# Patient Record
Sex: Male | Born: 1935
Health system: Southern US, Community
[De-identification: ages and names within clinical notes are randomized; demographics above are authoritative.]

## PROBLEM LIST (undated history)

## (undated) DIAGNOSIS — I4891 Unspecified atrial fibrillation: Secondary | ICD-10-CM

## (undated) DIAGNOSIS — E785 Hyperlipidemia, unspecified: Secondary | ICD-10-CM

## (undated) DIAGNOSIS — I350 Nonrheumatic aortic (valve) stenosis: Secondary | ICD-10-CM

## (undated) DIAGNOSIS — C61 Malignant neoplasm of prostate: Secondary | ICD-10-CM

## (undated) DIAGNOSIS — R011 Cardiac murmur, unspecified: Secondary | ICD-10-CM

## (undated) DIAGNOSIS — R7989 Other specified abnormal findings of blood chemistry: Secondary | ICD-10-CM

## (undated) DIAGNOSIS — E119 Type 2 diabetes mellitus without complications: Secondary | ICD-10-CM

## (undated) DIAGNOSIS — N319 Neuromuscular dysfunction of bladder, unspecified: Secondary | ICD-10-CM

## (undated) DIAGNOSIS — E539 Vitamin B deficiency, unspecified: Secondary | ICD-10-CM

## (undated) DIAGNOSIS — R600 Localized edema: Secondary | ICD-10-CM

## (undated) DIAGNOSIS — M545 Low back pain: Secondary | ICD-10-CM

## (undated) DIAGNOSIS — E782 Mixed hyperlipidemia: Secondary | ICD-10-CM

## (undated) HISTORY — DX: Neuromuscular dysfunction of bladder, unspecified: N31.9

## (undated) HISTORY — DX: Nonrheumatic aortic (valve) stenosis: I35.0

## (undated) HISTORY — DX: Mixed hyperlipidemia: E78.2

## (undated) HISTORY — DX: Low back pain: M54.5

## (undated) HISTORY — DX: Vitamin B deficiency, unspecified: E53.9

## (undated) HISTORY — PX: RADIAL HEAD ARTHROPLASTY: SUR75

## (undated) HISTORY — PX: ORIF HUMERUS FRACTURE: SHX2126

## (undated) HISTORY — DX: Localized edema: R60.0

## (undated) HISTORY — DX: Hyperlipidemia, unspecified: E78.5

## (undated) HISTORY — DX: Cardiac murmur, unspecified: R01.1

## (undated) HISTORY — DX: Other specified abnormal findings of blood chemistry: R79.89

---

## 1999-07-29 ENCOUNTER — Encounter: Payer: Self-pay | Admitting: Endocrinology

## 1999-07-29 ENCOUNTER — Encounter: Admission: RE | Admit: 1999-07-29 | Discharge: 1999-07-29 | Payer: Self-pay | Admitting: Endocrinology

## 1999-10-20 ENCOUNTER — Encounter (INDEPENDENT_AMBULATORY_CARE_PROVIDER_SITE_OTHER): Payer: Self-pay | Admitting: Specialist

## 1999-10-20 ENCOUNTER — Other Ambulatory Visit: Admission: RE | Admit: 1999-10-20 | Discharge: 1999-10-20 | Payer: Self-pay | Admitting: Urology

## 2007-05-30 ENCOUNTER — Inpatient Hospital Stay (HOSPITAL_COMMUNITY): Admission: RE | Admit: 2007-05-30 | Discharge: 2007-06-04 | Payer: Self-pay | Admitting: Orthopaedic Surgery

## 2007-07-17 ENCOUNTER — Encounter: Admission: RE | Admit: 2007-07-17 | Discharge: 2007-07-30 | Payer: Self-pay | Admitting: Orthopaedic Surgery

## 2010-05-18 NOTE — Discharge Summary (Signed)
NAME:  Parker [name], Parker [name]                  ACCOUNT NO.:  [id number]   MEDICAL RECORD NO.:  [id number]          PATIENT TYPE:  INP   LOCATION:  5157                         FACILITY:  [facility]   PHYSICIAN:  Mark C. Ophelia Charter, M.D.    DATE OF BIRTH:  [birth year]   DATE OF ADMISSION:  05/30/2007  DATE OF DISCHARGE:  06/04/2007                               DISCHARGE SUMMARY   TRANSFER SUMMARY   PRIMARY CARE PHYSICIAN:  Alfonse Alpers. Gegick, MD   FINAL DIAGNOSES:  1. Bilateral upper extremity fractures with right distal radius      fracture, status post reduction.  2. Left transcondylar humerus fracture with intercondylar extension      and comminuted radial head fracture, status post open reduction and      internal fixation of distal humerus with lateral plating and radial      head replacement arthroplasty, it was on May 30, 2007.   ADDITIONAL DIAGNOSES:  1. History of neurogenic bladder with home self-catheterization      program.  2. Likely urinary tract infection on admission.  3. Type 2 diabetes, diet-controlled.   This is an 77 year old male, tripped in the rain, trying to hurry to get  out of the rain [place] was [place].  He tends  to go with a shuffled gait.  He was treated in [place], [place],  where x-rays demonstrated distal radius fracture, which was reduced,  placed in a sugar-tong and he was placed in a posterior splint after CT  scan showed comminution of the distal humerus, comminution of the radial  head.  His son knows me and arranged for him to be seen in my office.   ADMISSION MEDICATIONS:  1. Lupron every 4 months.  2. Actos 45 mg daily.  3. Crestor 5 mg daily.  4. Doxazosin 2 mg p.o. daily.   HOSPITAL COURSE:  The patient was seen in my office and admitted for  surgery.  He underwent lateral plating on the left distal humerus with  fixation of interfragmentary component of the fracture and a radial head  arthroplasty.  Because the patient  had been on home self-cath program, a  Foley catheter was placed since he had involvement of both upper  extremities.  He was treated with arm elevation.  Neurologic exam after  his surgery on the left was stable.  No surgical treatment was performed  on the right since x-rays in the office demonstrate satisfactory  position and alignment.  He is moving his fingers well.  Radial, median,  and ulnar nerves are working well.  X-ray showed good position and  alignment of the radial head prosthesis.  Arrangements were made for  transfer to nursing home since he lives alone until he is able to resume  self-cath program and activities of daily living.  Brace is ordered for  range of motion from 30-120 degrees hinge towards elbow.  Right distal  radius fracture was in a sugar-tong, and he is to be seen in the office  on June 05, 2007, for repeat  x-rays and probable short arm fiberglass  cast application.   LABORATORY DATA:  UA and C&S, which were done on June 04, 2007, and those  results are pending.   CONDITION AT TRANSFER:  Satisfactory.   FINAL DIAGNOSES:  1. Bilateral upper extremity fractures.  2. Neurogenic bladder.  3. Diabetes.  4. Probable urinary tract infection.      Mark C. Ophelia Charter, M.D.  Electronically Signed     MCY/MEDQ  D:  06/04/2007  T:  06/04/2007  Job:  [id number]   cc:   Alfonse Alpers. Dagoberto Ligas, M.D.

## 2010-05-18 NOTE — Op Note (Signed)
NAME:  Parker [name], Parker [name]                  ACCOUNT NO.:  [id number]   MEDICAL RECORD NO.:  [id number]          PATIENT TYPE:  OIB   LOCATION:  5157                         FACILITY:  [facility]   PHYSICIAN:  Mark C. Ophelia Charter, M.D.    DATE OF BIRTH:  January 20, 1923   DATE OF PROCEDURE:  05/30/2007  DATE OF DISCHARGE:                               OPERATIVE REPORT   PREOPERATIVE DIAGNOSES:  1. Left transcondylar distal humerus fracture with supracondylar      extension.  2. Comminuted radial head fracture.   POSTOPERATIVE DIAGNOSES:  1. Left transcondylar distal humerus fracture with supracondylar      extension.  2. Comminuted radial head fracture.   PROCEDURES:  1. Open reduction and internal fixation of transcondylar distal      humerus fracture with intercondylar extension.  2. Radial head arthroplasty.   SURGEON:  Mark C. Ophelia Charter, MD   ANESTHESIA:  GOT plus Marcaine skin local 10 mL.   TOURNIQUET TIME:  One hour and 30 minutes.   DRAINS:  One Hemovac.   BRIEF HISTORY:  This 77 year old male lives up in [place]  and was  trying to run to get out of the rain.  He normally has a shuffled gait,  tripped and fell breaking his right distal radius, which [place] with good position alignment.  On the left side, he had  a distal humerus fracture with the capitellum being a separate piece and  extension over to the trochlea and separate pieces of trochlear groove  with extension along the lateral condylar ridge to the supracondylar  region.  Radial head was in multiple pieces with depression of about 8  mm.   PROCEDURES:  After induction of general anesthesia, orotracheal  intubation, proximal arm tourniquet, preoperative 2 g of Ancef  prophylaxis and prepped with DuraPrep from the fingertips up to the  tourniquet using extremity sheets, drapes, and towel clip and extremity  drape was applied.  Sterile skin marker was applied for a lateral elbow  incision and  Betadine Biodrape was applied.  Time-out checklist was  performed.  Arm was wrapped in Esmarch tourniquet inflated to 250 mmHg.  Incision was made laterally following the lateral humeral ridge to the  lateral epicondyle and then over the radial head splitting the muscle in  line with the radial head.  Radial head fragment was medially  identified.  Synovium was gently retracted.  The wound is irrigated and  there was half the radial head which was not depressed and this was used  for appropriate orientation alignment to resect the radial head, taking  about 10 mm.  This got down to the area below the head on the neck where  there was no fracture involvement.  This was resected.  The joint was  exposed.  Anterior synovium was divided and narrow small Hohmann was  placed across anterior aspect of the joint with elbow flexed and the  joint was well visualized.  Capitellar piece was rotated anteriorly,  flipped and this was meticulously cleaned along the edges and then  reduced.  There were some involvement of the trochlear groove and  extension went across transcondylar to the opposite side.  Once the  capitellar pieces were reduced, 2 K-wires were placed and then two 4-0  Synthes cortical screws were lagged across compressing this and  extending all the way across to the medial epicondyle.  Screw measured  exactly 50 and there was concern about the ulnar nerve in the groove,  great care was taken at the screw just partially went to the cortex and  screw was used to push through and there was a careful visualization  under fluoroscopy and the screw did not penetrate into the groove, but  did just come to the cortex and appeared to be flushed with cortex.  Palpation of the groove with the depth gauge where the ulnar nerve was  located as well as where the screw was being inserted was performed.  With these 2 screws placed, an Acumed anatomic lateral condylar plate  blue 6-hole was selected,  and screws were placed with proximal locking  screws distal nonlocking screws, so that they could be angled to grab  the capitellar fragment.  Distalmost screw in the lateral plate appeared  to penetrate the joint by x-ray and the anterior aspect of the humerus  was carefully inspected.  There was no penetration of the joint.  Alignment was satisfactory.  Elbow was taken through flexion and  extension.  There was no crepitus and there was direct visualization of  the anterior aspect of the distal humerus completely across.  Palpation  of the fingertip was performed as well.  Satisfied all screws were in  good position, radial head was then broached, tensioned, and appeared  that an 8 tension with a 26-mm head was appropriate.  The patient's head  would not quite fit into the 26 slot, so the 26 is slightly smaller than  the patient's own radial head.  After appropriate broaching for any  stem, the prosthesis was inserted and popped into place with some  difficulty.  Flexion, extension and rotation showed good stability.  After irrigation, the extensors were repaired.  Capsule was repaired.  Extensor origin was repaired using 0 Vicryl figure-of-eight interrupted,  2-0 Vicryl for subcutaneous tissue, after Hemovac was placed and a skin  staple closure, Marcaine infiltration both into the elbow joint 8 mL as  well as into the skin.  Xeroform, 4 x 4s, sterile Webril was applied.  The laser score mark on the prosthesis was carefully lined up with  Lister's tubercle at the time of insertion, and the neck cut had been  reamed with the calcar-type reamer.  The patient tolerated the procedure  well and transferred to the recovery room in stable condition.      Mark C. Ophelia Charter, M.D.  Electronically Signed     MCY/MEDQ  D:  05/30/2007  T:  05/31/2007  Job:  [id number]

## 2010-09-29 LAB — BASIC METABOLIC PANEL
BUN: 18
CO2: 24
Calcium: 8.7
Chloride: 103
Creatinine, Ser: 0.76
GFR calc Af Amer: >60
GFR calc non Af Amer: >60
Glucose, Bld: 115 — ABNORMAL HIGH
Potassium: 4
Sodium: 136

## 2010-09-29 LAB — CBC
HCT: 36.9 — ABNORMAL LOW
Hemoglobin: 12.5 — ABNORMAL LOW
MCHC: 34
MCV: 95.1
Platelets: 139 — ABNORMAL LOW
RBC: 3.88 — ABNORMAL LOW
RDW: 14.7
WBC: 11 — ABNORMAL HIGH

## 2010-09-29 LAB — URINE MICROSCOPIC-ADD ON

## 2010-09-29 LAB — DIFFERENTIAL
Basophils Relative: 0
Eosinophils Absolute: 0
Eosinophils Relative: 0
Monocytes Absolute: 1.1 — ABNORMAL HIGH
Monocytes Relative: 10
Neutrophils Relative %: 82 — ABNORMAL HIGH

## 2010-09-29 LAB — URINALYSIS, ROUTINE W REFLEX MICROSCOPIC
Bilirubin Urine: NEGATIVE
Glucose, UA: NEGATIVE
Specific Gravity, Urine: 1.025
pH: 5

## 2010-09-30 LAB — URINALYSIS, ROUTINE W REFLEX MICROSCOPIC
Bilirubin Urine: NEGATIVE
Nitrite: POSITIVE — AB
Specific Gravity, Urine: 1.01
pH: 7.5

## 2010-09-30 LAB — URINE MICROSCOPIC-ADD ON

## 2010-09-30 LAB — URINE CULTURE

## 2014-01-06 DIAGNOSIS — M6281 Muscle weakness (generalized): Secondary | ICD-10-CM | POA: Diagnosis not present

## 2014-01-06 DIAGNOSIS — R296 Repeated falls: Secondary | ICD-10-CM | POA: Diagnosis not present

## 2014-01-06 DIAGNOSIS — R278 Other lack of coordination: Secondary | ICD-10-CM | POA: Diagnosis not present

## 2014-01-06 DIAGNOSIS — R260 Ataxic gait: Secondary | ICD-10-CM | POA: Diagnosis not present

## 2014-01-08 DIAGNOSIS — R296 Repeated falls: Secondary | ICD-10-CM | POA: Diagnosis not present

## 2014-01-08 DIAGNOSIS — R260 Ataxic gait: Secondary | ICD-10-CM | POA: Diagnosis not present

## 2014-01-08 DIAGNOSIS — M6281 Muscle weakness (generalized): Secondary | ICD-10-CM | POA: Diagnosis not present

## 2014-01-08 DIAGNOSIS — R278 Other lack of coordination: Secondary | ICD-10-CM | POA: Diagnosis not present

## 2014-01-13 DIAGNOSIS — R260 Ataxic gait: Secondary | ICD-10-CM | POA: Diagnosis not present

## 2014-01-13 DIAGNOSIS — C61 Malignant neoplasm of prostate: Secondary | ICD-10-CM | POA: Diagnosis not present

## 2014-01-13 DIAGNOSIS — R278 Other lack of coordination: Secondary | ICD-10-CM | POA: Diagnosis not present

## 2014-01-13 DIAGNOSIS — M6281 Muscle weakness (generalized): Secondary | ICD-10-CM | POA: Diagnosis not present

## 2014-01-13 DIAGNOSIS — N312 Flaccid neuropathic bladder, not elsewhere classified: Secondary | ICD-10-CM | POA: Diagnosis not present

## 2014-01-13 DIAGNOSIS — R296 Repeated falls: Secondary | ICD-10-CM | POA: Diagnosis not present

## 2014-01-15 DIAGNOSIS — M6281 Muscle weakness (generalized): Secondary | ICD-10-CM | POA: Diagnosis not present

## 2014-01-15 DIAGNOSIS — R260 Ataxic gait: Secondary | ICD-10-CM | POA: Diagnosis not present

## 2014-01-15 DIAGNOSIS — R278 Other lack of coordination: Secondary | ICD-10-CM | POA: Diagnosis not present

## 2014-01-15 DIAGNOSIS — R296 Repeated falls: Secondary | ICD-10-CM | POA: Diagnosis not present

## 2014-01-21 DIAGNOSIS — R260 Ataxic gait: Secondary | ICD-10-CM | POA: Diagnosis not present

## 2014-01-21 DIAGNOSIS — M6281 Muscle weakness (generalized): Secondary | ICD-10-CM | POA: Diagnosis not present

## 2014-01-21 DIAGNOSIS — R296 Repeated falls: Secondary | ICD-10-CM | POA: Diagnosis not present

## 2014-01-21 DIAGNOSIS — R278 Other lack of coordination: Secondary | ICD-10-CM | POA: Diagnosis not present

## 2014-02-26 DIAGNOSIS — R339 Retention of urine, unspecified: Secondary | ICD-10-CM | POA: Diagnosis not present

## 2014-03-03 DIAGNOSIS — Z1389 Encounter for screening for other disorder: Secondary | ICD-10-CM | POA: Diagnosis not present

## 2014-03-03 DIAGNOSIS — Z0000 Encounter for general adult medical examination without abnormal findings: Secondary | ICD-10-CM | POA: Diagnosis not present

## 2014-03-03 DIAGNOSIS — E785 Hyperlipidemia, unspecified: Secondary | ICD-10-CM | POA: Diagnosis not present

## 2014-03-03 DIAGNOSIS — E538 Deficiency of other specified B group vitamins: Secondary | ICD-10-CM | POA: Diagnosis not present

## 2014-03-03 DIAGNOSIS — E139 Other specified diabetes mellitus without complications: Secondary | ICD-10-CM | POA: Diagnosis not present

## 2014-03-03 DIAGNOSIS — Z23 Encounter for immunization: Secondary | ICD-10-CM | POA: Diagnosis not present

## 2014-03-03 DIAGNOSIS — R296 Repeated falls: Secondary | ICD-10-CM | POA: Diagnosis not present

## 2014-04-16 DIAGNOSIS — H02105 Unspecified ectropion of left lower eyelid: Secondary | ICD-10-CM | POA: Diagnosis not present

## 2014-04-16 DIAGNOSIS — H02102 Unspecified ectropion of right lower eyelid: Secondary | ICD-10-CM | POA: Diagnosis not present

## 2014-05-19 DIAGNOSIS — N312 Flaccid neuropathic bladder, not elsewhere classified: Secondary | ICD-10-CM | POA: Diagnosis not present

## 2014-05-19 DIAGNOSIS — C61 Malignant neoplasm of prostate: Secondary | ICD-10-CM | POA: Diagnosis not present

## 2014-07-27 DIAGNOSIS — R52 Pain, unspecified: Secondary | ICD-10-CM | POA: Diagnosis not present

## 2014-07-27 DIAGNOSIS — T148 Other injury of unspecified body region: Secondary | ICD-10-CM | POA: Diagnosis not present

## 2014-07-28 ENCOUNTER — Emergency Department (HOSPITAL_COMMUNITY)
Admission: EM | Admit: 2014-07-28 | Discharge: 2014-07-28 | Disposition: A | Discharge status: Home / Self Care | Payer: Commercial Managed Care - HMO | Attending: Emergency Medicine | Admitting: Emergency Medicine

## 2014-07-28 ENCOUNTER — Emergency Department (HOSPITAL_COMMUNITY): Payer: Commercial Managed Care - HMO

## 2014-07-28 ENCOUNTER — Encounter (HOSPITAL_COMMUNITY): Payer: Self-pay | Admitting: Emergency Medicine

## 2014-07-28 DIAGNOSIS — R410 Disorientation, unspecified: Secondary | ICD-10-CM | POA: Insufficient documentation

## 2014-07-28 DIAGNOSIS — Z043 Encounter for examination and observation following other accident: Secondary | ICD-10-CM | POA: Insufficient documentation

## 2014-07-28 DIAGNOSIS — Y999 Unspecified external cause status: Secondary | ICD-10-CM | POA: Diagnosis not present

## 2014-07-28 DIAGNOSIS — E119 Type 2 diabetes mellitus without complications: Secondary | ICD-10-CM | POA: Insufficient documentation

## 2014-07-28 DIAGNOSIS — W1839XA Other fall on same level, initial encounter: Secondary | ICD-10-CM | POA: Insufficient documentation

## 2014-07-28 DIAGNOSIS — N390 Urinary tract infection, site not specified: Secondary | ICD-10-CM | POA: Diagnosis not present

## 2014-07-28 DIAGNOSIS — W19XXXA Unspecified fall, initial encounter: Secondary | ICD-10-CM

## 2014-07-28 DIAGNOSIS — Z87891 Personal history of nicotine dependence: Secondary | ICD-10-CM | POA: Diagnosis not present

## 2014-07-28 DIAGNOSIS — Z8546 Personal history of malignant neoplasm of prostate: Secondary | ICD-10-CM | POA: Insufficient documentation

## 2014-07-28 DIAGNOSIS — S199XXA Unspecified injury of neck, initial encounter: Secondary | ICD-10-CM | POA: Diagnosis not present

## 2014-07-28 DIAGNOSIS — Y939 Activity, unspecified: Secondary | ICD-10-CM | POA: Insufficient documentation

## 2014-07-28 DIAGNOSIS — Y9289 Other specified places as the place of occurrence of the external cause: Secondary | ICD-10-CM | POA: Diagnosis not present

## 2014-07-28 DIAGNOSIS — S0990XA Unspecified injury of head, initial encounter: Secondary | ICD-10-CM | POA: Diagnosis not present

## 2014-07-28 HISTORY — DX: Type 2 diabetes mellitus without complications: E11.9

## 2014-07-28 HISTORY — DX: Malignant neoplasm of prostate: C61

## 2014-07-28 LAB — URINALYSIS, ROUTINE W REFLEX MICROSCOPIC
Bilirubin Urine: NEGATIVE
Glucose, UA: NEGATIVE mg/dL
Ketones, ur: NEGATIVE mg/dL
NITRITE: POSITIVE — AB
PH: 5 (ref 5.0–8.0)
Protein, ur: NEGATIVE mg/dL
SPECIFIC GRAVITY, URINE: 1.009 (ref 1.005–1.030)
UROBILINOGEN UA: 0.2 mg/dL (ref 0.0–1.0)

## 2014-07-28 LAB — COMPREHENSIVE METABOLIC PANEL
ALBUMIN: 3.8 g/dL (ref 3.5–5.0)
ALK PHOS: 50 U/L (ref 38–126)
ALT: 9 U/L — AB (ref 17–63)
ANION GAP: 15 (ref 5–15)
AST: 19 U/L (ref 15–41)
BILIRUBIN TOTAL: 0.2 mg/dL — AB (ref 0.3–1.2)
BUN: 23 mg/dL — ABNORMAL HIGH (ref 6–20)
CALCIUM: 8.7 mg/dL — AB (ref 8.9–10.3)
CHLORIDE: 100 mmol/L — AB (ref 101–111)
CO2: 23 mmol/L (ref 22–32)
Creatinine, Ser: 0.92 mg/dL (ref 0.61–1.24)
GFR calc Af Amer: >60 mL/min (ref >60)
GLUCOSE: 122 mg/dL — AB (ref 65–99)
Potassium: 3.7 mmol/L (ref 3.5–5.1)
SODIUM: 138 mmol/L (ref 135–145)
TOTAL PROTEIN: 6.6 g/dL (ref 6.5–8.1)

## 2014-07-28 LAB — URINE MICROSCOPIC-ADD ON

## 2014-07-28 MED ORDER — CEPHALEXIN 250 MG PO CAPS: 250.0000 mg | ORAL_CAPSULE | Freq: Four times a day (QID) | ORAL | Status: DC

## 2014-07-28 NOTE — ED Notes (Addendum)
Per EMS unobserved fall. Pt coming from [facility], staff told son he should be sent out for evaluation and that he might have a UTI. Per EMS resident has AMS but he is alert for his norm. VSS. SOn states pt is usually A&Ox4. PT placed in C collar by EMS. PT normally caths himself.

## 2014-07-28 NOTE — ED Provider Notes (Signed)
CSN: [id number]   Arrival date & time 07/28/14 0013  History  This chart was scribed for  Parker Rice, MD by Parker [name], ED Scribe. This patient was seen in room D32C/D32C and the patient's care was started at 12:36 AM.  Chief Complaint  Patient presents with  . Fall    HPI The history is provided by the patient, the EMS personnel and a relative. No language interpreter was used.  Level V caveat due to AMS.  Brought in by EMS from [facility], Parker [name] is a 81 y.o. male who presents to the Emergency Department complaining of an unwitnessed fall tonight. The pt "kind of" remembers falling. Per EMS the pt is at baseline mental status. Pt denies any pain. No anticoagulation per son.   Past Medical History  Diagnosis Date  . Prostate cancer   . Diabetes mellitus without complication     History reviewed. No pertinent past surgical history.  History reviewed. No pertinent family history.  History  Substance Use Topics  . Smoking status: Former Research scientist (life sciences)  . Smokeless tobacco: Not on file  . Alcohol Use: No     Review of Systems  Respiratory: Negative for shortness of breath.   Cardiovascular: Negative for chest pain.  Gastrointestinal: Negative for abdominal pain.  Musculoskeletal: Negative for arthralgias and neck pain.  Skin: Negative for wound.  Psychiatric/Behavioral: Positive for confusion.  All other systems reviewed and are negative.    Home Medications   Prior to Admission medications   Medication Sig Start Date End Date Taking? Authorizing Provider  cephALEXin (KEFLEX) 250 MG capsule Take 1 capsule (250 mg total) by mouth 4 (four) times daily. 07/28/14   Parker Rice, MD    Allergies  Review of patient's allergies indicates no known allergies.  Triage Vitals: BP 143/70 mmHg  Pulse 72  Resp 25  Ht 6' (1.829 m)  Wt 180 lb (81.647 kg)  BMI 24.41 kg/m2  SpO2 95%  Physical Exam  Constitutional: He appears well-developed and  well-nourished. No distress.  HENT:  Head: Normocephalic and atraumatic.  Mouth/Throat: Oropharynx is clear and moist.  No obvious injury.  Eyes: EOM are normal. Pupils are equal, round, and reactive to light.  Neck: Normal range of motion. Neck supple.  No posterior midline cervical tenderness to palpation.  Cardiovascular: Normal rate and regular rhythm.   Pulmonary/Chest: Effort normal and breath sounds normal. No respiratory distress. He has no wheezes. He has no rales.  Abdominal: Soft. Bowel sounds are normal.  Musculoskeletal: Normal range of motion. He exhibits no edema or tenderness.  Neurological: He is alert.  Oriented to person. Moves all extremities without deficit. Sensation is intact.  Skin: Skin is warm and dry. No rash noted. No erythema.  Psychiatric: He has a normal mood and affect. His behavior is normal.  Nursing note and vitals reviewed.   ED Course  Procedures   DIAGNOSTIC STUDIES: Oxygen Saturation is 95% on RA, normal by my interpretation.    COORDINATION OF CARE: 12:42 AM Discussed treatment plan with pt at bedside and pt agreed to plan.  3:13 AM I re-evaluated the patient and provided an update on the results of his CT scans and labs. His son is at the bedside and notes that his father seems to be at his baseline mental status. He has had a UTI in the last year.   Labs Review-  Labs Reviewed  COMPREHENSIVE METABOLIC PANEL - Abnormal; Notable for the following:    Chloride  100 (*)    Glucose, Bld 122 (*)    BUN 23 (*)    Calcium 8.7 (*)    ALT 9 (*)    Total Bilirubin 0.2 (*)    All other components within normal limits  URINALYSIS, ROUTINE W REFLEX MICROSCOPIC (NOT AT [facility]) - Abnormal; Notable for the following:    APPearance CLOUDY (*)    Hgb urine dipstick TRACE (*)    Nitrite POSITIVE (*)    Leukocytes, UA LARGE (*)    All other components within normal limits  URINE MICROSCOPIC-ADD ON - Abnormal; Notable for the following:    Bacteria, UA  FEW (*)    All other components within normal limits  CBC WITH DIFFERENTIAL/PLATELET    Imaging Review Ct Head Wo Contrast  07/28/2014   CLINICAL DATA:  81 year old male post un-observed fall.  EXAM: CT HEAD WITHOUT CONTRAST  CT CERVICAL SPINE WITHOUT CONTRAST  TECHNIQUE: Multidetector CT imaging of the head and cervical spine was performed following the standard protocol without intravenous contrast. Multiplanar CT image reconstructions of the cervical spine were also generated.  COMPARISON:  None.  FINDINGS: CT HEAD FINDINGS  Generalized cerebral atrophy. Moderate chronic small vessel ischemia, asymmetrically increased in the right frontal lobe. No intracranial hemorrhage, mass effect, or midline shift. No hydrocephalus. The basilar cisterns are patent. No evidence of territorial infarct. No intracranial fluid collection. Calvarium is intact. Included paranasal sinuses and mastoid air cells are well aerated.  CT CERVICAL SPINE FINDINGS  Cervical spine alignment is maintained. Vertebral body heights are preserved. There is no fracture. The dens is intact. There are no jumped or perched facets. Disc space narrowing at C5-C6 and C6-C7 with associated endplate spurs. The bones are under mineralized. No prevertebral soft tissue edema. Atherosclerosis of the carotid vasculature, as well as aortic arch and its branches, partially included.  IMPRESSION: 1. Generalized atrophy and chronic small vessel ischemic change without CT findings of acute intracranial abnormality. 2. Mild degenerative change in the cervical spine, no fracture or subluxation.   Electronically Signed   By: [name] M.D.   On: 07/28/2014 02:00   Ct Cervical Spine Wo Contrast  07/28/2014   CLINICAL DATA:  81 year old male post un-observed fall.  EXAM: CT HEAD WITHOUT CONTRAST  CT CERVICAL SPINE WITHOUT CONTRAST  TECHNIQUE: Multidetector CT imaging of the head and cervical spine was performed following the standard protocol without  intravenous contrast. Multiplanar CT image reconstructions of the cervical spine were also generated.  COMPARISON:  None.  FINDINGS: CT HEAD FINDINGS  Generalized cerebral atrophy. Moderate chronic small vessel ischemia, asymmetrically increased in the right frontal lobe. No intracranial hemorrhage, mass effect, or midline shift. No hydrocephalus. The basilar cisterns are patent. No evidence of territorial infarct. No intracranial fluid collection. Calvarium is intact. Included paranasal sinuses and mastoid air cells are well aerated.  CT CERVICAL SPINE FINDINGS  Cervical spine alignment is maintained. Vertebral body heights are preserved. There is no fracture. The dens is intact. There are no jumped or perched facets. Disc space narrowing at C5-C6 and C6-C7 with associated endplate spurs. The bones are under mineralized. No prevertebral soft tissue edema. Atherosclerosis of the carotid vasculature, as well as aortic arch and its branches, partially included.  IMPRESSION: 1. Generalized atrophy and chronic small vessel ischemic change without CT findings of acute intracranial abnormality. 2. Mild degenerative change in the cervical spine, no fracture or subluxation.   Electronically Signed   By: [name] M.D.   On:  07/28/2014 02:00    EKG Interpretation  Date/Time:    Ventricular Rate:    PR Interval:    QRS Duration:   QT Interval:    QTC Calculation:   R Axis:     Text Interpretation:         MDM   Final diagnoses:  Fall, initial encounter  UTI (lower urinary tract infection)     I personally performed the services described in this documentation, which was scribed in my presence. The recorded information has been reviewed and is accurate.  Patient has at his baseline. No obvious injury on CT head and cervical spine. We'll treat for urinary tract infection. Return precautions given.    Parker Rice, MD 07/29/14 [phone]

## 2014-07-28 NOTE — Discharge Instructions (Signed)
Fall Prevention and Home Safety Falls cause injuries and can affect all age groups. It is possible to use preventive measures to significantly decrease the likelihood of falls. There are many simple measures which can make your home safer and prevent falls. OUTDOORS  Repair cracks and edges of walkways and driveways.  Remove high doorway thresholds.  Trim shrubbery on the main path into your home.  Have good outside lighting.  Clear walkways of tools, rocks, debris, and clutter.  Check that handrails are not broken and are securely fastened. Both sides of steps should have handrails.  Have leaves, snow, and ice cleared regularly.  Use sand or salt on walkways during winter months.  In the garage, clean up grease or oil spills. BATHROOM  Install night lights.  Install grab bars by the toilet and in the tub and shower.  Use non-skid mats or decals in the tub or shower.  Place a plastic non-slip stool in the shower to sit on, if needed.  Keep floors dry and clean up all water on the floor immediately.  Remove soap buildup in the tub or shower on a regular basis.  Secure bath mats with non-slip, double-sided rug tape.  Remove throw rugs and tripping hazards from the floors. BEDROOMS  Install night lights.  Make sure a bedside light is easy to reach.  Do not use oversized bedding.  Keep a telephone by your bedside.  Have a firm chair with side arms to use for getting dressed.  Remove throw rugs and tripping hazards from the floor. KITCHEN  Keep handles on pots and pans turned toward the center of the stove. Use back burners when possible.  Clean up spills quickly and allow time for drying.  Avoid walking on wet floors.  Avoid hot utensils and knives.  Position shelves so they are not too high or low.  Place commonly used objects within easy reach.  If necessary, use a sturdy step stool with a grab bar when reaching.  Keep electrical cables out of the  way.  Do not use floor polish or wax that makes floors slippery. If you must use wax, use non-skid floor wax.  Remove throw rugs and tripping hazards from the floor. STAIRWAYS  Never leave objects on stairs.  Place handrails on both sides of stairways and use them. Fix any loose handrails. Make sure handrails on both sides of the stairways are as long as the stairs.  Check carpeting to make sure it is firmly attached along stairs. Make repairs to worn or loose carpet promptly.  Avoid placing throw rugs at the top or bottom of stairways, or properly secure the rug with carpet tape to prevent slippage. Get rid of throw rugs, if possible.  Have an electrician put in a light switch at the top and bottom of the stairs. OTHER FALL PREVENTION TIPS  Wear low-heel or rubber-soled shoes that are supportive and fit well. Wear closed toe shoes.  When using a stepladder, make sure it is fully opened and both spreaders are firmly locked. Do not climb a closed stepladder.  Add color or contrast paint or tape to grab bars and handrails in your home. Place contrasting color strips on first and last steps.  Learn and use mobility aids as needed. Install an electrical emergency response system.  Turn on lights to avoid dark areas. Replace light bulbs that burn out immediately. Get light switches that glow.  Arrange furniture to create clear pathways. Keep furniture in the same place.  Firmly attach carpet with non-skid or double-sided tape.  Eliminate uneven floor surfaces.  Select a carpet pattern that does not visually hide the edge of steps.  Be aware of all pets. OTHER HOME SAFETY TIPS  Set the water temperature for 120 F (48.8 C).  Keep emergency numbers on or near the telephone.  Keep smoke detectors on every level of the home and near sleeping areas. Document Released: 12/10/2001 Document Revised: 06/21/2011 Document Reviewed: 03/11/2011 [facility] Patient Information 2015  [place], [place]. This information is not intended to replace advice given to you by your health care provider. Make sure you discuss any questions you have with your health care provider.  Urinary Tract Infection A urinary tract infection (UTI) can occur any place along the urinary tract. The tract includes the kidneys, ureters, bladder, and urethra. A type of germ called bacteria often causes a UTI. UTIs are often helped with antibiotic medicine.  HOME CARE   If given, take antibiotics as told by your doctor. Finish them even if you start to feel better.  Drink enough fluids to keep your pee (urine) clear or pale yellow.  Avoid tea, drinks with caffeine, and bubbly (carbonated) drinks.  Pee often. Avoid holding your pee in for a long time.  Pee before and after having sex (intercourse).  Wipe from front to back after you poop (bowel movement) if you are a woman. Use each tissue only once. GET HELP RIGHT AWAY IF:   You have back pain.  You have lower belly (abdominal) pain.  You have chills.  You feel sick to your stomach (nauseous).  You throw up (vomit).  Your burning or discomfort with peeing does not go away.  You have a fever.  Your symptoms are not better in 3 days. MAKE SURE YOU:   Understand these instructions.  Will watch your condition.  Will get help right away if you are not doing well or get worse. Document Released: 06/08/2007 Document Revised: 09/14/2011 Document Reviewed: 07/21/2011 [facility] Patient Information 2015 [place], [place]. This information is not intended to replace advice given to you by your health care provider. Make sure you discuss any questions you have with your health care provider.

## 2014-09-01 DIAGNOSIS — R600 Localized edema: Secondary | ICD-10-CM | POA: Insufficient documentation

## 2014-09-01 DIAGNOSIS — R7989 Other specified abnormal findings of blood chemistry: Secondary | ICD-10-CM | POA: Insufficient documentation

## 2014-09-01 DIAGNOSIS — E8770 Fluid overload, unspecified: Secondary | ICD-10-CM | POA: Insufficient documentation

## 2014-09-01 DIAGNOSIS — E119 Type 2 diabetes mellitus without complications: Secondary | ICD-10-CM | POA: Diagnosis not present

## 2014-09-01 HISTORY — DX: Other specified abnormal findings of blood chemistry: R79.89

## 2014-09-05 ENCOUNTER — Other Ambulatory Visit (HOSPITAL_COMMUNITY): Payer: Self-pay | Admitting: Internal Medicine

## 2014-09-05 DIAGNOSIS — R011 Cardiac murmur, unspecified: Secondary | ICD-10-CM

## 2014-09-15 ENCOUNTER — Ambulatory Visit (HOSPITAL_COMMUNITY): Payer: Commercial Managed Care - HMO | Attending: Cardiovascular Disease

## 2014-09-15 ENCOUNTER — Other Ambulatory Visit: Payer: Self-pay

## 2014-09-15 DIAGNOSIS — I352 Nonrheumatic aortic (valve) stenosis with insufficiency: Secondary | ICD-10-CM | POA: Insufficient documentation

## 2014-09-15 DIAGNOSIS — I517 Cardiomegaly: Secondary | ICD-10-CM | POA: Diagnosis not present

## 2014-09-15 DIAGNOSIS — R011 Cardiac murmur, unspecified: Secondary | ICD-10-CM | POA: Diagnosis not present

## 2014-09-15 DIAGNOSIS — Z87891 Personal history of nicotine dependence: Secondary | ICD-10-CM | POA: Diagnosis not present

## 2014-09-15 DIAGNOSIS — C61 Malignant neoplasm of prostate: Secondary | ICD-10-CM | POA: Diagnosis not present

## 2014-09-15 DIAGNOSIS — E119 Type 2 diabetes mellitus without complications: Secondary | ICD-10-CM | POA: Diagnosis not present

## 2014-09-23 DIAGNOSIS — C61 Malignant neoplasm of prostate: Secondary | ICD-10-CM | POA: Diagnosis not present

## 2014-10-07 DIAGNOSIS — C61 Malignant neoplasm of prostate: Secondary | ICD-10-CM | POA: Diagnosis not present

## 2014-10-13 DIAGNOSIS — H2511 Age-related nuclear cataract, right eye: Secondary | ICD-10-CM | POA: Diagnosis not present

## 2014-10-13 DIAGNOSIS — Z961 Presence of intraocular lens: Secondary | ICD-10-CM | POA: Diagnosis not present

## 2014-10-28 ENCOUNTER — Other Ambulatory Visit: Payer: Self-pay | Admitting: *Deleted

## 2014-10-28 ENCOUNTER — Encounter: Payer: Self-pay | Admitting: *Deleted

## 2014-10-28 DIAGNOSIS — R609 Edema, unspecified: Secondary | ICD-10-CM | POA: Insufficient documentation

## 2014-10-28 DIAGNOSIS — E119 Type 2 diabetes mellitus without complications: Secondary | ICD-10-CM | POA: Insufficient documentation

## 2014-10-28 DIAGNOSIS — B029 Zoster without complications: Secondary | ICD-10-CM | POA: Insufficient documentation

## 2014-10-28 DIAGNOSIS — E782 Mixed hyperlipidemia: Secondary | ICD-10-CM

## 2014-10-28 DIAGNOSIS — M545 Low back pain, unspecified: Secondary | ICD-10-CM

## 2014-10-28 DIAGNOSIS — E139 Other specified diabetes mellitus without complications: Secondary | ICD-10-CM | POA: Insufficient documentation

## 2014-10-28 DIAGNOSIS — E785 Hyperlipidemia, unspecified: Secondary | ICD-10-CM

## 2014-10-28 HISTORY — DX: Mixed hyperlipidemia: E78.2

## 2014-10-28 HISTORY — DX: Hyperlipidemia, unspecified: E78.5

## 2014-10-28 HISTORY — DX: Low back pain, unspecified: M54.50

## 2014-10-30 ENCOUNTER — Ambulatory Visit: Payer: Self-pay | Admitting: Cardiovascular Disease

## 2014-11-17 ENCOUNTER — Encounter: Payer: Self-pay | Admitting: Cardiovascular Disease

## 2014-11-17 ENCOUNTER — Ambulatory Visit (INDEPENDENT_AMBULATORY_CARE_PROVIDER_SITE_OTHER): Payer: Commercial Managed Care - HMO | Admitting: Cardiovascular Disease

## 2014-11-17 VITALS — BP 98/70 | HR 105 | Ht 72.0 in | Wt 217.1 lb

## 2014-11-17 DIAGNOSIS — I481 Persistent atrial fibrillation: Secondary | ICD-10-CM | POA: Diagnosis not present

## 2014-11-17 DIAGNOSIS — I4891 Unspecified atrial fibrillation: Secondary | ICD-10-CM | POA: Diagnosis not present

## 2014-11-17 DIAGNOSIS — I350 Nonrheumatic aortic (valve) stenosis: Secondary | ICD-10-CM | POA: Diagnosis not present

## 2014-11-17 DIAGNOSIS — I359 Nonrheumatic aortic valve disorder, unspecified: Secondary | ICD-10-CM | POA: Diagnosis not present

## 2014-11-17 DIAGNOSIS — I4819 Other persistent atrial fibrillation: Secondary | ICD-10-CM

## 2014-11-17 DIAGNOSIS — E785 Hyperlipidemia, unspecified: Secondary | ICD-10-CM

## 2014-11-17 LAB — CBC WITH DIFFERENTIAL/PLATELET
Basophils Absolute: 0.1 10*3/uL (ref 0.0–0.1)
Basophils Relative: 1 % (ref 0–1)
EOS ABS: 0.2 10*3/uL (ref 0.0–0.7)
EOS PCT: 2 % (ref 0–5)
HCT: 38 % — ABNORMAL LOW (ref 39.0–52.0)
Hemoglobin: 12.7 g/dL — ABNORMAL LOW (ref 13.0–17.0)
LYMPHS ABS: 1.1 10*3/uL (ref 0.7–4.0)
Lymphocytes Relative: 14 % (ref 12–46)
MCH: 31.6 pg (ref 26.0–34.0)
MCHC: 33.4 g/dL (ref 30.0–36.0)
MCV: 94.5 fL (ref 78.0–100.0)
MONO ABS: 1.1 10*3/uL — AB (ref 0.1–1.0)
MONOS PCT: 13 % — AB (ref 3–12)
MPV: 10.9 fL (ref 8.6–12.4)
NEUTROS PCT: 70 % (ref 43–77)
Neutro Abs: 5.7 10*3/uL (ref 1.7–7.7)
PLATELETS: 214 10*3/uL (ref 150–400)
RBC: 4.02 MIL/uL — ABNORMAL LOW (ref 4.22–5.81)
RDW: 13.5 % (ref 11.5–15.5)
WBC: 8.1 10*3/uL (ref 4.0–10.5)

## 2014-11-17 LAB — BASIC METABOLIC PANEL
BUN: 20 mg/dL (ref 7–25)
CALCIUM: 9.1 mg/dL (ref 8.6–10.3)
CO2: 30 mmol/L (ref 20–31)
CREATININE: 0.87 mg/dL (ref 0.70–1.11)
Chloride: 99 mmol/L (ref 98–110)
Glucose, Bld: 106 mg/dL — ABNORMAL HIGH (ref 65–99)
Potassium: 4.2 mmol/L (ref 3.5–5.3)
Sodium: 139 mmol/L (ref 135–146)

## 2014-11-17 MED ORDER — APIXABAN 5 MG PO TABS: 5.0000 mg | ORAL_TABLET | Freq: Two times a day (BID) | ORAL | Status: DC

## 2014-11-17 MED ORDER — METOPROLOL TARTRATE 25 MG PO TABS: 12.5000 mg | ORAL_TABLET | Freq: Two times a day (BID) | ORAL | Status: DC

## 2014-11-17 MED ORDER — DOXAZOSIN MESYLATE 2 MG PO TABS: 2.0000 mg | ORAL_TABLET | Freq: Every day | ORAL | Status: DC

## 2014-11-17 NOTE — Patient Instructions (Signed)
Medication Instructions:  DECREASE Cardura to 2 mg daily START Metoprolol 12.5 mg twice daily - take 12 hours apart START Eliquis 5 mg twice daily - take 12 hours apart   Labwork: TODAY - CBC, basic metabolic panel  Your physician recommends that you return for lab work in: 1 month - CBC, basic metabolic panel   Testing/Procedures: None Ordered   Follow-Up: Your physician recommends that you schedule a follow-up appointment in: 3 months with Dr. Acie Fredrickson    If you need a refill on your cardiac medications before your next appointment, please call your pharmacy.   Thank you for choosing [facility]! Christen Bame, RN [phone]

## 2014-11-17 NOTE — Progress Notes (Signed)
Cardiology Office Note   Date:  11/17/2014   ID:  Parker [name], DOB Aug 27, 1923, MRN [id number]  PCP:  Lottie Dawson, MD  Cardiologist:   Thayer Headings, MD   Chief Complaint  Patient presents with  . Aortic Stenosis   Problem list 1. Aortic stenosis 2. Diabetes mellitus 3. Hyperlipidemia 4. Prostate Cancer  5. Atrial fibrillation;     History of Present Illness: Parker [name] is a 81 y.o. male who presents for evaluation of aortic stenosis.   Was seen with his son , Parker [name].  Has had a heart murmur for year.  Able to do his normal activities without problem.  No PND or orthopnea .  Has some DOE ( noted by his son)  No CP or syncope.    Some of memory issues.     Past Medical History  Diagnosis Date  . Prostate cancer ([place])   . Diabetes mellitus without complication ([place])   . Dyslipidemia   . Cardiac murmur   . Bilateral leg edema   . Vitamin B deficiency   . Aortic stenosis   . Neurogenic bladder     self cath at home  . Hyperlipidemia 10/28/2014  . Combined fat and carbohydrate induced hyperlipemia 10/28/2014  . LBP (low back pain) 10/28/2014  . Elevated brain natriuretic peptide (BNP) level 09/01/2014    Past Surgical History  Procedure Laterality Date  . Orif humerus fracture      Open reduction and internal fixation of transcondylar distal humerus fracture with intercondylar extension   . Radial head arthroplasty       Current Outpatient Prescriptions  Medication Sig Dispense Refill  . Aspirin (ASPIR-81 PO) Take 81 mg by mouth daily.    . bicalutamide (CASODEX) 50 MG tablet Take 1 tablet by mouth daily.    Marland Kitchen doxazosin (CARDURA) 4 MG tablet Take 2 mg by mouth daily.    . furosemide (LASIX) 40 MG tablet Take 1 tablet by mouth daily.    Marland Kitchen Leuprolide Acetate, 6 Month, (LUPRON DEPOT) 45 MG injection Every 4 months    . metFORMIN (GLUCOPHAGE) 500 MG tablet Take 1 tablet by mouth 2 (two) times daily.    . pravastatin (PRAVACHOL) 10 MG tablet  Take 1 tablet by mouth daily.     No current facility-administered medications for this visit.    Allergies:   Review of patient's allergies indicates no known allergies.    Social History:  The patient  reports that he has quit smoking. He does not have any smokeless tobacco history on file. He reports that he does not drink alcohol or use illicit drugs.   Family History:  The patient's family history is not on file.    ROS:  Please see the history of present illness.    Review of Systems: Constitutional:  denies fever, chills, diaphoresis, appetite change and fatigue.  HEENT: denies photophobia, eye pain, redness, hearing loss, ear pain, congestion, sore throat, rhinorrhea, sneezing, neck pain, neck stiffness and tinnitus.  Respiratory: denies SOB, DOE, cough, chest tightness, and wheezing.  Cardiovascular: admits to  leg swelling.  Gastrointestinal: denies nausea, vomiting, abdominal pain, diarrhea, constipation, blood in stool.  Genitourinary: denies dysuria, urgency, frequency, hematuria, flank pain and difficulty urinating.  Musculoskeletal: denies  myalgias, back pain, joint swelling, arthralgias and gait problem.   Skin: denies pallor, rash and wound.  Neurological: denies dizziness, seizures, syncope, weakness, light-headedness, numbness and headaches.   Hematological: denies adenopathy, easy bruising, personal or family bleeding history.  Psychiatric/ Behavioral: denies suicidal ideation, mood changes, confusion, nervousness, sleep disturbance and agitation.       All other systems are reviewed and negative.    PHYSICAL EXAM: VS:  There were no vitals taken for this visit. , BMI There is no weight on file to calculate BMI. GEN: Well nourished, well developed, in no acute distress HEENT: normal Neck: no JVD, carotid bruits, or masses Cardiac: Irregularly irregular. He is mildly tachycardic.; no murmurs, rubs, or gallops,   1-2+ pitting edema bilaterally. Respiratory:   clear to auscultation bilaterally, normal work of breathing GI: soft, nontender, nondistended, + BS MS: no deformity or atrophy Skin: warm and dry, no rash Neuro:  Strength and sensation are intact Psych: normal   EKG:  EKG is ordered today. The ekg ordered today demonstrates  Atrial fib at rate of 105  NS ST ab.    Recent Labs: 07/28/2014: ALT 9*; BUN 23*; Creatinine, Ser 0.92; Potassium 3.7; Sodium 138    Lipid Panel No results found for: CHOL, TRIG, HDL, CHOLHDL, VLDL, LDLCALC, LDLDIRECT    Wt Readings from Last 3 Encounters:  07/28/14 180 lb (81.647 kg)      Other studies Reviewed: Additional studies/ records that were reviewed today include: . Review of the above records demonstrates:    ASSESSMENT AND PLAN:  1. Aortic stenosis - he has moderate aortic stenosis by echo. He doesn't really seem to have any specific symptoms related to aortic stenosis. He's generally weak and I'm not sure that he's limited by his aortic stenosis.  We discussed TAVR - I'm not sure that he is a candidate given his frail status.   Portion, his aortic stenosis is only moderate and we do not need to consider surgical options at this time.  2. Diabetes mellitus 3. Hyperlipidemia 4. Prostate Cancer - followed by Dr. Gaynelle Arabian.  Will decrease his cardura to 2 mg a day to allow his BP to increase slightly so that we can add metoprolol   5. Atrial fibrillation:  His CHADS2 VASC score is  3   (age > 81, DM)  His rate is slightly elevated. We will add metoprolol 12.5 g twice a day. We'll also add Eliquis  5 mg twice a day. We'll check a basic medical profile and CBC today and again in one month.  I'll see him in 3 months .    Current medicines are reviewed at length with the patient today.  The patient does not have concerns regarding medicines.  The following changes have been made:  no change  Labs/ tests ordered today include:  No orders of the defined types were placed in this  encounter.     Disposition:   FU with me in 3 months .      Nahser, Wonda Cheng, MD  11/17/2014 8:27 AM    [facility] [place], [place], [place]  [address] Phone: [phone]; Fax: [phone]   [facility]  [address] [place], [place]  [address] [phone]   Fax [phone]

## 2014-12-17 ENCOUNTER — Other Ambulatory Visit: Payer: Commercial Managed Care - HMO

## 2015-02-17 ENCOUNTER — Ambulatory Visit (INDEPENDENT_AMBULATORY_CARE_PROVIDER_SITE_OTHER): Payer: Commercial Managed Care - HMO | Admitting: Cardiovascular Disease

## 2015-02-17 ENCOUNTER — Other Ambulatory Visit (INDEPENDENT_AMBULATORY_CARE_PROVIDER_SITE_OTHER): Payer: Commercial Managed Care - HMO | Admitting: *Deleted

## 2015-02-17 ENCOUNTER — Encounter: Payer: Self-pay | Admitting: Cardiovascular Disease

## 2015-02-17 VITALS — BP 140/94 | HR 40 | Ht 72.0 in | Wt 212.8 lb

## 2015-02-17 DIAGNOSIS — I350 Nonrheumatic aortic (valve) stenosis: Secondary | ICD-10-CM

## 2015-02-17 DIAGNOSIS — I482 Chronic atrial fibrillation, unspecified: Secondary | ICD-10-CM

## 2015-02-17 DIAGNOSIS — I4891 Unspecified atrial fibrillation: Secondary | ICD-10-CM

## 2015-02-17 DIAGNOSIS — E785 Hyperlipidemia, unspecified: Secondary | ICD-10-CM

## 2015-02-17 DIAGNOSIS — I359 Nonrheumatic aortic valve disorder, unspecified: Secondary | ICD-10-CM | POA: Diagnosis not present

## 2015-02-17 LAB — CBC WITH DIFFERENTIAL/PLATELET
Basophils Absolute: 0.1 10*3/uL (ref 0.0–0.1)
Basophils Relative: 1 % (ref 0–1)
Eosinophils Absolute: 0.2 10*3/uL (ref 0.0–0.7)
Eosinophils Relative: 2 % (ref 0–5)
HEMATOCRIT: 38.4 % — AB (ref 39.0–52.0)
HEMOGLOBIN: 12.9 g/dL — AB (ref 13.0–17.0)
LYMPHS PCT: 19 % (ref 12–46)
Lymphs Abs: 1.4 10*3/uL (ref 0.7–4.0)
MCH: 32.1 pg (ref 26.0–34.0)
MCHC: 33.6 g/dL (ref 30.0–36.0)
MCV: 95.5 fL (ref 78.0–100.0)
MONO ABS: 0.8 10*3/uL (ref 0.1–1.0)
MONOS PCT: 10 % (ref 3–12)
MPV: 10.9 fL (ref 8.6–12.4)
NEUTROS ABS: 5.2 10*3/uL (ref 1.7–7.7)
Neutrophils Relative %: 68 % (ref 43–77)
Platelets: 199 10*3/uL (ref 150–400)
RBC: 4.02 MIL/uL — AB (ref 4.22–5.81)
RDW: 14.4 % (ref 11.5–15.5)
WBC: 7.6 10*3/uL (ref 4.0–10.5)

## 2015-02-17 LAB — BASIC METABOLIC PANEL
BUN: 24 mg/dL (ref 7–25)
CO2: 34 mmol/L — ABNORMAL HIGH (ref 20–31)
Calcium: 9.4 mg/dL (ref 8.6–10.3)
Chloride: 96 mmol/L — ABNORMAL LOW (ref 98–110)
Creat: 0.9 mg/dL (ref 0.70–1.11)
GLUCOSE: 98 mg/dL (ref 65–99)
POTASSIUM: 4.6 mmol/L (ref 3.5–5.3)
Sodium: 137 mmol/L (ref 135–146)

## 2015-02-17 MED ORDER — METOPROLOL SUCCINATE ER 25 MG PO TB24: 12.5000 mg | ORAL_TABLET | Freq: Every day | ORAL | Status: AC

## 2015-02-17 NOTE — Patient Instructions (Addendum)
Medication Instructions:  STOP Metoprolol Tartrate  START Metoprolol Succinate 12.5 mg (1/2 tab) daily  Labwork: None Ordered   Testing/Procedures: None Ordered   Follow-Up: Your physician recommends that you schedule a follow-up appointment in: 3 months with Dr. Acie Fredrickson    If you need a refill on your cardiac medications before your next appointment, please call your pharmacy.   Thank you for choosing [facility]! Christen Bame, RN [phone]

## 2015-02-17 NOTE — Progress Notes (Signed)
Cardiology Office Note   Date:  02/17/2015   ID:  Parker [name], DOB [birth year], MRN [id number]  PCP:  Parker Dawson, MD  Cardiologist:   Parker Headings, MD   Chief Complaint  Patient presents with  . Follow-up   Problem list 1. Aortic stenosis 2. Diabetes mellitus 3. Hyperlipidemia 4. Prostate Cancer  5. Atrial fibrillation;     History of Present Illness: Parker [name] is a 82 y.o. male who presents for evaluation of aortic stenosis.   Was seen with his son , Parker [name].  Has had a heart murmur for year.  Able to do his normal activities without problem.  No PND or orthopnea .  Has some DOE ( noted by his son)  No CP or syncope.    Some of memory issues.     Past Medical History  Diagnosis Date  . Prostate cancer ([place])   . Diabetes mellitus without complication ([place])   . Dyslipidemia   . Cardiac murmur   . Bilateral leg edema   . Vitamin B deficiency   . Aortic stenosis   . Neurogenic bladder     self cath at home  . Hyperlipidemia 10/28/2014  . Combined fat and carbohydrate induced hyperlipemia 10/28/2014  . LBP (low back pain) 10/28/2014  . Elevated brain natriuretic peptide (BNP) level 09/01/2014    Past Surgical History  Procedure Laterality Date  . Orif humerus fracture      Open reduction and internal fixation of transcondylar distal humerus fracture with intercondylar extension   . Radial head arthroplasty       Current Outpatient Prescriptions  Medication Sig Dispense Refill  . apixaban (ELIQUIS) 5 MG TABS tablet Take 1 tablet (5 mg total) by mouth 2 (two) times daily. 60 tablet 11  . Aspirin (ASPIR-81 PO) Take 81 mg by mouth daily.    . bicalutamide (CASODEX) 50 MG tablet Take 1 tablet by mouth daily.    . cyanocobalamin (,VITAMIN B-12,) 1000 MCG/ML injection Inject into the muscle every 30 (thirty) days.  3  . doxazosin (CARDURA) 2 MG tablet Take 1 tablet (2 mg total) by mouth daily. 90 tablet 3  . furosemide (LASIX) 40 MG tablet Take 1  tablet by mouth 2 (two) times daily.     Marland Kitchen Leuprolide Acetate, 6 Month, (LUPRON DEPOT) 45 MG injection Every 4 months    . metFORMIN (GLUCOPHAGE) 500 MG tablet Take 1 tablet by mouth 2 (two) times daily.    . metoprolol tartrate (LOPRESSOR) 25 MG tablet Take 0.5 tablets (12.5 mg total) by mouth 2 (two) times daily. 31 tablet 11  . pravastatin (PRAVACHOL) 10 MG tablet Take 1 tablet by mouth daily.     No current facility-administered medications for this visit.    Allergies:   Review of patient's allergies indicates no known allergies.    Social History:  The patient  reports that he has quit smoking. He does not have any smokeless tobacco history on file. He reports that he does not drink alcohol or use illicit drugs.   Family History:  The patient's family history is not on file.    ROS:  Please see the history of present illness.    Review of Systems: Constitutional:  denies fever, chills, diaphoresis, appetite change and fatigue.  HEENT: denies photophobia, eye pain, redness, hearing loss, ear pain, congestion, sore throat, rhinorrhea, sneezing, neck pain, neck stiffness and tinnitus.  Respiratory: denies SOB, DOE, cough, chest tightness, and wheezing.  Cardiovascular: admits to  leg swelling.  Gastrointestinal: denies nausea, vomiting, abdominal pain, diarrhea, constipation, blood in stool.  Genitourinary: denies dysuria, urgency, frequency, hematuria, flank pain and difficulty urinating.  Musculoskeletal: denies  myalgias, back pain, joint swelling, arthralgias and gait problem.   Skin: denies pallor, rash and wound.  Neurological: denies dizziness, seizures, syncope, weakness, light-headedness, numbness and headaches.   Hematological: denies adenopathy, easy bruising, personal or family bleeding history.  Psychiatric/ Behavioral: denies suicidal ideation, mood changes, confusion, nervousness, sleep disturbance and agitation.       All other systems are reviewed and negative.     PHYSICAL EXAM: VS:  BP 140/94 mmHg  Pulse 40  Ht 6' (1.829 m)  Wt 212 lb 12.8 oz (96.525 kg)  BMI 28.85 kg/m2 , BMI Body mass index is 28.85 kg/(m^2). GEN: Well nourished, well developed, in no acute distress HEENT: normal Neck: no JVD, carotid bruits, or masses Cardiac: Irregularly irregular. He is mildly tachycardic.; no murmurs, rubs, or gallops,   1-2+ pitting edema bilaterally. Respiratory:  clear to auscultation bilaterally, normal work of breathing GI: soft, nontender, nondistended, + BS MS: no deformity or atrophy Skin: warm and dry, no rash Neuro:  Strength and sensation are intact Psych: normal   EKG:  EKG is ordered today. The ekg ordered today demonstrates  Atrial fib at rate of 105  NS ST ab.    Recent Labs: 07/28/2014: ALT 9* 11/17/2014: BUN 20; Creat 0.87; Hemoglobin 12.7*; Platelets 214; Potassium 4.2; Sodium 139    Lipid Panel No results found for: CHOL, TRIG, HDL, CHOLHDL, VLDL, LDLCALC, LDLDIRECT    Wt Readings from Last 3 Encounters:  02/17/15 212 lb 12.8 oz (96.525 kg)  11/17/14 217 lb 1.9 oz (98.485 kg)  07/28/14 180 lb (81.647 kg)      Other studies Reviewed: Additional studies/ records that were reviewed today include: . Review of the above records demonstrates:    ASSESSMENT AND PLAN:  1. Aortic stenosis - he has moderate aortic stenosis by echo. He doesn't really seem to have any specific symptoms related to aortic stenosis. He's generally weak and I'm not sure that he's limited by his aortic stenosis.  We discussed TAVR - I'm not sure that he is a candidate given his frail status.     his aortic stenosis is only moderate and we do not need to consider surgical options at this time.  2. Diabetes mellitus 3. Hyperlipidemia 4. Prostate Cancer - followed by Dr. Gaynelle [name].  Will decrease his cardura to 2 mg a day to allow his BP to increase slightly so that we can add metoprolol   5. Atrial fibrillation:  His CHADS2 VASC score is  3    (age > 26, DM)   His heart rate was elevated at his last office visit and we started metoprolol 12.5 mg twice a day. His heart rate went from 105 down to 40. We will stop the metoprolol tartrate at this time and start him on metoprolol succinate 12.5 mg a day. I'll see him again in 3 months for an office visit and EKG.  I'll see him in 3 months .    Current medicines are reviewed at length with the patient today.  The patient does not have concerns regarding medicines.  The following changes have been made:  no change  Labs/ tests ordered today include:  No orders of the defined types were placed in this encounter.     Disposition:   FU with me in 3 months .  Jeffrey Voth, Wonda Cheng, MD  02/17/2015 9:16 AM    [facility] [place], [place], [place]  [address] Phone: [phone]; Fax: [phone]   [facility]  [address] [place], [place]  [address] [phone]   Fax [phone]

## 2015-03-09 DIAGNOSIS — M545 Low back pain: Secondary | ICD-10-CM | POA: Diagnosis not present

## 2015-03-11 ENCOUNTER — Emergency Department (HOSPITAL_COMMUNITY)
Admission: EM | Admit: 2015-03-11 | Discharge: 2015-03-11 | Disposition: A | Discharge status: Home / Self Care | Payer: Commercial Managed Care - HMO | Attending: Emergency Medicine | Admitting: Emergency Medicine

## 2015-03-11 ENCOUNTER — Encounter (HOSPITAL_COMMUNITY): Payer: Self-pay | Admitting: Emergency Medicine

## 2015-03-11 ENCOUNTER — Emergency Department (HOSPITAL_COMMUNITY): Payer: Commercial Managed Care - HMO

## 2015-03-11 DIAGNOSIS — E119 Type 2 diabetes mellitus without complications: Secondary | ICD-10-CM | POA: Diagnosis not present

## 2015-03-11 DIAGNOSIS — Z8546 Personal history of malignant neoplasm of prostate: Secondary | ICD-10-CM | POA: Insufficient documentation

## 2015-03-11 DIAGNOSIS — Y998 Other external cause status: Secondary | ICD-10-CM | POA: Insufficient documentation

## 2015-03-11 DIAGNOSIS — W19XXXA Unspecified fall, initial encounter: Secondary | ICD-10-CM

## 2015-03-11 DIAGNOSIS — S3992XA Unspecified injury of lower back, initial encounter: Secondary | ICD-10-CM | POA: Diagnosis not present

## 2015-03-11 DIAGNOSIS — Z87891 Personal history of nicotine dependence: Secondary | ICD-10-CM | POA: Insufficient documentation

## 2015-03-11 DIAGNOSIS — W1839XA Other fall on same level, initial encounter: Secondary | ICD-10-CM | POA: Insufficient documentation

## 2015-03-11 DIAGNOSIS — Z79899 Other long term (current) drug therapy: Secondary | ICD-10-CM | POA: Diagnosis not present

## 2015-03-11 DIAGNOSIS — Y9389 Activity, other specified: Secondary | ICD-10-CM | POA: Insufficient documentation

## 2015-03-11 DIAGNOSIS — M549 Dorsalgia, unspecified: Secondary | ICD-10-CM | POA: Diagnosis not present

## 2015-03-11 DIAGNOSIS — Y9289 Other specified places as the place of occurrence of the external cause: Secondary | ICD-10-CM | POA: Insufficient documentation

## 2015-03-11 DIAGNOSIS — Z7982 Long term (current) use of aspirin: Secondary | ICD-10-CM | POA: Diagnosis not present

## 2015-03-11 DIAGNOSIS — R011 Cardiac murmur, unspecified: Secondary | ICD-10-CM | POA: Insufficient documentation

## 2015-03-11 DIAGNOSIS — N390 Urinary tract infection, site not specified: Secondary | ICD-10-CM

## 2015-03-11 DIAGNOSIS — T148 Other injury of unspecified body region: Secondary | ICD-10-CM | POA: Diagnosis not present

## 2015-03-11 DIAGNOSIS — Z8739 Personal history of other diseases of the musculoskeletal system and connective tissue: Secondary | ICD-10-CM | POA: Insufficient documentation

## 2015-03-11 DIAGNOSIS — Z7984 Long term (current) use of oral hypoglycemic drugs: Secondary | ICD-10-CM | POA: Insufficient documentation

## 2015-03-11 DIAGNOSIS — M545 Low back pain: Secondary | ICD-10-CM | POA: Diagnosis not present

## 2015-03-11 DIAGNOSIS — Z7901 Long term (current) use of anticoagulants: Secondary | ICD-10-CM | POA: Diagnosis not present

## 2015-03-11 DIAGNOSIS — E785 Hyperlipidemia, unspecified: Secondary | ICD-10-CM | POA: Insufficient documentation

## 2015-03-11 LAB — URINE MICROSCOPIC-ADD ON
RBC / HPF: NONE SEEN RBC/hpf (ref 0–5)
Squamous Epithelial / LPF: NONE SEEN

## 2015-03-11 LAB — URINALYSIS, ROUTINE W REFLEX MICROSCOPIC
Bilirubin Urine: NEGATIVE
Glucose, UA: NEGATIVE mg/dL
Ketones, ur: NEGATIVE mg/dL
Nitrite: POSITIVE — AB
Protein, ur: NEGATIVE mg/dL
Specific Gravity, Urine: 1.018 (ref 1.005–1.030)
pH: 5.5 (ref 5.0–8.0)

## 2015-03-11 MED ORDER — CEPHALEXIN 500 MG PO CAPS: 500.0000 mg | ORAL_CAPSULE | Freq: Three times a day (TID) | ORAL | Status: DC

## 2015-03-11 MED ORDER — CEPHALEXIN 500 MG PO CAPS
500.0000 mg | ORAL_CAPSULE | Freq: Once | ORAL | Status: AC
  Administered 2015-03-11: 500 mg via ORAL
  Filled 2015-03-11: qty 1

## 2015-03-11 NOTE — ED Notes (Signed)
Bed: [facility] Expected date:  Expected time:  Means of arrival:  Comments: 67M/back pain/fall

## 2015-03-11 NOTE — Discharge Instructions (Signed)

## 2015-03-11 NOTE — ED Notes (Signed)
From assisted living via [facility], reports mechanical fall with no LOC, c/o back pain which is also chronic, A/O and in NAD

## 2015-03-11 NOTE — ED Provider Notes (Signed)
CSN: [id number]     Arrival date & time 03/11/15  1156 History   First MD Initiated Contact with Patient 03/11/15 1453     Chief Complaint  Patient presents with  . Fall     (Consider location/radiation/quality/duration/timing/severity/associated sxs/prior Treatment) HPI   82 year old male presenting after fall. Mechanical. Patient was reaching for a walker when his hand slipped and he fell backwards/high dose left side. He reports he fell on a carpeted surface. She's not sure if he hit his head or not, but he denies any headache or neck pain. He called for assistance but they are unable to get him up from floor. Reports of any falls he typically has a lot of difficulty getting up. Says a second time he has fallen within the last few days. He's been having lower back pain since last fall. Currently denies any pain while laying in bed. Daughter-in-law is at bedside. She reports that he is at his baseline mental status. He has no acute neurological complaints. He is on Eliquis.  Past Medical History  Diagnosis Date  . Prostate cancer ([place])   . Diabetes mellitus without complication ([place])   . Dyslipidemia   . Cardiac murmur   . Bilateral leg edema   . Vitamin B deficiency   . Aortic stenosis   . Neurogenic bladder     self cath at home  . Hyperlipidemia 10/28/2014  . Combined fat and carbohydrate induced hyperlipemia 10/28/2014  . LBP (low back pain) 10/28/2014  . Elevated brain natriuretic peptide (BNP) level 09/01/2014   Past Surgical History  Procedure Laterality Date  . Orif humerus fracture      Open reduction and internal fixation of transcondylar distal humerus fracture with intercondylar extension   . Radial head arthroplasty     No family history on file. Social History  Substance Use Topics  . Smoking status: Former Research scientist (life sciences)  . Smokeless tobacco: None  . Alcohol Use: No    Review of Systems  All systems reviewed and negative, other than as noted in HPI.   Allergies   Review of patient's allergies indicates no known allergies.  Home Medications   Prior to Admission medications   Medication Sig Start Date End Date Taking? Authorizing Provider  apixaban (ELIQUIS) 5 MG TABS tablet Take 1 tablet (5 mg total) by mouth 2 (two) times daily. 11/17/14   Thayer Headings, MD  Aspirin (ASPIR-81 PO) Take 81 mg by mouth daily.    Historical Provider, MD  bicalutamide (CASODEX) 50 MG tablet Take 1 tablet by mouth daily.    Historical Provider, MD  cyanocobalamin (,VITAMIN B-12,) 1000 MCG/ML injection Inject into the muscle every 30 (thirty) days. 09/10/14   Historical Provider, MD  doxazosin (CARDURA) 2 MG tablet Take 1 tablet (2 mg total) by mouth daily. 11/17/14   Thayer Headings, MD  furosemide (LASIX) 40 MG tablet Take 1 tablet by mouth 2 (two) times daily.  03/25/10   Historical Provider, MD  Leuprolide Acetate, 6 Month, (LUPRON DEPOT) 45 MG injection Every 4 months    Historical Provider, MD  metFORMIN (GLUCOPHAGE) 500 MG tablet Take 1 tablet by mouth 2 (two) times daily. 01/13/11   Historical Provider, MD  metoprolol succinate (TOPROL XL) 25 MG 24 hr tablet Take 0.5 tablets (12.5 mg total) by mouth daily. 02/17/15   Thayer Headings, MD  pravastatin (PRAVACHOL) 10 MG tablet Take 1 tablet by mouth daily.    Historical Provider, MD   BP 138/65 mmHg  Pulse 55  Temp(Src) 97.2 F (36.2 C) (Oral)  Resp 16  SpO2 95% Physical Exam  Constitutional: He appears well-developed and well-nourished. No distress.  HENT:  Head: Normocephalic and atraumatic.  Eyes: Conjunctivae are normal. Right eye exhibits no discharge. Left eye exhibits no discharge.  Neck: Neck supple.  Cardiovascular: Normal rate, regular rhythm and normal heart sounds.  Exam reveals no gallop and no friction rub.   No murmur heard. Pulmonary/Chest: Effort normal and breath sounds normal. No respiratory distress.  Abdominal: Soft. He exhibits no distension. There is no tenderness.  Musculoskeletal: He  exhibits no edema or tenderness.  No midline spinal tenderness  Neurological: He is alert.  Skin: Skin is warm and dry.  Psychiatric: He has a normal mood and affect. His behavior is normal. Thought content normal.  Nursing note and vitals reviewed.   ED Course  Procedures (including critical care time) Labs Review Labs Reviewed  URINALYSIS, ROUTINE W REFLEX MICROSCOPIC (NOT AT [facility]) - Abnormal; Notable for the following:    APPearance TURBID (*)    Hgb urine dipstick SMALL (*)    Nitrite POSITIVE (*)    Leukocytes, UA LARGE (*)    All other components within normal limits  URINE MICROSCOPIC-ADD ON - Abnormal; Notable for the following:    Bacteria, UA MANY (*)    All other components within normal limits  URINE CULTURE    Imaging Review No results found. I have personally reviewed and evaluated these images and lab results as part of my medical decision-making.   EKG Interpretation None      MDM   Final diagnoses:  Fall, initial encounter  Low back pain without sciatica, unspecified back pain laterality  UTI (lower urinary tract infection)    82 year old male presenting after fall. He had some lower back pain although none while laying in bed and is not reproducible on exam. His physical exam is generally pretty unremarkable. Daughter-in-law questions whether he may potentially have a urinary tract infection. Since this has been a second fall within the last few days think checking urinalysis is reasonable. Patient also straight caths which would increase risk for infection. Will check plain films of his back. He is declining any pain medication. Anticipate discharge. Antibiotics if UA is consistent with UTI.    Virgel Manifold, MD 03/15/15 2211

## 2015-03-14 LAB — URINE CULTURE: Culture: >=100000

## 2015-03-15 ENCOUNTER — Telehealth: Payer: Self-pay | Admitting: *Deleted

## 2015-03-15 NOTE — Progress Notes (Signed)
ED Antimicrobial Stewardship Positive Culture Follow Up   Parker [name] is an 82 y.o. male who presented to [facility] on 03/11/2015 with a chief complaint of fall.  Chief Complaint  Patient presents with  . Fall    Recent Results (from the past 720 hour(s))  Urine culture     Status: None   Collection Time: 03/11/15  4:50 PM  Result Value Ref Range Status   Specimen Description URINE, CATHETERIZED  Final   Special Requests NONE  Final   Culture   Final    >=100,000 COLONIES/mL ENTEROBACTER CLOACAE Performed at [facility]    Report Status 03/14/2015 FINAL  Final   Organism ID, Bacteria ENTEROBACTER CLOACAE  Final      Susceptibility   Enterobacter cloacae - MIC*    CEFAZOLIN <=4 RESISTANT Resistant     CEFTRIAXONE <=1 SENSITIVE Sensitive     CIPROFLOXACIN <=0.25 SENSITIVE Sensitive     GENTAMICIN <=1 SENSITIVE Sensitive     IMIPENEM <=0.25 SENSITIVE Sensitive     NITROFURANTOIN 32 SENSITIVE Sensitive     TRIMETH/SULFA <=20 SENSITIVE Sensitive     PIP/TAZO <=4 SENSITIVE Sensitive     * >=100,000 COLONIES/mL ENTEROBACTER CLOACAE    [x]  Treated with Keflex, organism resistant to prescribed antimicrobial []  Patient discharged originally without antimicrobial agent and treatment is now indicated  New antibiotic prescription: Bactrim i DS tab BID x 3 days  ED Provider: Milus Mallick, PA   Stepen Prins, Jake Church 03/15/2015, 1:55 PM Infectious Diseases Pharmacist

## 2015-03-15 NOTE — ED Notes (Signed)
(+)  urine culture reviewed by Jeannett Senior.  Bactrim DS i tab BID x 3 days called to [place], [place] [phone], contacted son who verbalized understanding

## 2015-03-17 DIAGNOSIS — N312 Flaccid neuropathic bladder, not elsewhere classified: Secondary | ICD-10-CM | POA: Diagnosis not present

## 2015-03-17 DIAGNOSIS — Z0000 Encounter for general adult medical examination without abnormal findings: Secondary | ICD-10-CM | POA: Diagnosis not present

## 2015-03-17 DIAGNOSIS — C61 Malignant neoplasm of prostate: Secondary | ICD-10-CM | POA: Diagnosis not present

## 2015-03-21 DIAGNOSIS — R338 Other retention of urine: Secondary | ICD-10-CM | POA: Diagnosis not present

## 2015-03-21 DIAGNOSIS — Z466 Encounter for fitting and adjustment of urinary device: Secondary | ICD-10-CM | POA: Diagnosis not present

## 2015-03-21 DIAGNOSIS — E538 Deficiency of other specified B group vitamins: Secondary | ICD-10-CM | POA: Diagnosis not present

## 2015-03-21 DIAGNOSIS — E119 Type 2 diabetes mellitus without complications: Secondary | ICD-10-CM | POA: Diagnosis not present

## 2015-03-21 DIAGNOSIS — C61 Malignant neoplasm of prostate: Secondary | ICD-10-CM | POA: Diagnosis not present

## 2015-03-21 DIAGNOSIS — R531 Weakness: Secondary | ICD-10-CM | POA: Diagnosis not present

## 2015-03-21 DIAGNOSIS — R296 Repeated falls: Secondary | ICD-10-CM | POA: Diagnosis not present

## 2015-03-21 DIAGNOSIS — N312 Flaccid neuropathic bladder, not elsewhere classified: Secondary | ICD-10-CM | POA: Diagnosis not present

## 2015-03-21 DIAGNOSIS — N319 Neuromuscular dysfunction of bladder, unspecified: Secondary | ICD-10-CM | POA: Diagnosis not present

## 2015-03-21 DIAGNOSIS — M545 Low back pain: Secondary | ICD-10-CM | POA: Diagnosis not present

## 2015-03-24 DIAGNOSIS — R531 Weakness: Secondary | ICD-10-CM | POA: Diagnosis not present

## 2015-03-24 DIAGNOSIS — C61 Malignant neoplasm of prostate: Secondary | ICD-10-CM | POA: Diagnosis not present

## 2015-03-24 DIAGNOSIS — R296 Repeated falls: Secondary | ICD-10-CM | POA: Diagnosis not present

## 2015-03-24 DIAGNOSIS — M545 Low back pain: Secondary | ICD-10-CM | POA: Diagnosis not present

## 2015-03-24 DIAGNOSIS — E119 Type 2 diabetes mellitus without complications: Secondary | ICD-10-CM | POA: Diagnosis not present

## 2015-03-24 DIAGNOSIS — N319 Neuromuscular dysfunction of bladder, unspecified: Secondary | ICD-10-CM | POA: Diagnosis not present

## 2015-03-26 DIAGNOSIS — N319 Neuromuscular dysfunction of bladder, unspecified: Secondary | ICD-10-CM | POA: Diagnosis not present

## 2015-03-26 DIAGNOSIS — M545 Low back pain: Secondary | ICD-10-CM | POA: Diagnosis not present

## 2015-03-26 DIAGNOSIS — R531 Weakness: Secondary | ICD-10-CM | POA: Diagnosis not present

## 2015-03-26 DIAGNOSIS — C61 Malignant neoplasm of prostate: Secondary | ICD-10-CM | POA: Diagnosis not present

## 2015-03-26 DIAGNOSIS — E119 Type 2 diabetes mellitus without complications: Secondary | ICD-10-CM | POA: Diagnosis not present

## 2015-03-26 DIAGNOSIS — R296 Repeated falls: Secondary | ICD-10-CM | POA: Diagnosis not present

## 2015-03-27 DIAGNOSIS — M545 Low back pain: Secondary | ICD-10-CM | POA: Diagnosis not present

## 2015-03-27 DIAGNOSIS — C61 Malignant neoplasm of prostate: Secondary | ICD-10-CM | POA: Diagnosis not present

## 2015-03-27 DIAGNOSIS — N319 Neuromuscular dysfunction of bladder, unspecified: Secondary | ICD-10-CM | POA: Diagnosis not present

## 2015-03-27 DIAGNOSIS — R296 Repeated falls: Secondary | ICD-10-CM | POA: Diagnosis not present

## 2015-03-27 DIAGNOSIS — R531 Weakness: Secondary | ICD-10-CM | POA: Diagnosis not present

## 2015-03-27 DIAGNOSIS — E119 Type 2 diabetes mellitus without complications: Secondary | ICD-10-CM | POA: Diagnosis not present

## 2015-03-30 DIAGNOSIS — R531 Weakness: Secondary | ICD-10-CM | POA: Diagnosis not present

## 2015-03-30 DIAGNOSIS — E119 Type 2 diabetes mellitus without complications: Secondary | ICD-10-CM | POA: Diagnosis not present

## 2015-03-30 DIAGNOSIS — N319 Neuromuscular dysfunction of bladder, unspecified: Secondary | ICD-10-CM | POA: Diagnosis not present

## 2015-03-30 DIAGNOSIS — C61 Malignant neoplasm of prostate: Secondary | ICD-10-CM | POA: Diagnosis not present

## 2015-03-30 DIAGNOSIS — R296 Repeated falls: Secondary | ICD-10-CM | POA: Diagnosis not present

## 2015-03-30 DIAGNOSIS — M545 Low back pain: Secondary | ICD-10-CM | POA: Diagnosis not present

## 2015-04-01 DIAGNOSIS — R531 Weakness: Secondary | ICD-10-CM | POA: Diagnosis not present

## 2015-04-01 DIAGNOSIS — C61 Malignant neoplasm of prostate: Secondary | ICD-10-CM | POA: Diagnosis not present

## 2015-04-01 DIAGNOSIS — R296 Repeated falls: Secondary | ICD-10-CM | POA: Diagnosis not present

## 2015-04-01 DIAGNOSIS — E119 Type 2 diabetes mellitus without complications: Secondary | ICD-10-CM | POA: Diagnosis not present

## 2015-04-01 DIAGNOSIS — N319 Neuromuscular dysfunction of bladder, unspecified: Secondary | ICD-10-CM | POA: Diagnosis not present

## 2015-04-01 DIAGNOSIS — M545 Low back pain: Secondary | ICD-10-CM | POA: Diagnosis not present

## 2015-04-03 DIAGNOSIS — R531 Weakness: Secondary | ICD-10-CM | POA: Diagnosis not present

## 2015-04-03 DIAGNOSIS — E119 Type 2 diabetes mellitus without complications: Secondary | ICD-10-CM | POA: Diagnosis not present

## 2015-04-03 DIAGNOSIS — N319 Neuromuscular dysfunction of bladder, unspecified: Secondary | ICD-10-CM | POA: Diagnosis not present

## 2015-04-03 DIAGNOSIS — R296 Repeated falls: Secondary | ICD-10-CM | POA: Diagnosis not present

## 2015-04-03 DIAGNOSIS — C61 Malignant neoplasm of prostate: Secondary | ICD-10-CM | POA: Diagnosis not present

## 2015-04-03 DIAGNOSIS — M545 Low back pain: Secondary | ICD-10-CM | POA: Diagnosis not present

## 2015-04-07 DIAGNOSIS — R531 Weakness: Secondary | ICD-10-CM | POA: Diagnosis not present

## 2015-04-07 DIAGNOSIS — C61 Malignant neoplasm of prostate: Secondary | ICD-10-CM | POA: Diagnosis not present

## 2015-04-07 DIAGNOSIS — N319 Neuromuscular dysfunction of bladder, unspecified: Secondary | ICD-10-CM | POA: Diagnosis not present

## 2015-04-07 DIAGNOSIS — M545 Low back pain: Secondary | ICD-10-CM | POA: Diagnosis not present

## 2015-04-07 DIAGNOSIS — E119 Type 2 diabetes mellitus without complications: Secondary | ICD-10-CM | POA: Diagnosis not present

## 2015-04-07 DIAGNOSIS — R296 Repeated falls: Secondary | ICD-10-CM | POA: Diagnosis not present

## 2015-04-08 DIAGNOSIS — R531 Weakness: Secondary | ICD-10-CM | POA: Diagnosis not present

## 2015-04-08 DIAGNOSIS — C61 Malignant neoplasm of prostate: Secondary | ICD-10-CM | POA: Diagnosis not present

## 2015-04-08 DIAGNOSIS — M545 Low back pain: Secondary | ICD-10-CM | POA: Diagnosis not present

## 2015-04-08 DIAGNOSIS — E119 Type 2 diabetes mellitus without complications: Secondary | ICD-10-CM | POA: Diagnosis not present

## 2015-04-08 DIAGNOSIS — R296 Repeated falls: Secondary | ICD-10-CM | POA: Diagnosis not present

## 2015-04-08 DIAGNOSIS — N319 Neuromuscular dysfunction of bladder, unspecified: Secondary | ICD-10-CM | POA: Diagnosis not present

## 2015-04-10 DIAGNOSIS — E119 Type 2 diabetes mellitus without complications: Secondary | ICD-10-CM | POA: Diagnosis not present

## 2015-04-10 DIAGNOSIS — R531 Weakness: Secondary | ICD-10-CM | POA: Diagnosis not present

## 2015-04-10 DIAGNOSIS — R296 Repeated falls: Secondary | ICD-10-CM | POA: Diagnosis not present

## 2015-04-10 DIAGNOSIS — C61 Malignant neoplasm of prostate: Secondary | ICD-10-CM | POA: Diagnosis not present

## 2015-04-10 DIAGNOSIS — M545 Low back pain: Secondary | ICD-10-CM | POA: Diagnosis not present

## 2015-04-10 DIAGNOSIS — N319 Neuromuscular dysfunction of bladder, unspecified: Secondary | ICD-10-CM | POA: Diagnosis not present

## 2015-04-13 DIAGNOSIS — R296 Repeated falls: Secondary | ICD-10-CM | POA: Diagnosis not present

## 2015-04-13 DIAGNOSIS — C61 Malignant neoplasm of prostate: Secondary | ICD-10-CM | POA: Diagnosis not present

## 2015-04-13 DIAGNOSIS — N319 Neuromuscular dysfunction of bladder, unspecified: Secondary | ICD-10-CM | POA: Diagnosis not present

## 2015-04-13 DIAGNOSIS — E119 Type 2 diabetes mellitus without complications: Secondary | ICD-10-CM | POA: Diagnosis not present

## 2015-04-13 DIAGNOSIS — R531 Weakness: Secondary | ICD-10-CM | POA: Diagnosis not present

## 2015-04-13 DIAGNOSIS — M545 Low back pain: Secondary | ICD-10-CM | POA: Diagnosis not present

## 2015-04-15 DIAGNOSIS — R296 Repeated falls: Secondary | ICD-10-CM | POA: Diagnosis not present

## 2015-04-15 DIAGNOSIS — N319 Neuromuscular dysfunction of bladder, unspecified: Secondary | ICD-10-CM | POA: Diagnosis not present

## 2015-04-15 DIAGNOSIS — M545 Low back pain: Secondary | ICD-10-CM | POA: Diagnosis not present

## 2015-04-15 DIAGNOSIS — C61 Malignant neoplasm of prostate: Secondary | ICD-10-CM | POA: Diagnosis not present

## 2015-04-15 DIAGNOSIS — R531 Weakness: Secondary | ICD-10-CM | POA: Diagnosis not present

## 2015-04-15 DIAGNOSIS — E119 Type 2 diabetes mellitus without complications: Secondary | ICD-10-CM | POA: Diagnosis not present

## 2015-04-16 DIAGNOSIS — R531 Weakness: Secondary | ICD-10-CM | POA: Diagnosis not present

## 2015-04-16 DIAGNOSIS — M545 Low back pain: Secondary | ICD-10-CM | POA: Diagnosis not present

## 2015-04-16 DIAGNOSIS — E119 Type 2 diabetes mellitus without complications: Secondary | ICD-10-CM | POA: Diagnosis not present

## 2015-04-16 DIAGNOSIS — C61 Malignant neoplasm of prostate: Secondary | ICD-10-CM | POA: Diagnosis not present

## 2015-04-16 DIAGNOSIS — N390 Urinary tract infection, site not specified: Secondary | ICD-10-CM | POA: Diagnosis not present

## 2015-04-16 DIAGNOSIS — N319 Neuromuscular dysfunction of bladder, unspecified: Secondary | ICD-10-CM | POA: Diagnosis not present

## 2015-04-16 DIAGNOSIS — R296 Repeated falls: Secondary | ICD-10-CM | POA: Diagnosis not present

## 2015-04-17 DIAGNOSIS — N319 Neuromuscular dysfunction of bladder, unspecified: Secondary | ICD-10-CM | POA: Diagnosis not present

## 2015-04-17 DIAGNOSIS — R296 Repeated falls: Secondary | ICD-10-CM | POA: Diagnosis not present

## 2015-04-17 DIAGNOSIS — C61 Malignant neoplasm of prostate: Secondary | ICD-10-CM | POA: Diagnosis not present

## 2015-04-17 DIAGNOSIS — E119 Type 2 diabetes mellitus without complications: Secondary | ICD-10-CM | POA: Diagnosis not present

## 2015-04-17 DIAGNOSIS — M545 Low back pain: Secondary | ICD-10-CM | POA: Diagnosis not present

## 2015-04-17 DIAGNOSIS — R531 Weakness: Secondary | ICD-10-CM | POA: Diagnosis not present

## 2015-04-21 DIAGNOSIS — N319 Neuromuscular dysfunction of bladder, unspecified: Secondary | ICD-10-CM | POA: Diagnosis not present

## 2015-04-21 DIAGNOSIS — R296 Repeated falls: Secondary | ICD-10-CM | POA: Diagnosis not present

## 2015-04-21 DIAGNOSIS — E119 Type 2 diabetes mellitus without complications: Secondary | ICD-10-CM | POA: Diagnosis not present

## 2015-04-21 DIAGNOSIS — C61 Malignant neoplasm of prostate: Secondary | ICD-10-CM | POA: Diagnosis not present

## 2015-04-21 DIAGNOSIS — M545 Low back pain: Secondary | ICD-10-CM | POA: Diagnosis not present

## 2015-04-21 DIAGNOSIS — R531 Weakness: Secondary | ICD-10-CM | POA: Diagnosis not present

## 2015-04-22 DIAGNOSIS — N390 Urinary tract infection, site not specified: Secondary | ICD-10-CM | POA: Diagnosis not present

## 2015-04-22 DIAGNOSIS — N319 Neuromuscular dysfunction of bladder, unspecified: Secondary | ICD-10-CM | POA: Diagnosis not present

## 2015-04-22 DIAGNOSIS — C61 Malignant neoplasm of prostate: Secondary | ICD-10-CM | POA: Diagnosis not present

## 2015-04-22 DIAGNOSIS — R531 Weakness: Secondary | ICD-10-CM | POA: Diagnosis not present

## 2015-04-22 DIAGNOSIS — M545 Low back pain: Secondary | ICD-10-CM | POA: Diagnosis not present

## 2015-04-22 DIAGNOSIS — R296 Repeated falls: Secondary | ICD-10-CM | POA: Diagnosis not present

## 2015-04-22 DIAGNOSIS — E119 Type 2 diabetes mellitus without complications: Secondary | ICD-10-CM | POA: Diagnosis not present

## 2015-04-23 DIAGNOSIS — M545 Low back pain: Secondary | ICD-10-CM | POA: Diagnosis not present

## 2015-04-23 DIAGNOSIS — C61 Malignant neoplasm of prostate: Secondary | ICD-10-CM | POA: Diagnosis not present

## 2015-04-23 DIAGNOSIS — R296 Repeated falls: Secondary | ICD-10-CM | POA: Diagnosis not present

## 2015-04-23 DIAGNOSIS — N319 Neuromuscular dysfunction of bladder, unspecified: Secondary | ICD-10-CM | POA: Diagnosis not present

## 2015-04-23 DIAGNOSIS — R531 Weakness: Secondary | ICD-10-CM | POA: Diagnosis not present

## 2015-04-23 DIAGNOSIS — E119 Type 2 diabetes mellitus without complications: Secondary | ICD-10-CM | POA: Diagnosis not present

## 2015-04-24 DIAGNOSIS — E119 Type 2 diabetes mellitus without complications: Secondary | ICD-10-CM | POA: Diagnosis not present

## 2015-04-24 DIAGNOSIS — N319 Neuromuscular dysfunction of bladder, unspecified: Secondary | ICD-10-CM | POA: Diagnosis not present

## 2015-04-24 DIAGNOSIS — C61 Malignant neoplasm of prostate: Secondary | ICD-10-CM | POA: Diagnosis not present

## 2015-04-24 DIAGNOSIS — R531 Weakness: Secondary | ICD-10-CM | POA: Diagnosis not present

## 2015-04-24 DIAGNOSIS — R296 Repeated falls: Secondary | ICD-10-CM | POA: Diagnosis not present

## 2015-04-24 DIAGNOSIS — M545 Low back pain: Secondary | ICD-10-CM | POA: Diagnosis not present

## 2015-04-27 DIAGNOSIS — R296 Repeated falls: Secondary | ICD-10-CM | POA: Diagnosis not present

## 2015-04-27 DIAGNOSIS — C61 Malignant neoplasm of prostate: Secondary | ICD-10-CM | POA: Diagnosis not present

## 2015-04-27 DIAGNOSIS — R531 Weakness: Secondary | ICD-10-CM | POA: Diagnosis not present

## 2015-04-27 DIAGNOSIS — N319 Neuromuscular dysfunction of bladder, unspecified: Secondary | ICD-10-CM | POA: Diagnosis not present

## 2015-04-27 DIAGNOSIS — E119 Type 2 diabetes mellitus without complications: Secondary | ICD-10-CM | POA: Diagnosis not present

## 2015-04-27 DIAGNOSIS — M545 Low back pain: Secondary | ICD-10-CM | POA: Diagnosis not present

## 2015-04-28 DIAGNOSIS — M545 Low back pain: Secondary | ICD-10-CM | POA: Diagnosis not present

## 2015-04-28 DIAGNOSIS — R296 Repeated falls: Secondary | ICD-10-CM | POA: Diagnosis not present

## 2015-04-28 DIAGNOSIS — E119 Type 2 diabetes mellitus without complications: Secondary | ICD-10-CM | POA: Diagnosis not present

## 2015-04-28 DIAGNOSIS — C61 Malignant neoplasm of prostate: Secondary | ICD-10-CM | POA: Diagnosis not present

## 2015-04-28 DIAGNOSIS — N319 Neuromuscular dysfunction of bladder, unspecified: Secondary | ICD-10-CM | POA: Diagnosis not present

## 2015-04-28 DIAGNOSIS — R531 Weakness: Secondary | ICD-10-CM | POA: Diagnosis not present

## 2015-04-29 DIAGNOSIS — N319 Neuromuscular dysfunction of bladder, unspecified: Secondary | ICD-10-CM | POA: Diagnosis not present

## 2015-04-29 DIAGNOSIS — R531 Weakness: Secondary | ICD-10-CM | POA: Diagnosis not present

## 2015-04-29 DIAGNOSIS — E119 Type 2 diabetes mellitus without complications: Secondary | ICD-10-CM | POA: Diagnosis not present

## 2015-04-29 DIAGNOSIS — C61 Malignant neoplasm of prostate: Secondary | ICD-10-CM | POA: Diagnosis not present

## 2015-04-29 DIAGNOSIS — M545 Low back pain: Secondary | ICD-10-CM | POA: Diagnosis not present

## 2015-04-29 DIAGNOSIS — R296 Repeated falls: Secondary | ICD-10-CM | POA: Diagnosis not present

## 2015-04-30 DIAGNOSIS — E119 Type 2 diabetes mellitus without complications: Secondary | ICD-10-CM | POA: Diagnosis not present

## 2015-04-30 DIAGNOSIS — N319 Neuromuscular dysfunction of bladder, unspecified: Secondary | ICD-10-CM | POA: Diagnosis not present

## 2015-04-30 DIAGNOSIS — C61 Malignant neoplasm of prostate: Secondary | ICD-10-CM | POA: Diagnosis not present

## 2015-04-30 DIAGNOSIS — M545 Low back pain: Secondary | ICD-10-CM | POA: Diagnosis not present

## 2015-04-30 DIAGNOSIS — R531 Weakness: Secondary | ICD-10-CM | POA: Diagnosis not present

## 2015-04-30 DIAGNOSIS — R296 Repeated falls: Secondary | ICD-10-CM | POA: Diagnosis not present

## 2015-05-01 DIAGNOSIS — E119 Type 2 diabetes mellitus without complications: Secondary | ICD-10-CM | POA: Diagnosis not present

## 2015-05-01 DIAGNOSIS — M545 Low back pain: Secondary | ICD-10-CM | POA: Diagnosis not present

## 2015-05-01 DIAGNOSIS — R531 Weakness: Secondary | ICD-10-CM | POA: Diagnosis not present

## 2015-05-01 DIAGNOSIS — N319 Neuromuscular dysfunction of bladder, unspecified: Secondary | ICD-10-CM | POA: Diagnosis not present

## 2015-05-01 DIAGNOSIS — C61 Malignant neoplasm of prostate: Secondary | ICD-10-CM | POA: Diagnosis not present

## 2015-05-01 DIAGNOSIS — R296 Repeated falls: Secondary | ICD-10-CM | POA: Diagnosis not present

## 2015-05-04 DIAGNOSIS — N319 Neuromuscular dysfunction of bladder, unspecified: Secondary | ICD-10-CM | POA: Diagnosis not present

## 2015-05-04 DIAGNOSIS — M545 Low back pain: Secondary | ICD-10-CM | POA: Diagnosis not present

## 2015-05-04 DIAGNOSIS — E119 Type 2 diabetes mellitus without complications: Secondary | ICD-10-CM | POA: Diagnosis not present

## 2015-05-04 DIAGNOSIS — C61 Malignant neoplasm of prostate: Secondary | ICD-10-CM | POA: Diagnosis not present

## 2015-05-04 DIAGNOSIS — R531 Weakness: Secondary | ICD-10-CM | POA: Diagnosis not present

## 2015-05-04 DIAGNOSIS — R296 Repeated falls: Secondary | ICD-10-CM | POA: Diagnosis not present

## 2015-05-05 DIAGNOSIS — R531 Weakness: Secondary | ICD-10-CM | POA: Diagnosis not present

## 2015-05-05 DIAGNOSIS — N319 Neuromuscular dysfunction of bladder, unspecified: Secondary | ICD-10-CM | POA: Diagnosis not present

## 2015-05-05 DIAGNOSIS — M545 Low back pain: Secondary | ICD-10-CM | POA: Diagnosis not present

## 2015-05-05 DIAGNOSIS — C61 Malignant neoplasm of prostate: Secondary | ICD-10-CM | POA: Diagnosis not present

## 2015-05-05 DIAGNOSIS — R296 Repeated falls: Secondary | ICD-10-CM | POA: Diagnosis not present

## 2015-05-05 DIAGNOSIS — E119 Type 2 diabetes mellitus without complications: Secondary | ICD-10-CM | POA: Diagnosis not present

## 2015-05-06 DIAGNOSIS — R296 Repeated falls: Secondary | ICD-10-CM | POA: Diagnosis not present

## 2015-05-06 DIAGNOSIS — R531 Weakness: Secondary | ICD-10-CM | POA: Diagnosis not present

## 2015-05-06 DIAGNOSIS — E119 Type 2 diabetes mellitus without complications: Secondary | ICD-10-CM | POA: Diagnosis not present

## 2015-05-06 DIAGNOSIS — M545 Low back pain: Secondary | ICD-10-CM | POA: Diagnosis not present

## 2015-05-06 DIAGNOSIS — C61 Malignant neoplasm of prostate: Secondary | ICD-10-CM | POA: Diagnosis not present

## 2015-05-06 DIAGNOSIS — N319 Neuromuscular dysfunction of bladder, unspecified: Secondary | ICD-10-CM | POA: Diagnosis not present

## 2015-05-07 DIAGNOSIS — K5900 Constipation, unspecified: Secondary | ICD-10-CM | POA: Diagnosis not present

## 2015-05-08 DIAGNOSIS — R296 Repeated falls: Secondary | ICD-10-CM | POA: Diagnosis not present

## 2015-05-08 DIAGNOSIS — N319 Neuromuscular dysfunction of bladder, unspecified: Secondary | ICD-10-CM | POA: Diagnosis not present

## 2015-05-08 DIAGNOSIS — C61 Malignant neoplasm of prostate: Secondary | ICD-10-CM | POA: Diagnosis not present

## 2015-05-08 DIAGNOSIS — M545 Low back pain: Secondary | ICD-10-CM | POA: Diagnosis not present

## 2015-05-08 DIAGNOSIS — R531 Weakness: Secondary | ICD-10-CM | POA: Diagnosis not present

## 2015-05-08 DIAGNOSIS — E119 Type 2 diabetes mellitus without complications: Secondary | ICD-10-CM | POA: Diagnosis not present

## 2015-05-11 DIAGNOSIS — C61 Malignant neoplasm of prostate: Secondary | ICD-10-CM | POA: Diagnosis not present

## 2015-05-11 DIAGNOSIS — R531 Weakness: Secondary | ICD-10-CM | POA: Diagnosis not present

## 2015-05-11 DIAGNOSIS — M545 Low back pain: Secondary | ICD-10-CM | POA: Diagnosis not present

## 2015-05-11 DIAGNOSIS — E119 Type 2 diabetes mellitus without complications: Secondary | ICD-10-CM | POA: Diagnosis not present

## 2015-05-11 DIAGNOSIS — R296 Repeated falls: Secondary | ICD-10-CM | POA: Diagnosis not present

## 2015-05-11 DIAGNOSIS — N319 Neuromuscular dysfunction of bladder, unspecified: Secondary | ICD-10-CM | POA: Diagnosis not present

## 2015-05-12 DIAGNOSIS — C61 Malignant neoplasm of prostate: Secondary | ICD-10-CM | POA: Diagnosis not present

## 2015-05-12 DIAGNOSIS — N319 Neuromuscular dysfunction of bladder, unspecified: Secondary | ICD-10-CM | POA: Diagnosis not present

## 2015-05-12 DIAGNOSIS — R531 Weakness: Secondary | ICD-10-CM | POA: Diagnosis not present

## 2015-05-12 DIAGNOSIS — E119 Type 2 diabetes mellitus without complications: Secondary | ICD-10-CM | POA: Diagnosis not present

## 2015-05-12 DIAGNOSIS — R296 Repeated falls: Secondary | ICD-10-CM | POA: Diagnosis not present

## 2015-05-12 DIAGNOSIS — M545 Low back pain: Secondary | ICD-10-CM | POA: Diagnosis not present

## 2015-05-14 DIAGNOSIS — R531 Weakness: Secondary | ICD-10-CM | POA: Diagnosis not present

## 2015-05-14 DIAGNOSIS — C61 Malignant neoplasm of prostate: Secondary | ICD-10-CM | POA: Diagnosis not present

## 2015-05-14 DIAGNOSIS — E119 Type 2 diabetes mellitus without complications: Secondary | ICD-10-CM | POA: Diagnosis not present

## 2015-05-14 DIAGNOSIS — M545 Low back pain: Secondary | ICD-10-CM | POA: Diagnosis not present

## 2015-05-14 DIAGNOSIS — R296 Repeated falls: Secondary | ICD-10-CM | POA: Diagnosis not present

## 2015-05-14 DIAGNOSIS — N319 Neuromuscular dysfunction of bladder, unspecified: Secondary | ICD-10-CM | POA: Diagnosis not present

## 2015-05-15 DIAGNOSIS — C61 Malignant neoplasm of prostate: Secondary | ICD-10-CM | POA: Diagnosis not present

## 2015-05-15 DIAGNOSIS — N319 Neuromuscular dysfunction of bladder, unspecified: Secondary | ICD-10-CM | POA: Diagnosis not present

## 2015-05-15 DIAGNOSIS — R531 Weakness: Secondary | ICD-10-CM | POA: Diagnosis not present

## 2015-05-15 DIAGNOSIS — R296 Repeated falls: Secondary | ICD-10-CM | POA: Diagnosis not present

## 2015-05-15 DIAGNOSIS — M545 Low back pain: Secondary | ICD-10-CM | POA: Diagnosis not present

## 2015-05-15 DIAGNOSIS — E119 Type 2 diabetes mellitus without complications: Secondary | ICD-10-CM | POA: Diagnosis not present

## 2015-05-18 DIAGNOSIS — R531 Weakness: Secondary | ICD-10-CM | POA: Diagnosis not present

## 2015-05-18 DIAGNOSIS — E119 Type 2 diabetes mellitus without complications: Secondary | ICD-10-CM | POA: Diagnosis not present

## 2015-05-18 DIAGNOSIS — N319 Neuromuscular dysfunction of bladder, unspecified: Secondary | ICD-10-CM | POA: Diagnosis not present

## 2015-05-18 DIAGNOSIS — C61 Malignant neoplasm of prostate: Secondary | ICD-10-CM | POA: Diagnosis not present

## 2015-05-18 DIAGNOSIS — M545 Low back pain: Secondary | ICD-10-CM | POA: Diagnosis not present

## 2015-05-18 DIAGNOSIS — R296 Repeated falls: Secondary | ICD-10-CM | POA: Diagnosis not present

## 2015-05-19 ENCOUNTER — Encounter: Payer: Self-pay | Admitting: Cardiovascular Disease

## 2015-05-19 ENCOUNTER — Ambulatory Visit (INDEPENDENT_AMBULATORY_CARE_PROVIDER_SITE_OTHER): Payer: Commercial Managed Care - HMO | Admitting: Cardiovascular Disease

## 2015-05-19 VITALS — BP 104/70 | HR 59 | Ht 72.0 in | Wt 200.0 lb

## 2015-05-19 DIAGNOSIS — I350 Nonrheumatic aortic (valve) stenosis: Secondary | ICD-10-CM | POA: Diagnosis not present

## 2015-05-19 DIAGNOSIS — I482 Chronic atrial fibrillation, unspecified: Secondary | ICD-10-CM

## 2015-05-19 NOTE — Progress Notes (Signed)
Cardiology Office Note   Date:  05/19/2015   ID:  Parker [name], DOB [birth year], MRN [id number]  PCP:  Lottie [place], MD  Cardiologist:   Mertie Moores, MD   Chief Complaint  Patient presents with  . Follow-up    aortic stenosis   Problem list 1. Aortic stenosis 2. Diabetes mellitus 3. Hyperlipidemia 4. Prostate Cancer  5. Atrial fibrillation;     History of Present Illness: Parker [name] is a 82 y.o. male who presents for evaluation of aortic stenosis.   Was seen with his son , Parker [name].  Has had a heart murmur for year.  Able to do his normal activities without problem.  No PND or orthopnea .  Has some DOE ( noted by his son)  No CP or syncope.    Some of memory issues.  May 19, 2015:  Tab has moved to assisted living since we last met.  Spending more time in the wheelchair.    Still walking in his appt with a walker.    Getting rehab , has a indwelling catheter  No CP or dyspnea.      Past Medical History  Diagnosis Date  . Prostate cancer (Parker [name])   . Diabetes mellitus without complication (Parker [name])   . Dyslipidemia   . Cardiac murmur   . Bilateral leg edema   . Vitamin B deficiency   . Aortic stenosis   . Neurogenic bladder     self cath at home  . Hyperlipidemia 10/28/2014  . Combined fat and carbohydrate induced hyperlipemia 10/28/2014  . LBP (low back pain) 10/28/2014  . Elevated brain natriuretic peptide (BNP) level 09/01/2014    Past Surgical History  Procedure Laterality Date  . Orif humerus fracture      Open reduction and internal fixation of transcondylar distal humerus fracture with intercondylar extension   . Radial head arthroplasty       Current Outpatient Prescriptions  Medication Sig Dispense Refill  . apixaban (ELIQUIS) 5 MG TABS tablet Take 1 tablet (5 mg total) by mouth 2 (two) times daily. 60 tablet 11  . Aspirin (ASPIR-81 PO) Take 81 mg by mouth daily.    . bicalutamide (CASODEX) 50 MG tablet Take 1 tablet by mouth daily.      . cyanocobalamin (,VITAMIN B-12,) 1000 MCG/ML injection Inject 1,000 mcg into the muscle once a week.  0  . doxazosin (CARDURA) 2 MG tablet Take 1 tablet (2 mg total) by mouth daily. 90 tablet 3  . furosemide (LASIX) 40 MG tablet Take 1 tablet by mouth 2 (two) times daily.     Marland Kitchen HYDROcodone-acetaminophen (NORCO/VICODIN) 5-325 MG tablet Take 1 tablet by mouth 2 (two) times daily as needed.  0  . Leuprolide Acetate, 6 Month, (LUPRON DEPOT) 45 MG injection Every 4 months    . metFORMIN (GLUCOPHAGE-XR) 500 MG 24 hr tablet Take 500 mg by mouth 2 (two) times daily.  0  . metoprolol succinate (TOPROL XL) 25 MG 24 hr tablet Take 0.5 tablets (12.5 mg total) by mouth daily. 45 tablet 3  . pravastatin (PRAVACHOL) 10 MG tablet Take 1 tablet by mouth at bedtime.      No current facility-administered medications for this visit.    Allergies:   Review of patient's allergies indicates no known allergies.    Social History:  The patient  reports that he has quit smoking. He does not have any smokeless tobacco history on file. He reports that he does not drink alcohol or use  illicit drugs.   Family History:  The patient's family history is not on file.    ROS:  Please see the history of present illness.    Review of Systems: Constitutional:  denies fever, chills, diaphoresis, appetite change and fatigue.  HEENT: denies photophobia, eye pain, redness, hearing loss, ear pain, congestion, sore throat, rhinorrhea, sneezing, neck pain, neck stiffness and tinnitus.  Respiratory: denies SOB, DOE, cough, chest tightness, and wheezing.  Cardiovascular: admits to  leg swelling.  Gastrointestinal: denies nausea, vomiting, abdominal pain, diarrhea, constipation, blood in stool.  Genitourinary: denies dysuria, urgency, frequency, hematuria, flank pain and difficulty urinating.  Musculoskeletal: denies  myalgias, back pain, joint swelling, arthralgias and gait problem.   Skin: denies pallor, rash and wound.   Neurological: denies dizziness, seizures, syncope, weakness, light-headedness, numbness and headaches.   Hematological: denies adenopathy, easy bruising, personal or family bleeding history.  Psychiatric/ Behavioral: denies suicidal ideation, mood changes, confusion, nervousness, sleep disturbance and agitation.       All other systems are reviewed and negative.    PHYSICAL EXAM: VS:  BP 104/70 mmHg  Pulse 59  Ht 6' (1.829 m)  Wt 200 lb (90.719 kg)  BMI 27.12 kg/m2 , BMI Body mass index is 27.12 kg/(m^2). GEN: Well nourished, well developed, in no acute distress HEENT: normal Neck: no JVD, carotid bruits, or masses Cardiac: Irregularly irregular. He is mildly tachycardic.; no murmurs, rubs, or gallops,   1-2+ pitting edema bilaterally. Respiratory:  clear to auscultation bilaterally, normal work of breathing GI: soft, nontender, nondistended, + BS MS: no deformity or atrophy Skin: warm and dry, no rash Neuro:  Strength and sensation are intact Psych: normal   EKG:  EKG is ordered today. The ekg ordered today demonstrates  Atrial fib at rate of 105  NS ST ab.    Recent Labs: 07/28/2014: ALT 9* 02/17/2015: BUN 24; Creat 0.90; Hemoglobin 12.9*; Platelets 199; Potassium 4.6; Sodium 137    Lipid Panel No results found for: CHOL, TRIG, HDL, CHOLHDL, VLDL, LDLCALC, LDLDIRECT    Wt Readings from Last 3 Encounters:  05/19/15 200 lb (90.719 kg)  02/17/15 212 lb 12.8 oz (96.525 kg)  11/17/14 217 lb 1.9 oz (98.485 kg)    ECG:   Junctional rhythm at 59.  Other studies Reviewed: Additional studies/ records that were reviewed today include:. Review of the above records demonstrates:    ASSESSMENT AND PLAN:  1. Aortic stenosis - he has moderate aortic stenosis by echo. He doesn't really seem to have any specific symptoms related to aortic stenosis. He's generally weak and I'm not sure that he's limited by his aortic stenosis.  We discussed TAVR at previous visits At this  point, I do not think he is strong enough to go through TAVR I would recommend conservative care  - even comfort care when we get to that point   2. Diabetes mellitus 3. Hyperlipidemia 4. Prostate Cancer - followed by Dr. Gaynelle [name].   At this point we will stop his Cardura. He now has an indwelling Foley catheter and does not need to Cardura. His blood pressure is on the low side.  5. Atrial fibrillation:  His CHADS2 VASC score is  3   (age > 68, DM)  Continue Eliquis . We will stop the aspirin.  His heart rate remained slow, without the metoprolol his heart rate was 104. He now spends much of his time in a wheelchair and has not had any episodes of syncope. We'll continue to have him  follow his blood pressure and heart rate. I'll see him again in 6 month.  I'll see him in 6 months .    Current medicines are reviewed at length with the patient today.  The patient does not have concerns regarding medicines.  The following changes have been made:    Labs/ tests ordered today include:  No orders of the defined types were placed in this encounter.    Disposition:   FU with me in 6 months .     Mertie Moores, MD  05/19/2015 8:34 AM    [address], [place], [place]  [address] Phone: [phone]; Fax: [phone]   [facility]  [address] [place], [place]  [address] [phone]   Fax [phone]

## 2015-05-19 NOTE — Patient Instructions (Signed)
Medication Instructions:  STOP Aspirin STOP Cardura   Labwork: None Ordered   Testing/Procedures: None Ordered   Follow-Up: Your physician wants you to follow-up in: 6 months with Dr. Acie Fredrickson.  You will receive a reminder letter in the mail two months in advance. If you don't receive a letter, please call our office to schedule the follow-up appointment.   If you need a refill on your cardiac medications before your next appointment, please call your pharmacy.   Thank you for choosing [facility]! Christen Bame, RN [phone]

## 2015-05-20 DIAGNOSIS — R296 Repeated falls: Secondary | ICD-10-CM | POA: Diagnosis not present

## 2015-05-20 DIAGNOSIS — C61 Malignant neoplasm of prostate: Secondary | ICD-10-CM | POA: Diagnosis not present

## 2015-05-20 DIAGNOSIS — R413 Other amnesia: Secondary | ICD-10-CM | POA: Diagnosis not present

## 2015-05-20 DIAGNOSIS — N312 Flaccid neuropathic bladder, not elsewhere classified: Secondary | ICD-10-CM | POA: Diagnosis not present

## 2015-05-20 DIAGNOSIS — E119 Type 2 diabetes mellitus without complications: Secondary | ICD-10-CM | POA: Diagnosis not present

## 2015-05-20 DIAGNOSIS — M6281 Muscle weakness (generalized): Secondary | ICD-10-CM | POA: Diagnosis not present

## 2015-05-21 DIAGNOSIS — C61 Malignant neoplasm of prostate: Secondary | ICD-10-CM | POA: Diagnosis not present

## 2015-05-21 DIAGNOSIS — N312 Flaccid neuropathic bladder, not elsewhere classified: Secondary | ICD-10-CM | POA: Diagnosis not present

## 2015-05-21 DIAGNOSIS — R296 Repeated falls: Secondary | ICD-10-CM | POA: Diagnosis not present

## 2015-05-21 DIAGNOSIS — E119 Type 2 diabetes mellitus without complications: Secondary | ICD-10-CM | POA: Diagnosis not present

## 2015-05-21 DIAGNOSIS — R413 Other amnesia: Secondary | ICD-10-CM | POA: Diagnosis not present

## 2015-05-21 DIAGNOSIS — M6281 Muscle weakness (generalized): Secondary | ICD-10-CM | POA: Diagnosis not present

## 2015-05-22 DIAGNOSIS — M6281 Muscle weakness (generalized): Secondary | ICD-10-CM | POA: Diagnosis not present

## 2015-05-22 DIAGNOSIS — R413 Other amnesia: Secondary | ICD-10-CM | POA: Diagnosis not present

## 2015-05-22 DIAGNOSIS — R296 Repeated falls: Secondary | ICD-10-CM | POA: Diagnosis not present

## 2015-05-22 DIAGNOSIS — C61 Malignant neoplasm of prostate: Secondary | ICD-10-CM | POA: Diagnosis not present

## 2015-05-22 DIAGNOSIS — N312 Flaccid neuropathic bladder, not elsewhere classified: Secondary | ICD-10-CM | POA: Diagnosis not present

## 2015-05-22 DIAGNOSIS — E119 Type 2 diabetes mellitus without complications: Secondary | ICD-10-CM | POA: Diagnosis not present

## 2015-05-26 DIAGNOSIS — R296 Repeated falls: Secondary | ICD-10-CM | POA: Diagnosis not present

## 2015-05-26 DIAGNOSIS — R413 Other amnesia: Secondary | ICD-10-CM | POA: Diagnosis not present

## 2015-05-26 DIAGNOSIS — C61 Malignant neoplasm of prostate: Secondary | ICD-10-CM | POA: Diagnosis not present

## 2015-05-26 DIAGNOSIS — E119 Type 2 diabetes mellitus without complications: Secondary | ICD-10-CM | POA: Diagnosis not present

## 2015-05-26 DIAGNOSIS — M6281 Muscle weakness (generalized): Secondary | ICD-10-CM | POA: Diagnosis not present

## 2015-05-26 DIAGNOSIS — N312 Flaccid neuropathic bladder, not elsewhere classified: Secondary | ICD-10-CM | POA: Diagnosis not present

## 2015-05-27 DIAGNOSIS — M6281 Muscle weakness (generalized): Secondary | ICD-10-CM | POA: Diagnosis not present

## 2015-05-27 DIAGNOSIS — R296 Repeated falls: Secondary | ICD-10-CM | POA: Diagnosis not present

## 2015-05-27 DIAGNOSIS — E119 Type 2 diabetes mellitus without complications: Secondary | ICD-10-CM | POA: Diagnosis not present

## 2015-05-27 DIAGNOSIS — R413 Other amnesia: Secondary | ICD-10-CM | POA: Diagnosis not present

## 2015-05-27 DIAGNOSIS — C61 Malignant neoplasm of prostate: Secondary | ICD-10-CM | POA: Diagnosis not present

## 2015-05-27 DIAGNOSIS — N312 Flaccid neuropathic bladder, not elsewhere classified: Secondary | ICD-10-CM | POA: Diagnosis not present

## 2015-05-28 DIAGNOSIS — N312 Flaccid neuropathic bladder, not elsewhere classified: Secondary | ICD-10-CM | POA: Diagnosis not present

## 2015-05-28 DIAGNOSIS — M6281 Muscle weakness (generalized): Secondary | ICD-10-CM | POA: Diagnosis not present

## 2015-05-28 DIAGNOSIS — C61 Malignant neoplasm of prostate: Secondary | ICD-10-CM | POA: Diagnosis not present

## 2015-05-28 DIAGNOSIS — M25559 Pain in unspecified hip: Secondary | ICD-10-CM | POA: Diagnosis not present

## 2015-05-28 DIAGNOSIS — R296 Repeated falls: Secondary | ICD-10-CM | POA: Diagnosis not present

## 2015-05-28 DIAGNOSIS — R413 Other amnesia: Secondary | ICD-10-CM | POA: Diagnosis not present

## 2015-05-28 DIAGNOSIS — E119 Type 2 diabetes mellitus without complications: Secondary | ICD-10-CM | POA: Diagnosis not present

## 2015-05-29 DIAGNOSIS — M25559 Pain in unspecified hip: Secondary | ICD-10-CM | POA: Diagnosis not present

## 2015-06-02 DIAGNOSIS — M6281 Muscle weakness (generalized): Secondary | ICD-10-CM | POA: Diagnosis not present

## 2015-06-02 DIAGNOSIS — N312 Flaccid neuropathic bladder, not elsewhere classified: Secondary | ICD-10-CM | POA: Diagnosis not present

## 2015-06-02 DIAGNOSIS — R296 Repeated falls: Secondary | ICD-10-CM | POA: Diagnosis not present

## 2015-06-02 DIAGNOSIS — E119 Type 2 diabetes mellitus without complications: Secondary | ICD-10-CM | POA: Diagnosis not present

## 2015-06-02 DIAGNOSIS — C61 Malignant neoplasm of prostate: Secondary | ICD-10-CM | POA: Diagnosis not present

## 2015-06-02 DIAGNOSIS — R413 Other amnesia: Secondary | ICD-10-CM | POA: Diagnosis not present

## 2015-06-03 DIAGNOSIS — M6281 Muscle weakness (generalized): Secondary | ICD-10-CM | POA: Diagnosis not present

## 2015-06-03 DIAGNOSIS — R413 Other amnesia: Secondary | ICD-10-CM | POA: Diagnosis not present

## 2015-06-03 DIAGNOSIS — C61 Malignant neoplasm of prostate: Secondary | ICD-10-CM | POA: Diagnosis not present

## 2015-06-03 DIAGNOSIS — E119 Type 2 diabetes mellitus without complications: Secondary | ICD-10-CM | POA: Diagnosis not present

## 2015-06-03 DIAGNOSIS — R296 Repeated falls: Secondary | ICD-10-CM | POA: Diagnosis not present

## 2015-06-03 DIAGNOSIS — N312 Flaccid neuropathic bladder, not elsewhere classified: Secondary | ICD-10-CM | POA: Diagnosis not present

## 2015-06-04 DIAGNOSIS — R413 Other amnesia: Secondary | ICD-10-CM | POA: Diagnosis not present

## 2015-06-04 DIAGNOSIS — C61 Malignant neoplasm of prostate: Secondary | ICD-10-CM | POA: Diagnosis not present

## 2015-06-04 DIAGNOSIS — R296 Repeated falls: Secondary | ICD-10-CM | POA: Diagnosis not present

## 2015-06-04 DIAGNOSIS — E119 Type 2 diabetes mellitus without complications: Secondary | ICD-10-CM | POA: Diagnosis not present

## 2015-06-04 DIAGNOSIS — N312 Flaccid neuropathic bladder, not elsewhere classified: Secondary | ICD-10-CM | POA: Diagnosis not present

## 2015-06-04 DIAGNOSIS — M6281 Muscle weakness (generalized): Secondary | ICD-10-CM | POA: Diagnosis not present

## 2015-06-05 DIAGNOSIS — R296 Repeated falls: Secondary | ICD-10-CM | POA: Diagnosis not present

## 2015-06-05 DIAGNOSIS — C61 Malignant neoplasm of prostate: Secondary | ICD-10-CM | POA: Diagnosis not present

## 2015-06-05 DIAGNOSIS — R413 Other amnesia: Secondary | ICD-10-CM | POA: Diagnosis not present

## 2015-06-05 DIAGNOSIS — E119 Type 2 diabetes mellitus without complications: Secondary | ICD-10-CM | POA: Diagnosis not present

## 2015-06-05 DIAGNOSIS — M6281 Muscle weakness (generalized): Secondary | ICD-10-CM | POA: Diagnosis not present

## 2015-06-05 DIAGNOSIS — N312 Flaccid neuropathic bladder, not elsewhere classified: Secondary | ICD-10-CM | POA: Diagnosis not present

## 2015-06-07 DIAGNOSIS — E119 Type 2 diabetes mellitus without complications: Secondary | ICD-10-CM | POA: Diagnosis not present

## 2015-06-07 DIAGNOSIS — R413 Other amnesia: Secondary | ICD-10-CM | POA: Diagnosis not present

## 2015-06-07 DIAGNOSIS — R296 Repeated falls: Secondary | ICD-10-CM | POA: Diagnosis not present

## 2015-06-07 DIAGNOSIS — C61 Malignant neoplasm of prostate: Secondary | ICD-10-CM | POA: Diagnosis not present

## 2015-06-07 DIAGNOSIS — M6281 Muscle weakness (generalized): Secondary | ICD-10-CM | POA: Diagnosis not present

## 2015-06-07 DIAGNOSIS — N312 Flaccid neuropathic bladder, not elsewhere classified: Secondary | ICD-10-CM | POA: Diagnosis not present

## 2015-06-08 DIAGNOSIS — R413 Other amnesia: Secondary | ICD-10-CM | POA: Diagnosis not present

## 2015-06-08 DIAGNOSIS — R296 Repeated falls: Secondary | ICD-10-CM | POA: Diagnosis not present

## 2015-06-08 DIAGNOSIS — E119 Type 2 diabetes mellitus without complications: Secondary | ICD-10-CM | POA: Diagnosis not present

## 2015-06-08 DIAGNOSIS — N312 Flaccid neuropathic bladder, not elsewhere classified: Secondary | ICD-10-CM | POA: Diagnosis not present

## 2015-06-08 DIAGNOSIS — C61 Malignant neoplasm of prostate: Secondary | ICD-10-CM | POA: Diagnosis not present

## 2015-06-08 DIAGNOSIS — M6281 Muscle weakness (generalized): Secondary | ICD-10-CM | POA: Diagnosis not present

## 2015-06-09 DIAGNOSIS — M25559 Pain in unspecified hip: Secondary | ICD-10-CM | POA: Diagnosis not present

## 2015-06-10 DIAGNOSIS — E119 Type 2 diabetes mellitus without complications: Secondary | ICD-10-CM | POA: Diagnosis not present

## 2015-06-10 DIAGNOSIS — R296 Repeated falls: Secondary | ICD-10-CM | POA: Diagnosis not present

## 2015-06-10 DIAGNOSIS — C61 Malignant neoplasm of prostate: Secondary | ICD-10-CM | POA: Diagnosis not present

## 2015-06-10 DIAGNOSIS — M6281 Muscle weakness (generalized): Secondary | ICD-10-CM | POA: Diagnosis not present

## 2015-06-10 DIAGNOSIS — R413 Other amnesia: Secondary | ICD-10-CM | POA: Diagnosis not present

## 2015-06-10 DIAGNOSIS — N312 Flaccid neuropathic bladder, not elsewhere classified: Secondary | ICD-10-CM | POA: Diagnosis not present

## 2015-06-12 DIAGNOSIS — R296 Repeated falls: Secondary | ICD-10-CM | POA: Diagnosis not present

## 2015-06-12 DIAGNOSIS — E119 Type 2 diabetes mellitus without complications: Secondary | ICD-10-CM | POA: Diagnosis not present

## 2015-06-12 DIAGNOSIS — R413 Other amnesia: Secondary | ICD-10-CM | POA: Diagnosis not present

## 2015-06-12 DIAGNOSIS — M6281 Muscle weakness (generalized): Secondary | ICD-10-CM | POA: Diagnosis not present

## 2015-06-12 DIAGNOSIS — N312 Flaccid neuropathic bladder, not elsewhere classified: Secondary | ICD-10-CM | POA: Diagnosis not present

## 2015-06-12 DIAGNOSIS — C61 Malignant neoplasm of prostate: Secondary | ICD-10-CM | POA: Diagnosis not present

## 2015-06-16 DIAGNOSIS — E119 Type 2 diabetes mellitus without complications: Secondary | ICD-10-CM | POA: Diagnosis not present

## 2015-06-16 DIAGNOSIS — R413 Other amnesia: Secondary | ICD-10-CM | POA: Diagnosis not present

## 2015-06-16 DIAGNOSIS — C61 Malignant neoplasm of prostate: Secondary | ICD-10-CM | POA: Diagnosis not present

## 2015-06-16 DIAGNOSIS — R296 Repeated falls: Secondary | ICD-10-CM | POA: Diagnosis not present

## 2015-06-16 DIAGNOSIS — N312 Flaccid neuropathic bladder, not elsewhere classified: Secondary | ICD-10-CM | POA: Diagnosis not present

## 2015-06-16 DIAGNOSIS — M6281 Muscle weakness (generalized): Secondary | ICD-10-CM | POA: Diagnosis not present

## 2015-06-17 DIAGNOSIS — R296 Repeated falls: Secondary | ICD-10-CM | POA: Diagnosis not present

## 2015-06-17 DIAGNOSIS — R413 Other amnesia: Secondary | ICD-10-CM | POA: Diagnosis not present

## 2015-06-17 DIAGNOSIS — N312 Flaccid neuropathic bladder, not elsewhere classified: Secondary | ICD-10-CM | POA: Diagnosis not present

## 2015-06-17 DIAGNOSIS — M6281 Muscle weakness (generalized): Secondary | ICD-10-CM | POA: Diagnosis not present

## 2015-06-17 DIAGNOSIS — C61 Malignant neoplasm of prostate: Secondary | ICD-10-CM | POA: Diagnosis not present

## 2015-06-17 DIAGNOSIS — E119 Type 2 diabetes mellitus without complications: Secondary | ICD-10-CM | POA: Diagnosis not present

## 2015-06-18 DIAGNOSIS — E119 Type 2 diabetes mellitus without complications: Secondary | ICD-10-CM | POA: Diagnosis not present

## 2015-06-18 DIAGNOSIS — R296 Repeated falls: Secondary | ICD-10-CM | POA: Diagnosis not present

## 2015-06-18 DIAGNOSIS — R413 Other amnesia: Secondary | ICD-10-CM | POA: Diagnosis not present

## 2015-06-18 DIAGNOSIS — C61 Malignant neoplasm of prostate: Secondary | ICD-10-CM | POA: Diagnosis not present

## 2015-06-18 DIAGNOSIS — M6281 Muscle weakness (generalized): Secondary | ICD-10-CM | POA: Diagnosis not present

## 2015-06-18 DIAGNOSIS — N312 Flaccid neuropathic bladder, not elsewhere classified: Secondary | ICD-10-CM | POA: Diagnosis not present

## 2015-06-19 DIAGNOSIS — N312 Flaccid neuropathic bladder, not elsewhere classified: Secondary | ICD-10-CM | POA: Diagnosis not present

## 2015-06-19 DIAGNOSIS — M6281 Muscle weakness (generalized): Secondary | ICD-10-CM | POA: Diagnosis not present

## 2015-06-19 DIAGNOSIS — R413 Other amnesia: Secondary | ICD-10-CM | POA: Diagnosis not present

## 2015-06-19 DIAGNOSIS — C61 Malignant neoplasm of prostate: Secondary | ICD-10-CM | POA: Diagnosis not present

## 2015-06-19 DIAGNOSIS — E119 Type 2 diabetes mellitus without complications: Secondary | ICD-10-CM | POA: Diagnosis not present

## 2015-06-19 DIAGNOSIS — R296 Repeated falls: Secondary | ICD-10-CM | POA: Diagnosis not present

## 2015-06-22 DIAGNOSIS — M6281 Muscle weakness (generalized): Secondary | ICD-10-CM | POA: Diagnosis not present

## 2015-06-22 DIAGNOSIS — C61 Malignant neoplasm of prostate: Secondary | ICD-10-CM | POA: Diagnosis not present

## 2015-06-22 DIAGNOSIS — R296 Repeated falls: Secondary | ICD-10-CM | POA: Diagnosis not present

## 2015-06-22 DIAGNOSIS — R413 Other amnesia: Secondary | ICD-10-CM | POA: Diagnosis not present

## 2015-06-22 DIAGNOSIS — E119 Type 2 diabetes mellitus without complications: Secondary | ICD-10-CM | POA: Diagnosis not present

## 2015-06-22 DIAGNOSIS — N312 Flaccid neuropathic bladder, not elsewhere classified: Secondary | ICD-10-CM | POA: Diagnosis not present

## 2015-06-23 DIAGNOSIS — R296 Repeated falls: Secondary | ICD-10-CM | POA: Diagnosis not present

## 2015-06-23 DIAGNOSIS — E119 Type 2 diabetes mellitus without complications: Secondary | ICD-10-CM | POA: Diagnosis not present

## 2015-06-23 DIAGNOSIS — R413 Other amnesia: Secondary | ICD-10-CM | POA: Diagnosis not present

## 2015-06-23 DIAGNOSIS — M6281 Muscle weakness (generalized): Secondary | ICD-10-CM | POA: Diagnosis not present

## 2015-06-23 DIAGNOSIS — N312 Flaccid neuropathic bladder, not elsewhere classified: Secondary | ICD-10-CM | POA: Diagnosis not present

## 2015-06-23 DIAGNOSIS — C61 Malignant neoplasm of prostate: Secondary | ICD-10-CM | POA: Diagnosis not present

## 2015-06-24 DIAGNOSIS — R8290 Unspecified abnormal findings in urine: Secondary | ICD-10-CM | POA: Diagnosis not present

## 2015-06-24 DIAGNOSIS — N312 Flaccid neuropathic bladder, not elsewhere classified: Secondary | ICD-10-CM | POA: Diagnosis not present

## 2015-06-24 DIAGNOSIS — R413 Other amnesia: Secondary | ICD-10-CM | POA: Diagnosis not present

## 2015-06-24 DIAGNOSIS — E119 Type 2 diabetes mellitus without complications: Secondary | ICD-10-CM | POA: Diagnosis not present

## 2015-06-24 DIAGNOSIS — R296 Repeated falls: Secondary | ICD-10-CM | POA: Diagnosis not present

## 2015-06-24 DIAGNOSIS — C61 Malignant neoplasm of prostate: Secondary | ICD-10-CM | POA: Diagnosis not present

## 2015-06-24 DIAGNOSIS — M6281 Muscle weakness (generalized): Secondary | ICD-10-CM | POA: Diagnosis not present

## 2015-06-26 DIAGNOSIS — C61 Malignant neoplasm of prostate: Secondary | ICD-10-CM | POA: Diagnosis not present

## 2015-06-26 DIAGNOSIS — E119 Type 2 diabetes mellitus without complications: Secondary | ICD-10-CM | POA: Diagnosis not present

## 2015-06-26 DIAGNOSIS — N390 Urinary tract infection, site not specified: Secondary | ICD-10-CM | POA: Diagnosis not present

## 2015-06-26 DIAGNOSIS — R296 Repeated falls: Secondary | ICD-10-CM | POA: Diagnosis not present

## 2015-06-26 DIAGNOSIS — M6281 Muscle weakness (generalized): Secondary | ICD-10-CM | POA: Diagnosis not present

## 2015-06-26 DIAGNOSIS — R413 Other amnesia: Secondary | ICD-10-CM | POA: Diagnosis not present

## 2015-06-26 DIAGNOSIS — N312 Flaccid neuropathic bladder, not elsewhere classified: Secondary | ICD-10-CM | POA: Diagnosis not present

## 2015-06-29 DIAGNOSIS — M6281 Muscle weakness (generalized): Secondary | ICD-10-CM | POA: Diagnosis not present

## 2015-06-29 DIAGNOSIS — E119 Type 2 diabetes mellitus without complications: Secondary | ICD-10-CM | POA: Diagnosis not present

## 2015-06-29 DIAGNOSIS — N312 Flaccid neuropathic bladder, not elsewhere classified: Secondary | ICD-10-CM | POA: Diagnosis not present

## 2015-06-29 DIAGNOSIS — R296 Repeated falls: Secondary | ICD-10-CM | POA: Diagnosis not present

## 2015-06-29 DIAGNOSIS — C61 Malignant neoplasm of prostate: Secondary | ICD-10-CM | POA: Diagnosis not present

## 2015-06-29 DIAGNOSIS — R413 Other amnesia: Secondary | ICD-10-CM | POA: Diagnosis not present

## 2015-07-01 DIAGNOSIS — R296 Repeated falls: Secondary | ICD-10-CM | POA: Diagnosis not present

## 2015-07-01 DIAGNOSIS — M6281 Muscle weakness (generalized): Secondary | ICD-10-CM | POA: Diagnosis not present

## 2015-07-01 DIAGNOSIS — R413 Other amnesia: Secondary | ICD-10-CM | POA: Diagnosis not present

## 2015-07-01 DIAGNOSIS — N312 Flaccid neuropathic bladder, not elsewhere classified: Secondary | ICD-10-CM | POA: Diagnosis not present

## 2015-07-01 DIAGNOSIS — C61 Malignant neoplasm of prostate: Secondary | ICD-10-CM | POA: Diagnosis not present

## 2015-07-01 DIAGNOSIS — E119 Type 2 diabetes mellitus without complications: Secondary | ICD-10-CM | POA: Diagnosis not present

## 2015-07-02 DIAGNOSIS — R413 Other amnesia: Secondary | ICD-10-CM | POA: Diagnosis not present

## 2015-07-02 DIAGNOSIS — R296 Repeated falls: Secondary | ICD-10-CM | POA: Diagnosis not present

## 2015-07-02 DIAGNOSIS — N312 Flaccid neuropathic bladder, not elsewhere classified: Secondary | ICD-10-CM | POA: Diagnosis not present

## 2015-07-02 DIAGNOSIS — C61 Malignant neoplasm of prostate: Secondary | ICD-10-CM | POA: Diagnosis not present

## 2015-07-02 DIAGNOSIS — E119 Type 2 diabetes mellitus without complications: Secondary | ICD-10-CM | POA: Diagnosis not present

## 2015-07-02 DIAGNOSIS — M6281 Muscle weakness (generalized): Secondary | ICD-10-CM | POA: Diagnosis not present

## 2015-07-03 DIAGNOSIS — C61 Malignant neoplasm of prostate: Secondary | ICD-10-CM | POA: Diagnosis not present

## 2015-07-03 DIAGNOSIS — R296 Repeated falls: Secondary | ICD-10-CM | POA: Diagnosis not present

## 2015-07-03 DIAGNOSIS — R413 Other amnesia: Secondary | ICD-10-CM | POA: Diagnosis not present

## 2015-07-03 DIAGNOSIS — N312 Flaccid neuropathic bladder, not elsewhere classified: Secondary | ICD-10-CM | POA: Diagnosis not present

## 2015-07-03 DIAGNOSIS — M6281 Muscle weakness (generalized): Secondary | ICD-10-CM | POA: Diagnosis not present

## 2015-07-03 DIAGNOSIS — E119 Type 2 diabetes mellitus without complications: Secondary | ICD-10-CM | POA: Diagnosis not present

## 2015-07-06 DIAGNOSIS — C61 Malignant neoplasm of prostate: Secondary | ICD-10-CM | POA: Diagnosis not present

## 2015-07-06 DIAGNOSIS — N312 Flaccid neuropathic bladder, not elsewhere classified: Secondary | ICD-10-CM | POA: Diagnosis not present

## 2015-07-06 DIAGNOSIS — E119 Type 2 diabetes mellitus without complications: Secondary | ICD-10-CM | POA: Diagnosis not present

## 2015-07-06 DIAGNOSIS — R296 Repeated falls: Secondary | ICD-10-CM | POA: Diagnosis not present

## 2015-07-06 DIAGNOSIS — R413 Other amnesia: Secondary | ICD-10-CM | POA: Diagnosis not present

## 2015-07-06 DIAGNOSIS — M6281 Muscle weakness (generalized): Secondary | ICD-10-CM | POA: Diagnosis not present

## 2015-07-08 DIAGNOSIS — C61 Malignant neoplasm of prostate: Secondary | ICD-10-CM | POA: Diagnosis not present

## 2015-07-08 DIAGNOSIS — R413 Other amnesia: Secondary | ICD-10-CM | POA: Diagnosis not present

## 2015-07-08 DIAGNOSIS — N312 Flaccid neuropathic bladder, not elsewhere classified: Secondary | ICD-10-CM | POA: Diagnosis not present

## 2015-07-08 DIAGNOSIS — E119 Type 2 diabetes mellitus without complications: Secondary | ICD-10-CM | POA: Diagnosis not present

## 2015-07-08 DIAGNOSIS — M6281 Muscle weakness (generalized): Secondary | ICD-10-CM | POA: Diagnosis not present

## 2015-07-08 DIAGNOSIS — R296 Repeated falls: Secondary | ICD-10-CM | POA: Diagnosis not present

## 2015-07-09 DIAGNOSIS — N312 Flaccid neuropathic bladder, not elsewhere classified: Secondary | ICD-10-CM | POA: Diagnosis not present

## 2015-07-09 DIAGNOSIS — R413 Other amnesia: Secondary | ICD-10-CM | POA: Diagnosis not present

## 2015-07-09 DIAGNOSIS — E119 Type 2 diabetes mellitus without complications: Secondary | ICD-10-CM | POA: Diagnosis not present

## 2015-07-09 DIAGNOSIS — C61 Malignant neoplasm of prostate: Secondary | ICD-10-CM | POA: Diagnosis not present

## 2015-07-09 DIAGNOSIS — R296 Repeated falls: Secondary | ICD-10-CM | POA: Diagnosis not present

## 2015-07-09 DIAGNOSIS — M6281 Muscle weakness (generalized): Secondary | ICD-10-CM | POA: Diagnosis not present

## 2015-07-10 DIAGNOSIS — R0602 Shortness of breath: Secondary | ICD-10-CM | POA: Diagnosis not present

## 2015-07-10 DIAGNOSIS — N312 Flaccid neuropathic bladder, not elsewhere classified: Secondary | ICD-10-CM | POA: Diagnosis not present

## 2015-07-10 DIAGNOSIS — E119 Type 2 diabetes mellitus without complications: Secondary | ICD-10-CM | POA: Diagnosis not present

## 2015-07-10 DIAGNOSIS — R413 Other amnesia: Secondary | ICD-10-CM | POA: Diagnosis not present

## 2015-07-10 DIAGNOSIS — R609 Edema, unspecified: Secondary | ICD-10-CM | POA: Diagnosis not present

## 2015-07-10 DIAGNOSIS — R296 Repeated falls: Secondary | ICD-10-CM | POA: Diagnosis not present

## 2015-07-10 DIAGNOSIS — C61 Malignant neoplasm of prostate: Secondary | ICD-10-CM | POA: Diagnosis not present

## 2015-07-10 DIAGNOSIS — M6281 Muscle weakness (generalized): Secondary | ICD-10-CM | POA: Diagnosis not present

## 2015-07-14 DIAGNOSIS — C61 Malignant neoplasm of prostate: Secondary | ICD-10-CM | POA: Diagnosis not present

## 2015-07-14 DIAGNOSIS — N312 Flaccid neuropathic bladder, not elsewhere classified: Secondary | ICD-10-CM | POA: Diagnosis not present

## 2015-07-14 DIAGNOSIS — M6281 Muscle weakness (generalized): Secondary | ICD-10-CM | POA: Diagnosis not present

## 2015-07-14 DIAGNOSIS — R296 Repeated falls: Secondary | ICD-10-CM | POA: Diagnosis not present

## 2015-07-14 DIAGNOSIS — N390 Urinary tract infection, site not specified: Secondary | ICD-10-CM | POA: Diagnosis not present

## 2015-07-14 DIAGNOSIS — R413 Other amnesia: Secondary | ICD-10-CM | POA: Diagnosis not present

## 2015-07-14 DIAGNOSIS — E119 Type 2 diabetes mellitus without complications: Secondary | ICD-10-CM | POA: Diagnosis not present

## 2015-07-15 DIAGNOSIS — E119 Type 2 diabetes mellitus without complications: Secondary | ICD-10-CM | POA: Diagnosis not present

## 2015-07-15 DIAGNOSIS — R296 Repeated falls: Secondary | ICD-10-CM | POA: Diagnosis not present

## 2015-07-15 DIAGNOSIS — M6281 Muscle weakness (generalized): Secondary | ICD-10-CM | POA: Diagnosis not present

## 2015-07-15 DIAGNOSIS — R413 Other amnesia: Secondary | ICD-10-CM | POA: Diagnosis not present

## 2015-07-15 DIAGNOSIS — N312 Flaccid neuropathic bladder, not elsewhere classified: Secondary | ICD-10-CM | POA: Diagnosis not present

## 2015-07-15 DIAGNOSIS — C61 Malignant neoplasm of prostate: Secondary | ICD-10-CM | POA: Diagnosis not present

## 2015-07-16 DIAGNOSIS — M6281 Muscle weakness (generalized): Secondary | ICD-10-CM | POA: Diagnosis not present

## 2015-07-16 DIAGNOSIS — C61 Malignant neoplasm of prostate: Secondary | ICD-10-CM | POA: Diagnosis not present

## 2015-07-16 DIAGNOSIS — E119 Type 2 diabetes mellitus without complications: Secondary | ICD-10-CM | POA: Diagnosis not present

## 2015-07-16 DIAGNOSIS — R296 Repeated falls: Secondary | ICD-10-CM | POA: Diagnosis not present

## 2015-07-16 DIAGNOSIS — N312 Flaccid neuropathic bladder, not elsewhere classified: Secondary | ICD-10-CM | POA: Diagnosis not present

## 2015-07-16 DIAGNOSIS — R413 Other amnesia: Secondary | ICD-10-CM | POA: Diagnosis not present

## 2015-07-17 DIAGNOSIS — R296 Repeated falls: Secondary | ICD-10-CM | POA: Diagnosis not present

## 2015-07-17 DIAGNOSIS — M6281 Muscle weakness (generalized): Secondary | ICD-10-CM | POA: Diagnosis not present

## 2015-07-17 DIAGNOSIS — N312 Flaccid neuropathic bladder, not elsewhere classified: Secondary | ICD-10-CM | POA: Diagnosis not present

## 2015-07-17 DIAGNOSIS — C61 Malignant neoplasm of prostate: Secondary | ICD-10-CM | POA: Diagnosis not present

## 2015-07-17 DIAGNOSIS — R413 Other amnesia: Secondary | ICD-10-CM | POA: Diagnosis not present

## 2015-07-17 DIAGNOSIS — E119 Type 2 diabetes mellitus without complications: Secondary | ICD-10-CM | POA: Diagnosis not present

## 2015-07-23 DIAGNOSIS — I350 Nonrheumatic aortic (valve) stenosis: Secondary | ICD-10-CM | POA: Diagnosis not present

## 2015-07-23 DIAGNOSIS — Z466 Encounter for fitting and adjustment of urinary device: Secondary | ICD-10-CM | POA: Diagnosis not present

## 2015-07-23 DIAGNOSIS — N390 Urinary tract infection, site not specified: Secondary | ICD-10-CM | POA: Diagnosis not present

## 2015-07-23 DIAGNOSIS — C61 Malignant neoplasm of prostate: Secondary | ICD-10-CM | POA: Diagnosis not present

## 2015-07-23 DIAGNOSIS — N312 Flaccid neuropathic bladder, not elsewhere classified: Secondary | ICD-10-CM | POA: Diagnosis not present

## 2015-07-23 DIAGNOSIS — E119 Type 2 diabetes mellitus without complications: Secondary | ICD-10-CM | POA: Diagnosis not present

## 2015-07-28 DIAGNOSIS — C61 Malignant neoplasm of prostate: Secondary | ICD-10-CM | POA: Diagnosis not present

## 2015-08-06 DIAGNOSIS — Z466 Encounter for fitting and adjustment of urinary device: Secondary | ICD-10-CM | POA: Diagnosis not present

## 2015-08-06 DIAGNOSIS — E119 Type 2 diabetes mellitus without complications: Secondary | ICD-10-CM | POA: Diagnosis not present

## 2015-08-06 DIAGNOSIS — N312 Flaccid neuropathic bladder, not elsewhere classified: Secondary | ICD-10-CM | POA: Diagnosis not present

## 2015-08-06 DIAGNOSIS — I350 Nonrheumatic aortic (valve) stenosis: Secondary | ICD-10-CM | POA: Diagnosis not present

## 2015-08-06 DIAGNOSIS — N390 Urinary tract infection, site not specified: Secondary | ICD-10-CM | POA: Diagnosis not present

## 2015-08-06 DIAGNOSIS — C61 Malignant neoplasm of prostate: Secondary | ICD-10-CM | POA: Diagnosis not present

## 2015-08-12 DIAGNOSIS — R531 Weakness: Secondary | ICD-10-CM | POA: Diagnosis not present

## 2015-08-12 DIAGNOSIS — I350 Nonrheumatic aortic (valve) stenosis: Secondary | ICD-10-CM | POA: Diagnosis not present

## 2015-08-12 DIAGNOSIS — N312 Flaccid neuropathic bladder, not elsewhere classified: Secondary | ICD-10-CM | POA: Diagnosis not present

## 2015-08-12 DIAGNOSIS — C61 Malignant neoplasm of prostate: Secondary | ICD-10-CM | POA: Diagnosis not present

## 2015-08-12 DIAGNOSIS — N390 Urinary tract infection, site not specified: Secondary | ICD-10-CM | POA: Diagnosis not present

## 2015-08-12 DIAGNOSIS — Z466 Encounter for fitting and adjustment of urinary device: Secondary | ICD-10-CM | POA: Diagnosis not present

## 2015-08-12 DIAGNOSIS — E119 Type 2 diabetes mellitus without complications: Secondary | ICD-10-CM | POA: Diagnosis not present

## 2015-08-25 DIAGNOSIS — N312 Flaccid neuropathic bladder, not elsewhere classified: Secondary | ICD-10-CM | POA: Diagnosis not present

## 2015-08-25 DIAGNOSIS — C61 Malignant neoplasm of prostate: Secondary | ICD-10-CM | POA: Diagnosis not present

## 2015-09-02 DIAGNOSIS — I350 Nonrheumatic aortic (valve) stenosis: Secondary | ICD-10-CM | POA: Diagnosis not present

## 2015-09-02 DIAGNOSIS — E119 Type 2 diabetes mellitus without complications: Secondary | ICD-10-CM | POA: Diagnosis not present

## 2015-09-02 DIAGNOSIS — Z466 Encounter for fitting and adjustment of urinary device: Secondary | ICD-10-CM | POA: Diagnosis not present

## 2015-09-02 DIAGNOSIS — N312 Flaccid neuropathic bladder, not elsewhere classified: Secondary | ICD-10-CM | POA: Diagnosis not present

## 2015-09-02 DIAGNOSIS — N390 Urinary tract infection, site not specified: Secondary | ICD-10-CM | POA: Diagnosis not present

## 2015-09-02 DIAGNOSIS — C61 Malignant neoplasm of prostate: Secondary | ICD-10-CM | POA: Diagnosis not present

## 2015-09-04 DIAGNOSIS — C61 Malignant neoplasm of prostate: Secondary | ICD-10-CM | POA: Diagnosis not present

## 2015-09-04 DIAGNOSIS — N390 Urinary tract infection, site not specified: Secondary | ICD-10-CM | POA: Diagnosis not present

## 2015-09-04 DIAGNOSIS — Z466 Encounter for fitting and adjustment of urinary device: Secondary | ICD-10-CM | POA: Diagnosis not present

## 2015-09-04 DIAGNOSIS — N312 Flaccid neuropathic bladder, not elsewhere classified: Secondary | ICD-10-CM | POA: Diagnosis not present

## 2015-09-04 DIAGNOSIS — E119 Type 2 diabetes mellitus without complications: Secondary | ICD-10-CM | POA: Diagnosis not present

## 2015-09-04 DIAGNOSIS — I350 Nonrheumatic aortic (valve) stenosis: Secondary | ICD-10-CM | POA: Diagnosis not present

## 2015-09-09 DIAGNOSIS — E119 Type 2 diabetes mellitus without complications: Secondary | ICD-10-CM | POA: Diagnosis not present

## 2015-09-09 DIAGNOSIS — R8290 Unspecified abnormal findings in urine: Secondary | ICD-10-CM | POA: Diagnosis not present

## 2015-09-09 DIAGNOSIS — C61 Malignant neoplasm of prostate: Secondary | ICD-10-CM | POA: Diagnosis not present

## 2015-09-09 DIAGNOSIS — N312 Flaccid neuropathic bladder, not elsewhere classified: Secondary | ICD-10-CM | POA: Diagnosis not present

## 2015-09-09 DIAGNOSIS — I350 Nonrheumatic aortic (valve) stenosis: Secondary | ICD-10-CM | POA: Diagnosis not present

## 2015-09-09 DIAGNOSIS — N390 Urinary tract infection, site not specified: Secondary | ICD-10-CM | POA: Diagnosis not present

## 2015-09-09 DIAGNOSIS — Z466 Encounter for fitting and adjustment of urinary device: Secondary | ICD-10-CM | POA: Diagnosis not present

## 2015-09-10 DIAGNOSIS — R600 Localized edema: Secondary | ICD-10-CM | POA: Diagnosis not present

## 2015-09-10 DIAGNOSIS — R531 Weakness: Secondary | ICD-10-CM | POA: Diagnosis not present

## 2015-09-10 DIAGNOSIS — E7800 Pure hypercholesterolemia, unspecified: Secondary | ICD-10-CM | POA: Diagnosis not present

## 2015-09-10 DIAGNOSIS — Z23 Encounter for immunization: Secondary | ICD-10-CM | POA: Diagnosis not present

## 2015-09-10 DIAGNOSIS — I10 Essential (primary) hypertension: Secondary | ICD-10-CM | POA: Diagnosis not present

## 2015-09-10 DIAGNOSIS — E119 Type 2 diabetes mellitus without complications: Secondary | ICD-10-CM | POA: Diagnosis not present

## 2015-09-10 DIAGNOSIS — Z7984 Long term (current) use of oral hypoglycemic drugs: Secondary | ICD-10-CM | POA: Diagnosis not present

## 2015-09-14 DIAGNOSIS — N390 Urinary tract infection, site not specified: Secondary | ICD-10-CM | POA: Diagnosis not present

## 2015-09-14 DIAGNOSIS — Z466 Encounter for fitting and adjustment of urinary device: Secondary | ICD-10-CM | POA: Diagnosis not present

## 2015-09-14 DIAGNOSIS — C61 Malignant neoplasm of prostate: Secondary | ICD-10-CM | POA: Diagnosis not present

## 2015-09-14 DIAGNOSIS — I350 Nonrheumatic aortic (valve) stenosis: Secondary | ICD-10-CM | POA: Diagnosis not present

## 2015-09-14 DIAGNOSIS — E119 Type 2 diabetes mellitus without complications: Secondary | ICD-10-CM | POA: Diagnosis not present

## 2015-09-14 DIAGNOSIS — N312 Flaccid neuropathic bladder, not elsewhere classified: Secondary | ICD-10-CM | POA: Diagnosis not present

## 2015-09-15 ENCOUNTER — Emergency Department (HOSPITAL_COMMUNITY)
Admission: EM | Admit: 2015-09-15 | Discharge: 2015-09-15 | Disposition: A | Discharge status: Home / Self Care | Payer: Commercial Managed Care - HMO | Attending: Emergency Medicine | Admitting: Emergency Medicine

## 2015-09-15 ENCOUNTER — Encounter (HOSPITAL_COMMUNITY): Payer: Self-pay | Admitting: Emergency Medicine

## 2015-09-15 DIAGNOSIS — K5900 Constipation, unspecified: Secondary | ICD-10-CM | POA: Diagnosis not present

## 2015-09-15 DIAGNOSIS — Z79899 Other long term (current) drug therapy: Secondary | ICD-10-CM | POA: Insufficient documentation

## 2015-09-15 DIAGNOSIS — R4182 Altered mental status, unspecified: Secondary | ICD-10-CM | POA: Diagnosis not present

## 2015-09-15 DIAGNOSIS — E119 Type 2 diabetes mellitus without complications: Secondary | ICD-10-CM | POA: Diagnosis not present

## 2015-09-15 DIAGNOSIS — Z7984 Long term (current) use of oral hypoglycemic drugs: Secondary | ICD-10-CM | POA: Diagnosis not present

## 2015-09-15 DIAGNOSIS — R10819 Abdominal tenderness, unspecified site: Secondary | ICD-10-CM | POA: Diagnosis not present

## 2015-09-15 DIAGNOSIS — Z8546 Personal history of malignant neoplasm of prostate: Secondary | ICD-10-CM | POA: Diagnosis not present

## 2015-09-15 DIAGNOSIS — Z87891 Personal history of nicotine dependence: Secondary | ICD-10-CM | POA: Insufficient documentation

## 2015-09-15 DIAGNOSIS — E8770 Fluid overload, unspecified: Secondary | ICD-10-CM | POA: Diagnosis not present

## 2015-09-15 DIAGNOSIS — R1030 Lower abdominal pain, unspecified: Secondary | ICD-10-CM | POA: Diagnosis present

## 2015-09-15 DIAGNOSIS — R339 Retention of urine, unspecified: Secondary | ICD-10-CM | POA: Diagnosis not present

## 2015-09-15 DIAGNOSIS — K2970 Gastritis, unspecified, without bleeding: Secondary | ICD-10-CM | POA: Diagnosis not present

## 2015-09-15 DIAGNOSIS — R1084 Generalized abdominal pain: Secondary | ICD-10-CM | POA: Diagnosis not present

## 2015-09-15 LAB — URINALYSIS, ROUTINE W REFLEX MICROSCOPIC
Bilirubin Urine: NEGATIVE
GLUCOSE, UA: NEGATIVE mg/dL
HGB URINE DIPSTICK: NEGATIVE
KETONES UR: NEGATIVE mg/dL
Nitrite: NEGATIVE
PROTEIN: 30 mg/dL — AB
Specific Gravity, Urine: 1.014 (ref 1.005–1.030)
pH: 8.5 — ABNORMAL HIGH (ref 5.0–8.0)

## 2015-09-15 LAB — I-STAT CHEM 8, ED
BUN: 24 mg/dL — ABNORMAL HIGH (ref 6–20)
CREATININE: 1 mg/dL (ref 0.61–1.24)
Calcium, Ion: 1.1 mmol/L — ABNORMAL LOW (ref 1.15–1.40)
Chloride: 95 mmol/L — ABNORMAL LOW (ref 101–111)
Glucose, Bld: 121 mg/dL — ABNORMAL HIGH (ref 65–99)
HEMATOCRIT: 38 % — AB (ref 39.0–52.0)
HEMOGLOBIN: 12.9 g/dL — AB (ref 13.0–17.0)
Potassium: 3.7 mmol/L (ref 3.5–5.1)
SODIUM: 139 mmol/L (ref 135–145)
TCO2: 30 mmol/L (ref 0–100)

## 2015-09-15 LAB — URINE MICROSCOPIC-ADD ON: Squamous Epithelial / LPF: NONE SEEN

## 2015-09-15 NOTE — ED Triage Notes (Addendum)
Per EMS pt from [place] at [facility] for evaluation of foley catheter not draining appropriately and also pt reporting suprapubic pain that started tonight.

## 2015-09-15 NOTE — ED Notes (Signed)
Previous 16French foley catheter removed that was inserted prior to arrival. 16French Coude catheter inserted without difficulty and immediate return of 929ml of yellow urine in bag.

## 2015-09-15 NOTE — ED Notes (Signed)
Dr.Palumbo notified of urine output from catheter.

## 2015-09-15 NOTE — ED Notes (Signed)
[facility] [employer] notified of need for transport of pt back to residence.  

## 2015-09-15 NOTE — ED Notes (Signed)
Bed: [id number] Expected date:  Expected time:  Means of arrival:  Comments: 82 yo M  abd pain

## 2015-09-15 NOTE — ED Provider Notes (Signed)
[facility] DEPT Provider Note   CSN: [id number] Arrival date & time: 09/15/15  0053 By signing my name below, I, Dyke Brackett, attest that this documentation has been prepared under the direction and in the presence of Cozetta Seif, MD . Electronically Signed: Dyke Brackett, Scribe. 09/15/2015. 1:17 AM.   History   Chief Complaint Chief Complaint  Patient presents with  . Urinary Retention  . Abdominal Pain    HPI Chestley Peeler is a 82 y.o. male who presents to the Emergency Department for evaluation of his foley catheter placed today. Per pt, foley is not draining. He also notes associated suprapubic pain which began tonight, difficulty urinating and constipation. No alleviating or modifying factors noted.   The history is provided by the patient. No language interpreter was used.  Abdominal Pain   This is a new problem. The current episode started 12 to 24 hours ago. The problem occurs constantly. The problem has not changed since onset.The pain is associated with an unknown factor. The pain is located in the suprapubic region. The pain is severe. Associated symptoms include constipation. Pertinent negatives include flatus, hematochezia and hematuria.    Past Medical History:  Diagnosis Date  . Aortic stenosis   . Bilateral leg edema   . Cardiac murmur   . Combined fat and carbohydrate induced hyperlipemia 10/28/2014  . Diabetes mellitus without complication ([place])   . Dyslipidemia   . Elevated brain natriuretic peptide (BNP) level 09/01/2014  . Hyperlipidemia 10/28/2014  . LBP (low back pain) 10/28/2014  . Neurogenic bladder    self cath at home  . Prostate cancer ([place])   . Vitamin B deficiency     Patient Active Problem List   Diagnosis Date Noted  . Aortic stenosis 11/17/2014  . Atrial fibrillation ([place]) 11/17/2014  . Other specified diabetes mellitus without complications ([place]) 123XX123  . Diabetes type 2, controlled ([place]) 10/28/2014  . Diabetes ([place])  10/28/2014  . Hyperlipidemia 10/28/2014  . Accumulation of fluid in tissues 10/28/2014  . Herpes zoster without complication [id number]  . LBP (low back pain) 10/28/2014  . Combined fat and carbohydrate induced hyperlipemia 10/28/2014  . Type 2 diabetes mellitus without complications ([place]) [id number]  . Localized edema 09/01/2014  . Elevated brain natriuretic peptide (BNP) level 09/01/2014  . Fluid overload 09/01/2014    Past Surgical History:  Procedure Laterality Date  . ORIF HUMERUS FRACTURE     Open reduction and internal fixation of transcondylar distal humerus fracture with intercondylar extension   . RADIAL HEAD ARTHROPLASTY        Home Medications    Prior to Admission medications   Medication Sig Start Date End Date Taking? Authorizing Provider  apixaban (ELIQUIS) 5 MG TABS tablet Take 1 tablet (5 mg total) by mouth 2 (two) times daily. 11/17/14   Thayer Headings, MD  bicalutamide (CASODEX) 50 MG tablet Take 1 tablet by mouth daily.    Historical Provider, MD  cyanocobalamin (,VITAMIN B-12,) 1000 MCG/ML injection Inject 1,000 mcg into the muscle once a week. 03/06/15   Historical Provider, MD  furosemide (LASIX) 40 MG tablet Take 1 tablet by mouth 2 (two) times daily.  03/25/10   Historical Provider, MD  HYDROcodone-acetaminophen (NORCO/VICODIN) 5-325 MG tablet Take 1 tablet by mouth 2 (two) times daily as needed. 03/09/15   Historical Provider, MD  Leuprolide Acetate, 6 Month, (LUPRON DEPOT) 45 MG injection Every 4 months    Historical Provider, MD  metFORMIN (GLUCOPHAGE-XR) 500 MG 24 hr tablet Take 500  mg by mouth 2 (two) times daily. 02/02/15   Historical Provider, MD  metoprolol succinate (TOPROL XL) 25 MG 24 hr tablet Take 0.5 tablets (12.5 mg total) by mouth daily. 02/17/15   Thayer Headings, MD  pravastatin (PRAVACHOL) 10 MG tablet Take 1 tablet by mouth at bedtime.     Historical Provider, MD    Family History No family history on file.  Social History Social History   Substance Use Topics  . Smoking status: Former Research scientist (life sciences)  . Smokeless tobacco: Not on file  . Alcohol use No     Allergies   Review of patient's allergies indicates no known allergies.  Review of Systems Review of Systems  Gastrointestinal: Positive for abdominal pain and constipation. Negative for flatus and hematochezia.  Genitourinary: Positive for difficulty urinating. Negative for hematuria.  All other systems reviewed and are negative.  Physical Exam Updated Vital Signs BP 157/89 (BP Location: Left Arm)   Pulse 69   Temp 97.6 F (36.4 C) (Oral)   Resp 17   Ht 5\' 7"  (1.702 m)   Wt 200 lb (90.7 kg)   SpO2 92%   BMI 31.32 kg/m   Physical Exam  Constitutional: He is oriented to person, place, and time. He appears well-developed and well-nourished. No distress.  HENT:  Head: Normocephalic and atraumatic.  Mouth/Throat: Oropharynx is clear and moist. No oropharyngeal exudate.  Moist mucous membranes   Eyes: Conjunctivae are normal. Pupils are equal, round, and reactive to light.  Neck: No JVD present.  Trachea midline No bruit  Cardiovascular: Normal rate, regular rhythm and normal heart sounds.   Pulmonary/Chest: Effort normal and breath sounds normal. No stridor. No respiratory distress. He has no wheezes. He has no rales.  Abdominal: Soft. Bowel sounds are normal. He exhibits no distension. There is no tenderness. There is no rebound and no guarding.  Bladder enlarged on exam  Neurological: He is alert and oriented to person, place, and time. He has normal reflexes.  Skin: Skin is warm and dry.  Psychiatric: He has a normal mood and affect. His behavior is normal.  Nursing note and vitals reviewed.   ED Treatments / Results  DIAGNOSTIC STUDIES: Vitals:   09/15/15 0056  BP: 157/89  Pulse: 69  Resp: 17  Temp: 97.6 F (36.4 C)   Results for orders placed or performed during the hospital encounter of 09/15/15  Urinalysis, Routine w reflex microscopic (not at  [facility])  Result Value Ref Range   Color, Urine YELLOW YELLOW   APPearance CLOUDY (A) CLEAR   Specific Gravity, Urine 1.014 1.005 - 1.030   pH 8.5 (H) 5.0 - 8.0   Glucose, UA NEGATIVE NEGATIVE mg/dL   Hgb urine dipstick NEGATIVE NEGATIVE   Bilirubin Urine NEGATIVE NEGATIVE   Ketones, ur NEGATIVE NEGATIVE mg/dL   Protein, ur 30 (A) NEGATIVE mg/dL   Nitrite NEGATIVE NEGATIVE   Leukocytes, UA LARGE (A) NEGATIVE  Urine microscopic-add on  Result Value Ref Range   Squamous Epithelial / LPF NONE SEEN NONE SEEN   WBC, UA 0-5 0 - 5 WBC/hpf   RBC / HPF 0-5 0 - 5 RBC/hpf   Bacteria, UA FEW (A) NONE SEEN   Crystals TRIPLE PHOSPHATE CRYSTALS (A) NEGATIVE   Urine-Other AMORPHOUS URATES/PHOSPHATES   I-Stat Chem 8, ED  Result Value Ref Range   Sodium 139 135 - 145 mmol/L   Potassium 3.7 3.5 - 5.1 mmol/L   Chloride 95 (L) 101 - 111 mmol/L   BUN 24 (  H) 6 - 20 mg/dL   Creatinine, Ser 1.00 0.61 - 1.24 mg/dL   Glucose, Bld 121 (H) 65 - 99 mg/dL   Calcium, Ion 1.10 (L) 1.15 - 1.40 mmol/L   TCO2 30 0 - 100 mmol/L   Hemoglobin 12.9 (L) 13.0 - 17.0 g/dL   HCT 38.0 (L) 39.0 - 52.0 %   No results found.   COORDINATION OF CARE:  1:14 AM Will order UA and I-stat Chem 8. Discussed treatment plan with pt at bedside and pt agreed to plan.   Labs (all labs ordered are listed, but only abnormal results are displayed) Labs Reviewed - No data to display   Radiology No results found.  Procedures Procedures (including critical care time)  Medications Ordered in ED Medications - No data to display   Initial Impression / Assessment and Plan / ED Course  I have reviewed the triage vital signs and the nursing notes.  Pertinent labs & imaging results that were available during my care of the patient were reviewed by me and considered in my medical decision making (see chart for details).  Clinical Course    Foley inserted and immediately 900 cc into the bag.  Clamped for 30 minutes to prevent  hypotension.  In total 1600 cc in the ED out.  Patient feeling better.    Final Clinical Impressions(s) / ED Diagnoses   Final diagnoses:  None   All questions answered to patient's satisfaction. Based on history and exam patient has been appropriately medically screened and emergency conditions excluded. Patient is stable for discharge at this time. Follow up with your PMD for recheck in 2 days and strict return precautions given  I personally performed the services described in this documentation, which was scribed in my presence. The recorded information has been reviewed and is accurate.    New Prescriptions New Prescriptions   No medications on file     Jeorgia Helming, MD 09/15/15 [phone]

## 2015-09-16 DIAGNOSIS — N390 Urinary tract infection, site not specified: Secondary | ICD-10-CM | POA: Diagnosis not present

## 2015-09-16 DIAGNOSIS — I350 Nonrheumatic aortic (valve) stenosis: Secondary | ICD-10-CM | POA: Diagnosis not present

## 2015-09-16 DIAGNOSIS — Z466 Encounter for fitting and adjustment of urinary device: Secondary | ICD-10-CM | POA: Diagnosis not present

## 2015-09-16 DIAGNOSIS — N312 Flaccid neuropathic bladder, not elsewhere classified: Secondary | ICD-10-CM | POA: Diagnosis not present

## 2015-09-16 DIAGNOSIS — C61 Malignant neoplasm of prostate: Secondary | ICD-10-CM | POA: Diagnosis not present

## 2015-09-16 DIAGNOSIS — R531 Weakness: Secondary | ICD-10-CM | POA: Diagnosis not present

## 2015-09-16 DIAGNOSIS — E119 Type 2 diabetes mellitus without complications: Secondary | ICD-10-CM | POA: Diagnosis not present

## 2015-09-22 DIAGNOSIS — E538 Deficiency of other specified B group vitamins: Secondary | ICD-10-CM | POA: Diagnosis not present

## 2015-09-22 DIAGNOSIS — E119 Type 2 diabetes mellitus without complications: Secondary | ICD-10-CM | POA: Diagnosis not present

## 2015-09-22 DIAGNOSIS — N312 Flaccid neuropathic bladder, not elsewhere classified: Secondary | ICD-10-CM | POA: Diagnosis not present

## 2015-09-22 DIAGNOSIS — Z466 Encounter for fitting and adjustment of urinary device: Secondary | ICD-10-CM | POA: Diagnosis not present

## 2015-09-22 DIAGNOSIS — R338 Other retention of urine: Secondary | ICD-10-CM | POA: Diagnosis not present

## 2015-09-22 DIAGNOSIS — C61 Malignant neoplasm of prostate: Secondary | ICD-10-CM | POA: Diagnosis not present

## 2015-09-24 DIAGNOSIS — N312 Flaccid neuropathic bladder, not elsewhere classified: Secondary | ICD-10-CM | POA: Diagnosis not present

## 2015-09-28 DIAGNOSIS — Z466 Encounter for fitting and adjustment of urinary device: Secondary | ICD-10-CM | POA: Diagnosis not present

## 2015-09-28 DIAGNOSIS — E119 Type 2 diabetes mellitus without complications: Secondary | ICD-10-CM | POA: Diagnosis not present

## 2015-09-28 DIAGNOSIS — E538 Deficiency of other specified B group vitamins: Secondary | ICD-10-CM | POA: Diagnosis not present

## 2015-09-28 DIAGNOSIS — C61 Malignant neoplasm of prostate: Secondary | ICD-10-CM | POA: Diagnosis not present

## 2015-09-28 DIAGNOSIS — R338 Other retention of urine: Secondary | ICD-10-CM | POA: Diagnosis not present

## 2015-09-28 DIAGNOSIS — N312 Flaccid neuropathic bladder, not elsewhere classified: Secondary | ICD-10-CM | POA: Diagnosis not present

## 2015-10-02 DIAGNOSIS — C61 Malignant neoplasm of prostate: Secondary | ICD-10-CM | POA: Diagnosis not present

## 2015-10-02 DIAGNOSIS — R338 Other retention of urine: Secondary | ICD-10-CM | POA: Diagnosis not present

## 2015-10-02 DIAGNOSIS — E538 Deficiency of other specified B group vitamins: Secondary | ICD-10-CM | POA: Diagnosis not present

## 2015-10-02 DIAGNOSIS — N312 Flaccid neuropathic bladder, not elsewhere classified: Secondary | ICD-10-CM | POA: Diagnosis not present

## 2015-10-02 DIAGNOSIS — E119 Type 2 diabetes mellitus without complications: Secondary | ICD-10-CM | POA: Diagnosis not present

## 2015-10-02 DIAGNOSIS — Z466 Encounter for fitting and adjustment of urinary device: Secondary | ICD-10-CM | POA: Diagnosis not present

## 2015-10-03 ENCOUNTER — Encounter (HOSPITAL_COMMUNITY): Payer: Self-pay | Admitting: Emergency Medicine

## 2015-10-03 ENCOUNTER — Emergency Department (HOSPITAL_COMMUNITY)
Admission: EM | Admit: 2015-10-03 | Discharge: 2015-10-03 | Disposition: A | Discharge status: Home / Self Care | Payer: Commercial Managed Care - HMO | Attending: Emergency Medicine | Admitting: Emergency Medicine

## 2015-10-03 DIAGNOSIS — E119 Type 2 diabetes mellitus without complications: Secondary | ICD-10-CM | POA: Insufficient documentation

## 2015-10-03 DIAGNOSIS — T83091A Other mechanical complication of indwelling urethral catheter, initial encounter: Secondary | ICD-10-CM

## 2015-10-03 DIAGNOSIS — Y732 Prosthetic and other implants, materials and accessory gastroenterology and urology devices associated with adverse incidents: Secondary | ICD-10-CM | POA: Diagnosis not present

## 2015-10-03 DIAGNOSIS — Z87891 Personal history of nicotine dependence: Secondary | ICD-10-CM | POA: Insufficient documentation

## 2015-10-03 DIAGNOSIS — Z79899 Other long term (current) drug therapy: Secondary | ICD-10-CM | POA: Diagnosis not present

## 2015-10-03 DIAGNOSIS — Z8546 Personal history of malignant neoplasm of prostate: Secondary | ICD-10-CM | POA: Diagnosis not present

## 2015-10-03 DIAGNOSIS — R339 Retention of urine, unspecified: Secondary | ICD-10-CM | POA: Diagnosis not present

## 2015-10-03 DIAGNOSIS — T83098A Other mechanical complication of other indwelling urethral catheter, initial encounter: Secondary | ICD-10-CM | POA: Insufficient documentation

## 2015-10-03 DIAGNOSIS — Z7984 Long term (current) use of oral hypoglycemic drugs: Secondary | ICD-10-CM | POA: Insufficient documentation

## 2015-10-03 DIAGNOSIS — K2970 Gastritis, unspecified, without bleeding: Secondary | ICD-10-CM | POA: Diagnosis not present

## 2015-10-03 DIAGNOSIS — R10819 Abdominal tenderness, unspecified site: Secondary | ICD-10-CM | POA: Diagnosis not present

## 2015-10-03 LAB — URINALYSIS, ROUTINE W REFLEX MICROSCOPIC
Bilirubin Urine: NEGATIVE
GLUCOSE, UA: NEGATIVE mg/dL
Ketones, ur: NEGATIVE mg/dL
Nitrite: NEGATIVE
Protein, ur: NEGATIVE mg/dL
SPECIFIC GRAVITY, URINE: 1.011 (ref 1.005–1.030)
pH: 8.5 — ABNORMAL HIGH (ref 5.0–8.0)

## 2015-10-03 LAB — I-STAT CHEM 8, ED
BUN: 18 mg/dL (ref 6–20)
CALCIUM ION: 1.07 mmol/L — AB (ref 1.15–1.40)
CHLORIDE: 97 mmol/L — AB (ref 101–111)
CREATININE: 0.8 mg/dL (ref 0.61–1.24)
GLUCOSE: 106 mg/dL — AB (ref 65–99)
HCT: 37 % — ABNORMAL LOW (ref 39.0–52.0)
Hemoglobin: 12.6 g/dL — ABNORMAL LOW (ref 13.0–17.0)
Potassium: 3.4 mmol/L — ABNORMAL LOW (ref 3.5–5.1)
Sodium: 139 mmol/L (ref 135–145)
TCO2: 30 mmol/L (ref 0–100)

## 2015-10-03 LAB — URINE MICROSCOPIC-ADD ON
Bacteria, UA: NONE SEEN
RBC / HPF: NONE SEEN RBC/hpf (ref 0–5)

## 2015-10-03 MED ORDER — LIDOCAINE HCL 2 % EX GEL
1.0000 | Freq: Once | CUTANEOUS | Status: AC | PRN
Start: 2015-10-03 — End: 2015-10-03
  Administered 2015-10-03: 1 via URETHRAL
  Filled 2015-10-03: qty 11

## 2015-10-03 NOTE — ED Notes (Signed)
Report given to Kennyth Lose, med tech @ [facility].

## 2015-10-03 NOTE — ED Triage Notes (Signed)
Pt from [facility] via ems with complaints of lower abdominal pain and no urine output from foley catheter x 8 hours per facility

## 2015-10-03 NOTE — ED Notes (Signed)
[facility] called for transportation  

## 2015-10-03 NOTE — ED Provider Notes (Signed)
[facility] DEPT Provider Note   CSN: [id number] Arrival date & time: 10/03/15  0207   By signing my name below, I, Maud Deed. Royston Sinner, attest that this documentation has been prepared under the direction and in the presence of Davonna Belling, MD.  Electronically Signed: Maud Deed. Royston Sinner, ED Scribe. 10/03/15. 3:22 AM.   History   Chief Complaint Chief Complaint  Patient presents with  . Urinary Retention   The history is provided by the patient. No language interpreter was used.   HPI Comments: Parker [name], brought in by EMS from [facility] Laming is a 82 y.o. male with a PMHx of prostate cancer who presents to the Emergency Department here for urinary retention this evening. Pt reports decreased urinary output from foley catheter x 1 day. Pt states he last noted flow into urinary bag sometime yesterday. Mild abdominal discomfort reported per triage note. No interventions attempted en route to department or prior to EMS arrival. No recent fever, chills, nausea, or vomiting. Pt was recently evaluated on 9/12 in the Emergency Department for same.  Past Medical History:  Diagnosis Date  . Aortic stenosis   . Bilateral leg edema   . Cardiac murmur   . Combined fat and carbohydrate induced hyperlipemia 10/28/2014  . Diabetes mellitus without complication ([place])   . Dyslipidemia   . Elevated brain natriuretic peptide (BNP) level 09/01/2014  . Hyperlipidemia 10/28/2014  . LBP (low back pain) 10/28/2014  . Neurogenic bladder    self cath at home  . Prostate cancer ([place])   . Vitamin B deficiency     Patient Active Problem List   Diagnosis Date Noted  . Aortic stenosis 11/17/2014  . Atrial fibrillation ([place]) 11/17/2014  . Other specified diabetes mellitus without complications ([place]) 123XX123  . Diabetes type 2, controlled ([place]) 10/28/2014  . Diabetes ([place]) 10/28/2014  . Hyperlipidemia 10/28/2014  . Accumulation of fluid in tissues 10/28/2014  . Herpes zoster without complication  123XX123  . LBP (low back pain) 10/28/2014  . Combined fat and carbohydrate induced hyperlipemia 10/28/2014  . Type 2 diabetes mellitus without complications ([place]) 123XX123  . Localized edema 09/01/2014  . Elevated brain natriuretic peptide (BNP) level 09/01/2014  . Fluid overload 09/01/2014    Past Surgical History:  Procedure Laterality Date  . ORIF HUMERUS FRACTURE     Open reduction and internal fixation of transcondylar distal humerus fracture with intercondylar extension   . RADIAL HEAD ARTHROPLASTY         Home Medications    Prior to Admission medications   Medication Sig Start Date End Date Taking? Authorizing Provider  acetaminophen (TYLENOL) 500 MG tablet Take 500 mg by mouth 3 (three) times daily.    Historical Provider, MD  apixaban (ELIQUIS) 5 MG TABS tablet Take 1 tablet (5 mg total) by mouth 2 (two) times daily. 11/17/14   Thayer Headings, MD  bicalutamide (CASODEX) 50 MG tablet Take 1 tablet by mouth daily.    Historical Provider, MD  cyanocobalamin (,VITAMIN B-12,) 1000 MCG/ML injection Inject 1,000 mcg into the muscle every 30 (thirty) days.  03/06/15   Historical Provider, MD  furosemide (LASIX) 40 MG tablet Take 1 tablet by mouth 2 (two) times daily.  03/25/10   Historical Provider, MD  HYDROcodone-acetaminophen (NORCO/VICODIN) 5-325 MG tablet Take 1 tablet by mouth 2 (two) times daily as needed. 03/09/15   Historical Provider, MD  metFORMIN (GLUCOPHAGE-XR) 500 MG 24 hr tablet Take 500 mg by mouth daily.  02/02/15   Historical Provider,  MD  metoprolol succinate (TOPROL XL) 25 MG 24 hr tablet Take 0.5 tablets (12.5 mg total) by mouth daily. 02/17/15   Thayer Headings, MD  polyethylene glycol [facility] / Floria Raveling) packet Take 17 g by mouth daily as needed (for constipation).    Historical Provider, MD  pravastatin (PRAVACHOL) 10 MG tablet Take 10 mg by mouth at bedtime.     Historical Provider, MD  Sennosides-Docusate Sodium (SENEXON-S PO) Take 1 tablet by mouth daily.  Hold for loose stools    Historical Provider, MD    Family History No family history on file.  Social History Social History  Substance Use Topics  . Smoking status: Former Research scientist (life sciences)  . Smokeless tobacco: Not on file  . Alcohol use No     Allergies   Review of patient's allergies indicates no known allergies.   Review of Systems Review of Systems  Constitutional: Negative for chills and fever.  Gastrointestinal: Negative for nausea and vomiting.  Genitourinary: Positive for difficulty urinating.  All other systems reviewed and are negative.    Physical Exam Updated Vital Signs BP 142/85 (BP Location: Left Arm)   Pulse 70   Temp 98.1 F (36.7 C) (Oral)   Resp 20   SpO2 96%   Physical Exam  Constitutional: He is oriented to person, place, and time. He appears well-developed and well-nourished.  HENT:  Head: Normocephalic and atraumatic.  Eyes: EOM are normal.  Neck: Normal range of motion.  Cardiovascular: Normal rate, regular rhythm, normal heart sounds and intact distal pulses.   Pulmonary/Chest: Effort normal and breath sounds normal. No respiratory distress.  Abdominal: Soft. He exhibits no distension and no mass. There is no tenderness.  Musculoskeletal: Normal range of motion.  Neurological: He is alert and oriented to person, place, and time.  Skin: Skin is warm and dry. There is erythema.  Mild erythema noted to penis  Psychiatric: He has a normal mood and affect. Judgment normal.  Nursing note and vitals reviewed.    ED Treatments / Results  DIAGNOSTIC STUDIES: Oxygen Saturation is 96% on RA, adequate by my interpretation.    COORDINATION OF CARE: 3:17 AM- Will order blood work. Discussed treatment plan with pt at bedside and pt agreed to plan.    Labs (all labs ordered are listed, but only abnormal results are displayed) Labs Reviewed  URINALYSIS, ROUTINE W REFLEX MICROSCOPIC (NOT AT [facility])    EKG  EKG Interpretation None        Radiology No results found.  Procedures Procedures (including critical care time)  Medications Ordered in ED Medications  lidocaine (XYLOCAINE) 2 % jelly 1 application (not administered)     Initial Impression / Assessment and Plan / ED Course  I have reviewed the triage vital signs and the nursing notes.  Pertinent labs & imaging results that were available during my care of the patient were reviewed by me and considered in my medical decision making (see chart for details).  Clinical Course    Patient with urinary retention. Foley catheter had become obstructed. Replaced. No urinary tract infection and no apparent    renal failure at this time. Discharge home to follow-up with urology.Final Clinical Impressions(s) / ED Diagnoses   Final diagnoses:  None    New Prescriptions New Prescriptions   No medications on file  I personally performed the services described in this documentation, which was scribed in my presence. The recorded information has been reviewed and is accurate.      Ovid Curd  Alvino Chapel, MD 10/03/15 [phone]

## 2015-10-03 NOTE — ED Notes (Signed)
Bed: [id number] Expected date:  Expected time:  Means of arrival:  Comments: EMS 82yo M abd pain / urinary retention

## 2015-10-03 NOTE — ED Notes (Signed)
Patient d/c'd back to [facility].  Report called to Kennyth Lose, F/U reviewed with caregiver.  Reviewed D/C instructions with patient, patient verbalized understanding.

## 2015-10-03 NOTE — ED Notes (Signed)
This RN attempted to flush foley catheter with no luck. Former [place] was removed and replaced. Immediate return of over 1 L

## 2015-10-07 ENCOUNTER — Encounter (HOSPITAL_COMMUNITY): Payer: Self-pay | Admitting: Emergency Medicine

## 2015-10-07 ENCOUNTER — Emergency Department (HOSPITAL_COMMUNITY)
Admission: EM | Admit: 2015-10-07 | Discharge: 2015-10-08 | Disposition: A | Discharge status: Home / Self Care | Payer: Commercial Managed Care - HMO | Attending: Emergency Medicine | Admitting: Emergency Medicine

## 2015-10-07 DIAGNOSIS — R339 Retention of urine, unspecified: Secondary | ICD-10-CM | POA: Diagnosis not present

## 2015-10-07 DIAGNOSIS — Z87891 Personal history of nicotine dependence: Secondary | ICD-10-CM | POA: Insufficient documentation

## 2015-10-07 DIAGNOSIS — R319 Hematuria, unspecified: Secondary | ICD-10-CM | POA: Diagnosis present

## 2015-10-07 DIAGNOSIS — Z7984 Long term (current) use of oral hypoglycemic drugs: Secondary | ICD-10-CM | POA: Diagnosis not present

## 2015-10-07 DIAGNOSIS — Z7901 Long term (current) use of anticoagulants: Secondary | ICD-10-CM | POA: Insufficient documentation

## 2015-10-07 DIAGNOSIS — N3091 Cystitis, unspecified with hematuria: Secondary | ICD-10-CM

## 2015-10-07 DIAGNOSIS — Z79899 Other long term (current) drug therapy: Secondary | ICD-10-CM | POA: Diagnosis not present

## 2015-10-07 DIAGNOSIS — E119 Type 2 diabetes mellitus without complications: Secondary | ICD-10-CM | POA: Insufficient documentation

## 2015-10-07 DIAGNOSIS — T83198A Other mechanical complication of other urinary devices and implants, initial encounter: Secondary | ICD-10-CM | POA: Diagnosis not present

## 2015-10-07 DIAGNOSIS — Z8546 Personal history of malignant neoplasm of prostate: Secondary | ICD-10-CM | POA: Diagnosis not present

## 2015-10-07 DIAGNOSIS — N3090 Cystitis, unspecified without hematuria: Secondary | ICD-10-CM | POA: Insufficient documentation

## 2015-10-07 DIAGNOSIS — T83498A Other mechanical complication of other prosthetic devices, implants and grafts of genital tract, initial encounter: Secondary | ICD-10-CM | POA: Diagnosis not present

## 2015-10-07 LAB — BASIC METABOLIC PANEL
ANION GAP: 7 (ref 5–15)
BUN: 23 mg/dL — ABNORMAL HIGH (ref 6–20)
CALCIUM: 8.8 mg/dL — AB (ref 8.9–10.3)
CO2: 32 mmol/L (ref 22–32)
CREATININE: 0.94 mg/dL (ref 0.61–1.24)
Chloride: 96 mmol/L — ABNORMAL LOW (ref 101–111)
GFR calc Af Amer: >60 mL/min (ref >60)
GLUCOSE: 123 mg/dL — AB (ref 65–99)
Potassium: 3.9 mmol/L (ref 3.5–5.1)
Sodium: 135 mmol/L (ref 135–145)

## 2015-10-07 LAB — CBC
HCT: 33.5 % — ABNORMAL LOW (ref 39.0–52.0)
Hemoglobin: 11 g/dL — ABNORMAL LOW (ref 13.0–17.0)
MCH: 29.9 pg (ref 26.0–34.0)
MCHC: 32.8 g/dL (ref 30.0–36.0)
MCV: 91 fL (ref 78.0–100.0)
PLATELETS: 223 10*3/uL (ref 150–400)
RBC: 3.68 MIL/uL — ABNORMAL LOW (ref 4.22–5.81)
RDW: 14.7 % (ref 11.5–15.5)
WBC: 11.3 10*3/uL — AB (ref 4.0–10.5)

## 2015-10-07 LAB — URINALYSIS, ROUTINE W REFLEX MICROSCOPIC
BILIRUBIN URINE: NEGATIVE
GLUCOSE, UA: NEGATIVE mg/dL
KETONES UR: NEGATIVE mg/dL
Nitrite: NEGATIVE
PH: 8 (ref 5.0–8.0)
PROTEIN: 30 mg/dL — AB
Specific Gravity, Urine: 1.013 (ref 1.005–1.030)

## 2015-10-07 LAB — URINE MICROSCOPIC-ADD ON

## 2015-10-07 MED ORDER — CIPROFLOXACIN HCL 500 MG PO TABS
500.0000 mg | ORAL_TABLET | Freq: Once | ORAL | Status: AC
  Administered 2015-10-08: 500 mg via ORAL
  Filled 2015-10-07: qty 1

## 2015-10-07 NOTE — ED Notes (Signed)
Bed: [id number] Expected date:  Expected time:  Means of arrival:  Comments: 82 yr old, hematuria/low output

## 2015-10-07 NOTE — ED Triage Notes (Signed)
Patient is complaining of blood in urine. Patient has not had any urine output today. Patient is at baseline. Patient is from [place] at [facility]

## 2015-10-07 NOTE — ED Provider Notes (Signed)
[facility] DEPT Provider Note   CSN: [id number] Arrival date & time: 10/07/15  2147  By signing my name below, I, Reola Mosher, attest that this documentation has been prepared under the direction and in the presence of Orpah Greek, MD. Electronically Signed: Reola Mosher, ED Scribe. 10/07/15. 10:04 PM.  History   Chief Complaint Chief Complaint  Patient presents with  . Hematuria   The history is provided by the patient and the nursing home. No language interpreter was used.   HPI Comments: Parker [name] is a 82 y.o. male BIB EMS from [facility], with a PMHx of prostate cancer and Foley catheter placement, DM2, who presents to the Emergency Department complaining of intermittent episodes of hematuria beginning earlier today. He reports associated suprapubic abdominal pain and urinary retention secondary to the onset of his hematuria. Pt was seen in the ED ~5 days ago for worsening urinary retention where it was found that his Foley catheter had become obstructed and was replaced. His UA was negative at that time. No treatments were tried prior to coming into the ED via pt or EMS. Denies dysuria, or any other associated symptoms.   Pt additionally is c/o a gradually worsening area of thickened growth to the dorsal aspect of his right hand that he would like to have examined.   Past Medical History:  Diagnosis Date  . Aortic stenosis   . Bilateral leg edema   . Cardiac murmur   . Combined fat and carbohydrate induced hyperlipemia 10/28/2014  . Diabetes mellitus without complication ([place])   . Dyslipidemia   . Elevated brain natriuretic peptide (BNP) level 09/01/2014  . Hyperlipidemia 10/28/2014  . LBP (low back pain) 10/28/2014  . Neurogenic bladder    self cath at home  . Prostate cancer ([place])   . Vitamin B deficiency    Patient Active Problem List   Diagnosis Date Noted  . Aortic stenosis 11/17/2014  . Atrial fibrillation ([place]) 11/17/2014  . Other  specified diabetes mellitus without complications 123XX123  . Diabetes type 2, controlled ([place]) 10/28/2014  . Diabetes ([place]) 10/28/2014  . Hyperlipidemia 10/28/2014  . Accumulation of fluid in tissues 10/28/2014  . Herpes zoster without complication 123XX123  . LBP (low back pain) 10/28/2014  . Combined fat and carbohydrate induced hyperlipemia 10/28/2014  . Type 2 diabetes mellitus without complications ([place]) 123XX123  . Localized edema 09/01/2014  . Elevated brain natriuretic peptide (BNP) level 09/01/2014  . Fluid overload 09/01/2014   Past Surgical History:  Procedure Laterality Date  . ORIF HUMERUS FRACTURE     Open reduction and internal fixation of transcondylar distal humerus fracture with intercondylar extension   . RADIAL HEAD ARTHROPLASTY      Home Medications    Prior to Admission medications   Medication Sig Start Date End Date Taking? Authorizing Provider  acetaminophen (TYLENOL) 500 MG tablet Take 500 mg by mouth 3 (three) times daily.    Historical Provider, MD  apixaban (ELIQUIS) 5 MG TABS tablet Take 1 tablet (5 mg total) by mouth 2 (two) times daily. 11/17/14   Thayer Headings, MD  bicalutamide (CASODEX) 50 MG tablet Take 1 tablet by mouth daily.    Historical Provider, MD  ciprofloxacin (CIPRO) 500 MG tablet Take 1 tablet (500 mg total) by mouth 2 (two) times daily. 10/08/15   Orpah Greek, MD  cyanocobalamin (,VITAMIN B-12,) 1000 MCG/ML injection Inject 1,000 mcg into the muscle every 30 (thirty) days.  03/06/15   Historical Provider,  MD  furosemide (LASIX) 40 MG tablet Take 1 tablet by mouth 2 (two) times daily.  03/25/10   Historical Provider, MD  HYDROcodone-acetaminophen (NORCO/VICODIN) 5-325 MG tablet Take 1 tablet by mouth 2 (two) times daily as needed for moderate pain.  03/09/15   Historical Provider, MD  metFORMIN (GLUCOPHAGE-XR) 500 MG 24 hr tablet Take 500 mg by mouth daily.  02/02/15   Historical Provider, MD  metoprolol succinate (TOPROL XL)  25 MG 24 hr tablet Take 0.5 tablets (12.5 mg total) by mouth daily. 02/17/15   Thayer Headings, MD  polyethylene glycol [facility] / Floria Raveling) packet Take 17 g by mouth daily as needed (for constipation).    Historical Provider, MD  pravastatin (PRAVACHOL) 10 MG tablet Take 10 mg by mouth at bedtime.     Historical Provider, MD  Sennosides-Docusate Sodium (SENEXON-S PO) Take 1 tablet by mouth daily. Hold for loose stools    Historical Provider, MD   Family History History reviewed. No pertinent family history.  Social History Social History  Substance Use Topics  . Smoking status: Former Research scientist (life sciences)  . Smokeless tobacco: Never Used  . Alcohol use No   Allergies   Review of patient's allergies indicates no known allergies.  Review of Systems Review of Systems  Genitourinary: Positive for hematuria. Negative for dysuria.  All other systems reviewed and are negative.  Physical Exam Updated Vital Signs BP 120/63   Pulse (!) 56   Temp 98.3 F (36.8 C) (Oral)   Resp 22   SpO2 94%   Physical Exam  Constitutional: He is oriented to person, place, and time. He appears well-developed and well-nourished. No distress.  HENT:  Head: Normocephalic and atraumatic.  Right Ear: Hearing normal.  Left Ear: Hearing normal.  Nose: Nose normal.  Mouth/Throat: Oropharynx is clear and moist and mucous membranes are normal.  Eyes: Conjunctivae and EOM are normal. Pupils are equal, round, and reactive to light.  Neck: Normal range of motion. Neck supple.  Cardiovascular: Regular rhythm, S1 normal and S2 normal.  Exam reveals no gallop and no friction rub.   No murmur heard. Pulmonary/Chest: Effort normal and breath sounds normal. No respiratory distress. He exhibits no tenderness.  Abdominal: Soft. Normal appearance and bowel sounds are normal. There is no hepatosplenomegaly. There is tenderness. There is no rebound, no guarding, no tenderness at McBurney's point and negative Murphy's sign. No hernia.    Suprapubic tenderness to palpation.   Musculoskeletal: Normal range of motion.  Neurological: He is alert and oriented to person, place, and time. He has normal strength. No cranial nerve deficit or sensory deficit. Coordination normal. GCS eye subscore is 4. GCS verbal subscore is 5. GCS motor subscore is 6.  Skin: Skin is warm, dry and intact. No rash noted. No cyanosis.  Thickened horny growth on the dorsal aspect of the right hand.  Psychiatric: He has a normal mood and affect. His speech is normal and behavior is normal. Thought content normal.  Nursing note and vitals reviewed.  ED Treatments / Results  DIAGNOSTIC STUDIES: Oxygen Saturation is 92% on RA, low by my interpretation.   COORDINATION OF CARE: 10:03 PM-Discussed next steps with pt. Pt verbalized understanding and is agreeable with the plan.   Labs (all labs ordered are listed, but only abnormal results are displayed) Labs Reviewed  CBC - Abnormal; Notable for the following:       Result Value   WBC 11.3 (*)    RBC 3.68 (*)  Hemoglobin 11.0 (*)    HCT 33.5 (*)    All other components within normal limits  BASIC METABOLIC PANEL - Abnormal; Notable for the following:    Chloride 96 (*)    Glucose, Bld 123 (*)    BUN 23 (*)    Calcium 8.8 (*)    All other components within normal limits  URINALYSIS, ROUTINE W REFLEX MICROSCOPIC (NOT AT [facility]) - Abnormal; Notable for the following:    APPearance CLOUDY (*)    Hgb urine dipstick MODERATE (*)    Protein, ur 30 (*)    Leukocytes, UA LARGE (*)    All other components within normal limits  URINE MICROSCOPIC-ADD ON - Abnormal; Notable for the following:    Squamous Epithelial / LPF 0-5 (*)    Bacteria, UA MANY (*)    Crystals TRIPLE PHOSPHATE CRYSTALS (*)    All other components within normal limits  URINE CULTURE    EKG  EKG Interpretation None      Radiology No results found.  Procedures Procedures (including critical care time)  Medications  Ordered in ED Medications  ciprofloxacin (CIPRO) tablet 500 mg (not administered)    Initial Impression / Assessment and Plan / ED Course  I have reviewed the triage vital signs and the nursing notes.  Pertinent labs & imaging results that were available during my care of the patient were reviewed by me and considered in my medical decision making (see chart for details).  Clinical Course   Patient presents to the emergency department for evaluation of blood in his urine. Patient has a chronic indwelling Foley catheter. After the blood was noticed his urine again was noted to have decreased urine output. Bladder scan did show approximately a liter of urine in his bladder consistent with outlet obstruction. Foley catheter was replaced and he did drain. Urine is clearing now, no further bleeding. Vital signs are stable. Hemoglobin is stable. Creatinine is normal. Urinalysis does suggest infection. Patient seen proximally a month ago with similar symptoms, urine culture grew sensitive Enterobacter. Started on Cipro, as he was most recently sensitive to Cipro.  Final Clinical Impressions(s) / ED Diagnoses   Final diagnoses:  Urinary retention  Hemorrhagic cystitis    New Prescriptions New Prescriptions   CIPROFLOXACIN (CIPRO) 500 MG TABLET    Take 1 tablet (500 mg total) by mouth 2 (two) times daily.   I personally performed the services described in this documentation, which was scribed in my presence. The recorded information has been reviewed and is accurate.\    Orpah Greek, MD 10/08/15 0005

## 2015-10-07 NOTE — ED Notes (Signed)
Bladder Scan results: >999

## 2015-10-08 DIAGNOSIS — R531 Weakness: Secondary | ICD-10-CM | POA: Diagnosis not present

## 2015-10-08 DIAGNOSIS — R339 Retention of urine, unspecified: Secondary | ICD-10-CM | POA: Diagnosis not present

## 2015-10-08 MED ORDER — CIPROFLOXACIN HCL 500 MG PO TABS: 500.0000 mg | ORAL_TABLET | Freq: Two times a day (BID) | ORAL | 0 refills | Status: DC

## 2015-10-08 NOTE — ED Notes (Signed)
Patient waiting on PTAR. 

## 2015-10-10 LAB — URINE CULTURE: Culture: >=100000 — AB

## 2015-10-11 ENCOUNTER — Telehealth (HOSPITAL_BASED_OUTPATIENT_CLINIC_OR_DEPARTMENT_OTHER): Payer: Self-pay

## 2015-10-11 NOTE — Telephone Encounter (Signed)
Post ED Visit - Positive Culture Follow-up  Culture report reviewed by antimicrobial stewardship pharmacist:  []  Elenor Quinones, Pharm.D. []  Heide Guile, Pharm.D., BCPS [x]  Parks Neptune, Pharm.D. []  Alycia Rossetti, Pharm.D., BCPS []  [place], Pharm.D., BCPS, AAHIVP []  Legrand Como, Pharm.D., BCPS, AAHIVP []  Milus Glazier, Pharm.D. []  Stephens November, [place].D.  Positive urine culture Treated with Ciprofloxacin HCL, organism sensitive to the same and no further patient follow-up is required at this time.  Genia Del 10/11/2015, 9:24 AM

## 2015-10-13 DIAGNOSIS — R531 Weakness: Secondary | ICD-10-CM | POA: Diagnosis not present

## 2015-10-27 DIAGNOSIS — E538 Deficiency of other specified B group vitamins: Secondary | ICD-10-CM | POA: Diagnosis not present

## 2015-10-27 DIAGNOSIS — Z466 Encounter for fitting and adjustment of urinary device: Secondary | ICD-10-CM | POA: Diagnosis not present

## 2015-10-27 DIAGNOSIS — R338 Other retention of urine: Secondary | ICD-10-CM | POA: Diagnosis not present

## 2015-10-27 DIAGNOSIS — N312 Flaccid neuropathic bladder, not elsewhere classified: Secondary | ICD-10-CM | POA: Diagnosis not present

## 2015-10-27 DIAGNOSIS — E119 Type 2 diabetes mellitus without complications: Secondary | ICD-10-CM | POA: Diagnosis not present

## 2015-10-27 DIAGNOSIS — C61 Malignant neoplasm of prostate: Secondary | ICD-10-CM | POA: Diagnosis not present

## 2015-10-31 DIAGNOSIS — N312 Flaccid neuropathic bladder, not elsewhere classified: Secondary | ICD-10-CM | POA: Diagnosis not present

## 2015-10-31 DIAGNOSIS — R338 Other retention of urine: Secondary | ICD-10-CM | POA: Diagnosis not present

## 2015-10-31 DIAGNOSIS — E538 Deficiency of other specified B group vitamins: Secondary | ICD-10-CM | POA: Diagnosis not present

## 2015-10-31 DIAGNOSIS — Z466 Encounter for fitting and adjustment of urinary device: Secondary | ICD-10-CM | POA: Diagnosis not present

## 2015-10-31 DIAGNOSIS — C61 Malignant neoplasm of prostate: Secondary | ICD-10-CM | POA: Diagnosis not present

## 2015-10-31 DIAGNOSIS — E119 Type 2 diabetes mellitus without complications: Secondary | ICD-10-CM | POA: Diagnosis not present

## 2015-11-05 DIAGNOSIS — R531 Weakness: Secondary | ICD-10-CM | POA: Diagnosis not present

## 2015-11-08 DIAGNOSIS — N312 Flaccid neuropathic bladder, not elsewhere classified: Secondary | ICD-10-CM | POA: Diagnosis not present

## 2015-11-08 DIAGNOSIS — E119 Type 2 diabetes mellitus without complications: Secondary | ICD-10-CM | POA: Diagnosis not present

## 2015-11-08 DIAGNOSIS — E538 Deficiency of other specified B group vitamins: Secondary | ICD-10-CM | POA: Diagnosis not present

## 2015-11-08 DIAGNOSIS — Z466 Encounter for fitting and adjustment of urinary device: Secondary | ICD-10-CM | POA: Diagnosis not present

## 2015-11-08 DIAGNOSIS — R338 Other retention of urine: Secondary | ICD-10-CM | POA: Diagnosis not present

## 2015-11-08 DIAGNOSIS — C61 Malignant neoplasm of prostate: Secondary | ICD-10-CM | POA: Diagnosis not present

## 2015-11-10 DIAGNOSIS — E538 Deficiency of other specified B group vitamins: Secondary | ICD-10-CM | POA: Diagnosis not present

## 2015-11-10 DIAGNOSIS — C61 Malignant neoplasm of prostate: Secondary | ICD-10-CM | POA: Diagnosis not present

## 2015-11-10 DIAGNOSIS — R338 Other retention of urine: Secondary | ICD-10-CM | POA: Diagnosis not present

## 2015-11-10 DIAGNOSIS — Z466 Encounter for fitting and adjustment of urinary device: Secondary | ICD-10-CM | POA: Diagnosis not present

## 2015-11-10 DIAGNOSIS — N312 Flaccid neuropathic bladder, not elsewhere classified: Secondary | ICD-10-CM | POA: Diagnosis not present

## 2015-11-10 DIAGNOSIS — E119 Type 2 diabetes mellitus without complications: Secondary | ICD-10-CM | POA: Diagnosis not present

## 2015-11-12 DIAGNOSIS — Z466 Encounter for fitting and adjustment of urinary device: Secondary | ICD-10-CM | POA: Diagnosis not present

## 2015-11-12 DIAGNOSIS — R338 Other retention of urine: Secondary | ICD-10-CM | POA: Diagnosis not present

## 2015-11-12 DIAGNOSIS — E538 Deficiency of other specified B group vitamins: Secondary | ICD-10-CM | POA: Diagnosis not present

## 2015-11-12 DIAGNOSIS — E119 Type 2 diabetes mellitus without complications: Secondary | ICD-10-CM | POA: Diagnosis not present

## 2015-11-12 DIAGNOSIS — N312 Flaccid neuropathic bladder, not elsewhere classified: Secondary | ICD-10-CM | POA: Diagnosis not present

## 2015-11-12 DIAGNOSIS — C61 Malignant neoplasm of prostate: Secondary | ICD-10-CM | POA: Diagnosis not present

## 2015-11-24 DIAGNOSIS — C44629 Squamous cell carcinoma of skin of left upper limb, including shoulder: Secondary | ICD-10-CM | POA: Diagnosis not present

## 2015-11-25 DIAGNOSIS — E119 Type 2 diabetes mellitus without complications: Secondary | ICD-10-CM | POA: Diagnosis not present

## 2015-11-25 DIAGNOSIS — Z466 Encounter for fitting and adjustment of urinary device: Secondary | ICD-10-CM | POA: Diagnosis not present

## 2015-11-25 DIAGNOSIS — E538 Deficiency of other specified B group vitamins: Secondary | ICD-10-CM | POA: Diagnosis not present

## 2015-11-25 DIAGNOSIS — C61 Malignant neoplasm of prostate: Secondary | ICD-10-CM | POA: Diagnosis not present

## 2015-11-25 DIAGNOSIS — R338 Other retention of urine: Secondary | ICD-10-CM | POA: Diagnosis not present

## 2015-11-25 DIAGNOSIS — N312 Flaccid neuropathic bladder, not elsewhere classified: Secondary | ICD-10-CM | POA: Diagnosis not present

## 2015-11-28 ENCOUNTER — Emergency Department (HOSPITAL_COMMUNITY)
Admission: EM | Admit: 2015-11-28 | Discharge: 2015-11-28 | Disposition: A | Discharge status: Home / Self Care | Payer: Commercial Managed Care - HMO | Attending: Emergency Medicine | Admitting: Emergency Medicine

## 2015-11-28 ENCOUNTER — Encounter (HOSPITAL_COMMUNITY): Payer: Self-pay | Admitting: Emergency Medicine

## 2015-11-28 DIAGNOSIS — Z7982 Long term (current) use of aspirin: Secondary | ICD-10-CM | POA: Insufficient documentation

## 2015-11-28 DIAGNOSIS — E119 Type 2 diabetes mellitus without complications: Secondary | ICD-10-CM | POA: Diagnosis not present

## 2015-11-28 DIAGNOSIS — Z79899 Other long term (current) drug therapy: Secondary | ICD-10-CM | POA: Diagnosis not present

## 2015-11-28 DIAGNOSIS — T83091A Other mechanical complication of indwelling urethral catheter, initial encounter: Secondary | ICD-10-CM | POA: Diagnosis not present

## 2015-11-28 DIAGNOSIS — R339 Retention of urine, unspecified: Secondary | ICD-10-CM | POA: Diagnosis not present

## 2015-11-28 DIAGNOSIS — Z87891 Personal history of nicotine dependence: Secondary | ICD-10-CM | POA: Insufficient documentation

## 2015-11-28 DIAGNOSIS — R140 Abdominal distension (gaseous): Secondary | ICD-10-CM | POA: Diagnosis not present

## 2015-11-28 DIAGNOSIS — Z7984 Long term (current) use of oral hypoglycemic drugs: Secondary | ICD-10-CM | POA: Diagnosis not present

## 2015-11-28 DIAGNOSIS — Z8546 Personal history of malignant neoplasm of prostate: Secondary | ICD-10-CM | POA: Insufficient documentation

## 2015-11-28 DIAGNOSIS — K2970 Gastritis, unspecified, without bleeding: Secondary | ICD-10-CM | POA: Diagnosis not present

## 2015-11-28 LAB — URINALYSIS, ROUTINE W REFLEX MICROSCOPIC
BILIRUBIN URINE: NEGATIVE
Glucose, UA: NEGATIVE mg/dL
KETONES UR: NEGATIVE mg/dL
NITRITE: NEGATIVE
PROTEIN: NEGATIVE mg/dL
SPECIFIC GRAVITY, URINE: 1.012 (ref 1.005–1.030)
pH: 6.5 (ref 5.0–8.0)

## 2015-11-28 LAB — URINE MICROSCOPIC-ADD ON

## 2015-11-28 NOTE — ED Triage Notes (Signed)
Pt transported from [facility] with c/o urinary retention. Pt does have indwelling catheter in place. Pt unsure of last time he had drainage in bag. Pt has hx of foley blockages.

## 2015-11-28 NOTE — ED Provider Notes (Signed)
[facility] DEPT Provider Note   CSN: [id number] Arrival date & time: 11/28/15  0136  By signing my name below, I, Maud Deed. Royston Sinner, attest that this documentation has been prepared under the direction and in the presence of Ripley Fraise, MD.  Electronically Signed: Maud Deed. Royston Sinner, ED Scribe. 11/28/15. 2:26 AM.    History   Chief Complaint Chief Complaint  Patient presents with  . Urinary Retention   The history is provided by the patient. No language interpreter was used.  Abdominal Pain   This is a recurrent problem. The current episode started 3 to 5 hours ago. The problem occurs constantly. The problem has not changed since onset.The pain is located in the suprapubic region. The quality of the pain is pressure-like. The pain is moderate. Pertinent negatives include fever, diarrhea, nausea, vomiting, constipation and headaches. Nothing aggravates the symptoms. Nothing relieves the symptoms.    HPI Comments: Parker [name], brought in by EMS from [place] is a 82 y.o. male with a PMHx of DM and hyperlipidemia who presents to the Emergency Department complaining of constant, unchanged diffuse lower abdominal pain onset few hours prior to arrival. Pain is described as soreness and pressure-like. Pt has a chronic indwelling catheter in place and feels his catheter may be clogged. No aggravating or alleviating factors reported. No interventions given en route to department. No recent fever, chills, nausea, vomiting, chest pain, or shortness of breath. Per triage note, pt is unaware of the last time he noted drainage in urinary bag. Pt has a history of foley blockages.  PCP: Ileana Roup, MD    Past Medical History:  Diagnosis Date  . Aortic stenosis   . Bilateral leg edema   . Cardiac murmur   . Combined fat and carbohydrate induced hyperlipemia 10/28/2014  . Diabetes mellitus without complication ([place])   . Dyslipidemia   . Elevated brain natriuretic peptide (BNP) level  09/01/2014  . Hyperlipidemia 10/28/2014  . LBP (low back pain) 10/28/2014  . Neurogenic bladder    self cath at home  . Prostate cancer ([place])   . Vitamin B deficiency     Patient Active Problem List   Diagnosis Date Noted  . Aortic stenosis 11/17/2014  . Atrial fibrillation ([place]) 11/17/2014  . Other specified diabetes mellitus without complications 123XX123  . Diabetes type 2, controlled ([place]) 10/28/2014  . Diabetes ([place]) 10/28/2014  . Hyperlipidemia 10/28/2014  . Accumulation of fluid in tissues 10/28/2014  . Herpes zoster without complication 123XX123  . LBP (low back pain) 10/28/2014  . Combined fat and carbohydrate induced hyperlipemia 10/28/2014  . Type 2 diabetes mellitus without complications ([place]) 123XX123  . Localized edema 09/01/2014  . Elevated brain natriuretic peptide (BNP) level 09/01/2014  . Fluid overload 09/01/2014    Past Surgical History:  Procedure Laterality Date  . ORIF HUMERUS FRACTURE     Open reduction and internal fixation of transcondylar distal humerus fracture with intercondylar extension   . RADIAL HEAD ARTHROPLASTY         Home Medications    Prior to Admission medications   Medication Sig Start Date End Date Taking? Authorizing Provider  acetaminophen (TYLENOL) 500 MG tablet Take 500 mg by mouth 3 (three) times daily.    Historical Provider, MD  apixaban (ELIQUIS) 5 MG TABS tablet Take 1 tablet (5 mg total) by mouth 2 (two) times daily. 11/17/14   Thayer Headings, MD  bicalutamide (CASODEX) 50 MG tablet Take 1 tablet by mouth daily.  Historical Provider, MD  ciprofloxacin (CIPRO) 500 MG tablet Take 1 tablet (500 mg total) by mouth 2 (two) times daily. 10/08/15   Orpah Greek, MD  cyanocobalamin (,VITAMIN B-12,) 1000 MCG/ML injection Inject 1,000 mcg into the muscle every 30 (thirty) days.  03/06/15   Historical Provider, MD  furosemide (LASIX) 40 MG tablet Take 1 tablet by mouth 2 (two) times daily.  03/25/10   Historical  Provider, MD  HYDROcodone-acetaminophen (NORCO/VICODIN) 5-325 MG tablet Take 1 tablet by mouth 2 (two) times daily as needed for moderate pain.  03/09/15   Historical Provider, MD  metFORMIN (GLUCOPHAGE-XR) 500 MG 24 hr tablet Take 500 mg by mouth daily.  02/02/15   Historical Provider, MD  metoprolol succinate (TOPROL XL) 25 MG 24 hr tablet Take 0.5 tablets (12.5 mg total) by mouth daily. 02/17/15   Thayer Headings, MD  polyethylene glycol [facility] / Floria Raveling) packet Take 17 g by mouth daily as needed (for constipation).    Historical Provider, MD  pravastatin (PRAVACHOL) 10 MG tablet Take 10 mg by mouth at bedtime.     Historical Provider, MD  Sennosides-Docusate Sodium (SENEXON-S PO) Take 1 tablet by mouth daily. Hold for loose stools    Historical Provider, MD    Family History No family history on file.  Social History Social History  Substance Use Topics  . Smoking status: Former Research scientist (life sciences)  . Smokeless tobacco: Never Used  . Alcohol use No     Allergies   Patient has no known allergies.   Review of Systems Review of Systems  Constitutional: Negative for chills and fever.  Respiratory: Negative for shortness of breath.   Cardiovascular: Negative for chest pain.  Gastrointestinal: Positive for abdominal pain. Negative for constipation, diarrhea, nausea and vomiting.  Genitourinary: Positive for difficulty urinating.  Neurological: Negative for headaches.  All other systems reviewed and are negative.    Physical Exam Updated Vital Signs BP 169/77 (BP Location: Right Arm)   Pulse 65   Temp 98.2 F (36.8 C) (Oral)   Resp 16   SpO2 94%   Physical Exam  CONSTITUTIONAL: Elderly and frail  HEAD: Normocephalic/atraumatic ENMT: Mucous membranes moist NECK: supple no meningeal signs CV: [id number] noted, systolic murmur noted LUNGS: Lungs are clear to auscultation bilaterally, no apparent distress ABDOMEN: soft, nontender, no rebound or guarding, bowel sounds noted throughout  abdomen GU:no cva tenderness, catheter in place, no signs of trauma, no bleeding noted surrounding catheter, nurse tech chaperone present for exam NEURO: Pt is awake/alert/appropriate, moves all extremitiesx4. EXTREMITIES: pulses normal/equal, full ROM SKIN: dry skin noted    ED Treatments / Results   DIAGNOSTIC STUDIES: Oxygen Saturation is 94% on RA, adequate by my interpretation.    COORDINATION OF CARE: 2:08 AM- Will order urine culture and urinalysis. Will inset new foley catheter. Discussed treatment plan with pt at bedside and pt agreed to plan.     Labs (all labs ordered are listed, but only abnormal results are displayed) Labs Reviewed  URINALYSIS, ROUTINE W REFLEX MICROSCOPIC (NOT AT [facility]) - Abnormal; Notable for the following:       Result Value   Hgb urine dipstick SMALL (*)    Leukocytes, UA SMALL (*)    All other components within normal limits  URINE MICROSCOPIC-ADD ON - Abnormal; Notable for the following:    Squamous Epithelial / LPF 0-5 (*)    Bacteria, UA RARE (*)    Crystals CA OXALATE CRYSTALS (*)    All other components within  normal limits  URINE CULTURE    EKG  EKG Interpretation None       Radiology No results found.  Procedures Procedures (including critical care time)  Medications Ordered in ED Medications - No data to display   Initial Impression / Assessment and Plan / ED Course  I have reviewed the triage vital signs and the nursing notes.  Pertinent labs results that were available during my care of the patient were reviewed by me and considered in my medical decision making (see chart for details).  Clinical Course     Plan to remove and replace foley as it is obstructed and >1020ml urine in bladder per bladder scan per nursing 4:22 AM Pt with >149ml in foley after replacement He feels improved Apparently he has neurogenic bladder as cause of retention Will send home with foley Urine culture sent but no clear signs of  infection at this time Pt improved and appropriate for discharge BP 117/58   Pulse 61   Temp 98.2 F (36.8 C) (Oral)   Resp 22   SpO2 93%    Final Clinical Impressions(s) / ED Diagnoses   Final diagnoses:  Urinary retention    New Prescriptions New Prescriptions   No medications on file   I personally performed the services described in this documentation, which was scribed in my presence. The recorded information has been reviewed and is accurate.       Ripley Fraise, MD 11/28/15 [phone]

## 2015-11-28 NOTE — ED Notes (Signed)
CALLED [facility] FOR TRANSPORT °

## 2015-11-28 NOTE — ED Notes (Signed)
Report called to [facility]

## 2015-11-29 LAB — URINE CULTURE

## 2015-12-01 DIAGNOSIS — C61 Malignant neoplasm of prostate: Secondary | ICD-10-CM | POA: Diagnosis not present

## 2015-12-01 DIAGNOSIS — N312 Flaccid neuropathic bladder, not elsewhere classified: Secondary | ICD-10-CM | POA: Diagnosis not present

## 2015-12-01 DIAGNOSIS — E538 Deficiency of other specified B group vitamins: Secondary | ICD-10-CM | POA: Diagnosis not present

## 2015-12-01 DIAGNOSIS — R338 Other retention of urine: Secondary | ICD-10-CM | POA: Diagnosis not present

## 2015-12-01 DIAGNOSIS — E119 Type 2 diabetes mellitus without complications: Secondary | ICD-10-CM | POA: Diagnosis not present

## 2015-12-01 DIAGNOSIS — Z466 Encounter for fitting and adjustment of urinary device: Secondary | ICD-10-CM | POA: Diagnosis not present

## 2015-12-10 DIAGNOSIS — E119 Type 2 diabetes mellitus without complications: Secondary | ICD-10-CM | POA: Diagnosis not present

## 2015-12-10 DIAGNOSIS — R338 Other retention of urine: Secondary | ICD-10-CM | POA: Diagnosis not present

## 2015-12-10 DIAGNOSIS — Z466 Encounter for fitting and adjustment of urinary device: Secondary | ICD-10-CM | POA: Diagnosis not present

## 2015-12-10 DIAGNOSIS — C61 Malignant neoplasm of prostate: Secondary | ICD-10-CM | POA: Diagnosis not present

## 2015-12-10 DIAGNOSIS — E538 Deficiency of other specified B group vitamins: Secondary | ICD-10-CM | POA: Diagnosis not present

## 2015-12-10 DIAGNOSIS — N312 Flaccid neuropathic bladder, not elsewhere classified: Secondary | ICD-10-CM | POA: Diagnosis not present

## 2015-12-17 DIAGNOSIS — R338 Other retention of urine: Secondary | ICD-10-CM | POA: Diagnosis not present

## 2015-12-17 DIAGNOSIS — N312 Flaccid neuropathic bladder, not elsewhere classified: Secondary | ICD-10-CM | POA: Diagnosis not present

## 2015-12-17 DIAGNOSIS — E538 Deficiency of other specified B group vitamins: Secondary | ICD-10-CM | POA: Diagnosis not present

## 2015-12-17 DIAGNOSIS — Z466 Encounter for fitting and adjustment of urinary device: Secondary | ICD-10-CM | POA: Diagnosis not present

## 2015-12-17 DIAGNOSIS — E119 Type 2 diabetes mellitus without complications: Secondary | ICD-10-CM | POA: Diagnosis not present

## 2015-12-17 DIAGNOSIS — C61 Malignant neoplasm of prostate: Secondary | ICD-10-CM | POA: Diagnosis not present

## 2015-12-24 DIAGNOSIS — N312 Flaccid neuropathic bladder, not elsewhere classified: Secondary | ICD-10-CM | POA: Diagnosis not present

## 2015-12-24 DIAGNOSIS — C61 Malignant neoplasm of prostate: Secondary | ICD-10-CM | POA: Diagnosis not present

## 2015-12-24 DIAGNOSIS — E119 Type 2 diabetes mellitus without complications: Secondary | ICD-10-CM | POA: Diagnosis not present

## 2015-12-24 DIAGNOSIS — Z466 Encounter for fitting and adjustment of urinary device: Secondary | ICD-10-CM | POA: Diagnosis not present

## 2015-12-24 DIAGNOSIS — E538 Deficiency of other specified B group vitamins: Secondary | ICD-10-CM | POA: Diagnosis not present

## 2015-12-24 DIAGNOSIS — R338 Other retention of urine: Secondary | ICD-10-CM | POA: Diagnosis not present

## 2015-12-31 DIAGNOSIS — E538 Deficiency of other specified B group vitamins: Secondary | ICD-10-CM | POA: Diagnosis not present

## 2015-12-31 DIAGNOSIS — N312 Flaccid neuropathic bladder, not elsewhere classified: Secondary | ICD-10-CM | POA: Diagnosis not present

## 2015-12-31 DIAGNOSIS — R338 Other retention of urine: Secondary | ICD-10-CM | POA: Diagnosis not present

## 2015-12-31 DIAGNOSIS — E119 Type 2 diabetes mellitus without complications: Secondary | ICD-10-CM | POA: Diagnosis not present

## 2015-12-31 DIAGNOSIS — C61 Malignant neoplasm of prostate: Secondary | ICD-10-CM | POA: Diagnosis not present

## 2015-12-31 DIAGNOSIS — Z466 Encounter for fitting and adjustment of urinary device: Secondary | ICD-10-CM | POA: Diagnosis not present

## 2016-01-12 DIAGNOSIS — E538 Deficiency of other specified B group vitamins: Secondary | ICD-10-CM | POA: Diagnosis not present

## 2016-01-12 DIAGNOSIS — Z466 Encounter for fitting and adjustment of urinary device: Secondary | ICD-10-CM | POA: Diagnosis not present

## 2016-01-12 DIAGNOSIS — E119 Type 2 diabetes mellitus without complications: Secondary | ICD-10-CM | POA: Diagnosis not present

## 2016-01-12 DIAGNOSIS — R338 Other retention of urine: Secondary | ICD-10-CM | POA: Diagnosis not present

## 2016-01-12 DIAGNOSIS — C61 Malignant neoplasm of prostate: Secondary | ICD-10-CM | POA: Diagnosis not present

## 2016-01-12 DIAGNOSIS — N312 Flaccid neuropathic bladder, not elsewhere classified: Secondary | ICD-10-CM | POA: Diagnosis not present

## 2016-01-15 DIAGNOSIS — Z87440 Personal history of urinary (tract) infections: Secondary | ICD-10-CM | POA: Diagnosis not present

## 2016-01-15 DIAGNOSIS — R338 Other retention of urine: Secondary | ICD-10-CM | POA: Diagnosis not present

## 2016-01-15 DIAGNOSIS — E538 Deficiency of other specified B group vitamins: Secondary | ICD-10-CM | POA: Diagnosis not present

## 2016-01-15 DIAGNOSIS — C61 Malignant neoplasm of prostate: Secondary | ICD-10-CM | POA: Diagnosis not present

## 2016-01-15 DIAGNOSIS — N312 Flaccid neuropathic bladder, not elsewhere classified: Secondary | ICD-10-CM | POA: Diagnosis not present

## 2016-01-15 DIAGNOSIS — Z466 Encounter for fitting and adjustment of urinary device: Secondary | ICD-10-CM | POA: Diagnosis not present

## 2016-01-21 DIAGNOSIS — R531 Weakness: Secondary | ICD-10-CM | POA: Diagnosis not present

## 2016-02-08 DIAGNOSIS — R609 Edema, unspecified: Secondary | ICD-10-CM | POA: Diagnosis not present

## 2016-02-09 DIAGNOSIS — R05 Cough: Secondary | ICD-10-CM | POA: Diagnosis not present

## 2016-02-09 DIAGNOSIS — A05 Other bacterial foodborne intoxications, not elsewhere classified: Secondary | ICD-10-CM | POA: Diagnosis not present

## 2016-02-26 DIAGNOSIS — R531 Weakness: Secondary | ICD-10-CM | POA: Diagnosis not present

## 2016-03-03 ENCOUNTER — Encounter (HOSPITAL_COMMUNITY): Payer: Self-pay | Admitting: Emergency Medicine

## 2016-03-03 ENCOUNTER — Emergency Department (HOSPITAL_COMMUNITY): Payer: Medicare HMO

## 2016-03-03 ENCOUNTER — Inpatient Hospital Stay (HOSPITAL_COMMUNITY)
Admission: EM | Admit: 2016-03-03 | Discharge: 2016-03-09 | DRG: 698 | Disposition: A | Discharge status: Skilled Nursing Facility | Payer: Medicare HMO | Attending: Internal Medicine | Admitting: Internal Medicine

## 2016-03-03 DIAGNOSIS — R059 Cough, unspecified: Secondary | ICD-10-CM

## 2016-03-03 DIAGNOSIS — Z66 Do not resuscitate: Secondary | ICD-10-CM | POA: Diagnosis present

## 2016-03-03 DIAGNOSIS — R2689 Other abnormalities of gait and mobility: Secondary | ICD-10-CM | POA: Diagnosis not present

## 2016-03-03 DIAGNOSIS — N390 Urinary tract infection, site not specified: Secondary | ICD-10-CM | POA: Diagnosis present

## 2016-03-03 DIAGNOSIS — J181 Lobar pneumonia, unspecified organism: Secondary | ICD-10-CM | POA: Diagnosis present

## 2016-03-03 DIAGNOSIS — Z8546 Personal history of malignant neoplasm of prostate: Secondary | ICD-10-CM

## 2016-03-03 DIAGNOSIS — R05 Cough: Secondary | ICD-10-CM | POA: Diagnosis not present

## 2016-03-03 DIAGNOSIS — Z7984 Long term (current) use of oral hypoglycemic drugs: Secondary | ICD-10-CM | POA: Diagnosis not present

## 2016-03-03 DIAGNOSIS — R195 Other fecal abnormalities: Secondary | ICD-10-CM | POA: Diagnosis present

## 2016-03-03 DIAGNOSIS — E876 Hypokalemia: Secondary | ICD-10-CM | POA: Diagnosis present

## 2016-03-03 DIAGNOSIS — G9340 Encephalopathy, unspecified: Secondary | ICD-10-CM | POA: Diagnosis not present

## 2016-03-03 DIAGNOSIS — R410 Disorientation, unspecified: Secondary | ICD-10-CM | POA: Diagnosis present

## 2016-03-03 DIAGNOSIS — Z79899 Other long term (current) drug therapy: Secondary | ICD-10-CM

## 2016-03-03 DIAGNOSIS — N319 Neuromuscular dysfunction of bladder, unspecified: Secondary | ICD-10-CM | POA: Diagnosis not present

## 2016-03-03 DIAGNOSIS — T83511A Infection and inflammatory reaction due to indwelling urethral catheter, initial encounter: Principal | ICD-10-CM | POA: Diagnosis present

## 2016-03-03 DIAGNOSIS — J189 Pneumonia, unspecified organism: Secondary | ICD-10-CM | POA: Diagnosis not present

## 2016-03-03 DIAGNOSIS — Y846 Urinary catheterization as the cause of abnormal reaction of the patient, or of later complication, without mention of misadventure at the time of the procedure: Secondary | ICD-10-CM | POA: Diagnosis present

## 2016-03-03 DIAGNOSIS — E785 Hyperlipidemia, unspecified: Secondary | ICD-10-CM | POA: Diagnosis present

## 2016-03-03 DIAGNOSIS — Z7901 Long term (current) use of anticoagulants: Secondary | ICD-10-CM | POA: Diagnosis not present

## 2016-03-03 DIAGNOSIS — E119 Type 2 diabetes mellitus without complications: Secondary | ICD-10-CM | POA: Diagnosis present

## 2016-03-03 DIAGNOSIS — H1033 Unspecified acute conjunctivitis, bilateral: Secondary | ICD-10-CM | POA: Diagnosis not present

## 2016-03-03 DIAGNOSIS — R0602 Shortness of breath: Secondary | ICD-10-CM | POA: Diagnosis not present

## 2016-03-03 DIAGNOSIS — I482 Chronic atrial fibrillation: Secondary | ICD-10-CM | POA: Diagnosis present

## 2016-03-03 DIAGNOSIS — D649 Anemia, unspecified: Secondary | ICD-10-CM | POA: Diagnosis not present

## 2016-03-03 DIAGNOSIS — K5900 Constipation, unspecified: Secondary | ICD-10-CM | POA: Diagnosis present

## 2016-03-03 DIAGNOSIS — D62 Acute posthemorrhagic anemia: Secondary | ICD-10-CM | POA: Diagnosis not present

## 2016-03-03 DIAGNOSIS — D631 Anemia in chronic kidney disease: Secondary | ICD-10-CM | POA: Diagnosis not present

## 2016-03-03 DIAGNOSIS — I4891 Unspecified atrial fibrillation: Secondary | ICD-10-CM | POA: Diagnosis present

## 2016-03-03 DIAGNOSIS — B965 Pseudomonas (aeruginosa) (mallei) (pseudomallei) as the cause of diseases classified elsewhere: Secondary | ICD-10-CM | POA: Diagnosis present

## 2016-03-03 DIAGNOSIS — I350 Nonrheumatic aortic (valve) stenosis: Secondary | ICD-10-CM | POA: Diagnosis not present

## 2016-03-03 DIAGNOSIS — K922 Gastrointestinal hemorrhage, unspecified: Secondary | ICD-10-CM | POA: Diagnosis present

## 2016-03-03 DIAGNOSIS — R531 Weakness: Secondary | ICD-10-CM | POA: Diagnosis not present

## 2016-03-03 DIAGNOSIS — C61 Malignant neoplasm of prostate: Secondary | ICD-10-CM | POA: Diagnosis not present

## 2016-03-03 DIAGNOSIS — Z87891 Personal history of nicotine dependence: Secondary | ICD-10-CM

## 2016-03-03 DIAGNOSIS — Z6831 Body mass index (BMI) 31.0-31.9, adult: Secondary | ICD-10-CM

## 2016-03-03 DIAGNOSIS — K219 Gastro-esophageal reflux disease without esophagitis: Secondary | ICD-10-CM | POA: Diagnosis not present

## 2016-03-03 DIAGNOSIS — I6789 Other cerebrovascular disease: Secondary | ICD-10-CM | POA: Diagnosis not present

## 2016-03-03 DIAGNOSIS — E118 Type 2 diabetes mellitus with unspecified complications: Secondary | ICD-10-CM | POA: Diagnosis not present

## 2016-03-03 DIAGNOSIS — R402411 Glasgow coma scale score 13-15, in the field [EMT or ambulance]: Secondary | ICD-10-CM | POA: Diagnosis not present

## 2016-03-03 DIAGNOSIS — G92 Toxic encephalopathy: Secondary | ICD-10-CM | POA: Diagnosis present

## 2016-03-03 DIAGNOSIS — F4489 Other dissociative and conversion disorders: Secondary | ICD-10-CM | POA: Diagnosis not present

## 2016-03-03 DIAGNOSIS — K921 Melena: Secondary | ICD-10-CM | POA: Diagnosis not present

## 2016-03-03 DIAGNOSIS — D500 Iron deficiency anemia secondary to blood loss (chronic): Secondary | ICD-10-CM | POA: Diagnosis not present

## 2016-03-03 DIAGNOSIS — R4182 Altered mental status, unspecified: Secondary | ICD-10-CM | POA: Diagnosis not present

## 2016-03-03 DIAGNOSIS — R278 Other lack of coordination: Secondary | ICD-10-CM | POA: Diagnosis not present

## 2016-03-03 HISTORY — DX: Unspecified atrial fibrillation: I48.91

## 2016-03-03 NOTE — ED Provider Notes (Addendum)
[facility] DEPT Provider Note   CSN: [id number] Arrival date & time: 03/03/16  2228   By signing my name below, I, Delton Prairie, attest that this documentation has been prepared under the direction and in the presence of Elexia Friedt, MD  Electronically Signed: Delton Prairie, ED Scribe. 03/03/16. 12:35 AM.   History   Chief Complaint Chief Complaint  Patient presents with  . Altered Mental Status   The history is provided by a relative and the patient. No language interpreter was used.  Altered Mental Status   This is a new problem. The current episode started 6 to 12 hours ago. The problem has been resolved. Pertinent negatives include no confusion and no weakness. Risk factors: elderly. His past medical history does not include diabetes or seizures.   HPI Comments:  Parker [name] is a 83 y.o. male, with a hx of A-fib, hyperlipidemia and DM, who presents to the Emergency Department after EMS was called to nursing facility for altered mental status onset today. Relative states the pt was woken up by nursing staff and notes the pt has been acting normal since 6:30 PM. Pt states he is just tired. He has a foley catheter in place with pus in the bag. No alleviating factors noted. Pt denies any complaints at this time.   Past Medical History:  Diagnosis Date  . Aortic stenosis   . Aortic stenosis   . Atrial fibrillation ([place])   . Bilateral leg edema   . Cardiac murmur   . Combined fat and carbohydrate induced hyperlipemia 10/28/2014  . Diabetes mellitus without complication ([place])   . Dyslipidemia   . Elevated brain natriuretic peptide (BNP) level 09/01/2014  . Hyperlipidemia 10/28/2014  . LBP (low back pain) 10/28/2014  . Neurogenic bladder    self cath at home  . Prostate cancer ([place])   . Vitamin B deficiency     Patient Active Problem List   Diagnosis Date Noted  . Aortic stenosis 11/17/2014  . Atrial fibrillation ([place]) 11/17/2014  . Other specified diabetes mellitus  without complications 123XX123  . Diabetes type 2, controlled ([place]) 10/28/2014  . Diabetes ([place]) 10/28/2014  . Hyperlipidemia 10/28/2014  . Accumulation of fluid in tissues 10/28/2014  . Herpes zoster without complication 123XX123  . LBP (low back pain) 10/28/2014  . Combined fat and carbohydrate induced hyperlipemia 10/28/2014  . Type 2 diabetes mellitus without complications ([place]) 123XX123  . Localized edema 09/01/2014  . Elevated brain natriuretic peptide (BNP) level 09/01/2014  . Fluid overload 09/01/2014    Past Surgical History:  Procedure Laterality Date  . ORIF HUMERUS FRACTURE     Open reduction and internal fixation of transcondylar distal humerus fracture with intercondylar extension   . RADIAL HEAD ARTHROPLASTY         Home Medications    Prior to Admission medications   Medication Sig Start Date End Date Taking? Authorizing Provider  acetaminophen (TYLENOL) 500 MG tablet Take 500 mg by mouth every 8 (eight) hours as needed for mild pain.     Historical Provider, MD  apixaban (ELIQUIS) 5 MG TABS tablet Take 1 tablet (5 mg total) by mouth 2 (two) times daily. 11/17/14   Thayer Headings, MD  aspirin EC 81 MG tablet Take 81 mg by mouth daily.    Historical Provider, MD  cyanocobalamin (,VITAMIN B-12,) 1000 MCG/ML injection Inject 1,000 mcg into the muscle every 30 (thirty) days.  03/06/15   Historical Provider, MD  doxazosin (CARDURA) 4 MG tablet Take  2 mg by mouth at bedtime.    Historical Provider, MD  furosemide (LASIX) 40 MG tablet Take 1 tablet by mouth 2 (two) times daily.  03/25/10   Historical Provider, MD  HYDROcodone-acetaminophen (NORCO/VICODIN) 5-325 MG tablet Take 1 tablet by mouth 2 (two) times daily as needed for moderate pain.  03/09/15   Historical Provider, MD  metFORMIN (GLUCOPHAGE-XR) 500 MG 24 hr tablet Take 500 mg by mouth daily.  02/02/15   Historical Provider, MD  metoprolol succinate (TOPROL XL) 25 MG 24 hr tablet Take 0.5 tablets (12.5 mg total)  by mouth daily. 02/17/15   Thayer Headings, MD  polyethylene glycol [facility] / Floria Raveling) packet Take 17 g by mouth daily as needed (for constipation).    Historical Provider, MD  pravastatin (PRAVACHOL) 10 MG tablet Take 10 mg by mouth at bedtime.     Historical Provider, MD  Sennosides-Docusate Sodium (SENEXON-S PO) Take 1 tablet by mouth daily as needed (constipation). Hold for loose stools     Historical Provider, MD    Family History History reviewed. No pertinent family history.  Social History Social History  Substance Use Topics  . Smoking status: Former Research scientist (life sciences)  . Smokeless tobacco: Never Used  . Alcohol use No     Allergies   Patient has no known allergies.   Review of Systems Review of Systems  Constitutional: Negative for fever.  Respiratory: Negative for cough and shortness of breath.   Gastrointestinal: Negative for abdominal pain and anal bleeding.  Neurological: Negative for weakness.  Psychiatric/Behavioral: Negative for confusion.  All other systems reviewed and are negative.  Physical Exam Updated Vital Signs BP 130/71 (BP Location: Left Arm)   Pulse 65   Temp 97.4 F (36.3 C) (Oral)   Resp 20   SpO2 94%   Physical Exam  Constitutional: He appears well-developed and well-nourished. No distress.  HENT:  Head: Normocephalic and atraumatic.  Mouth/Throat: Oropharynx is clear and moist. No oropharyngeal exudate.  Moist mucous membranes. Drainage noted from left eye with crusting   Eyes: EOM are normal. Pupils are equal, round, and reactive to light. Right eye exhibits discharge. Left eye exhibits discharge.  Neck: Normal range of motion. Neck supple. No JVD present.  Trachea midline No bruit  Cardiovascular: Normal rate, regular rhythm and intact distal pulses.  Exam reveals no friction rub.   Murmur heard.  Systolic murmur is present with a grade of 2/6  Long standing systolic ejection murmur 2/6  Pulmonary/Chest: Effort normal and breath sounds  normal. No stridor. No respiratory distress.  Abdominal: Soft. Bowel sounds are normal. He exhibits distension. He exhibits no mass. There is no tenderness. There is no guarding.  Gassy   Genitourinary: Rectal exam shows guaiac positive stool.  Genitourinary Comments: Pus present in foley catheter bag.   Musculoskeletal: He exhibits edema.  Lymphadenopathy:    He has no cervical adenopathy.  Neurological: He is alert. He has normal reflexes.  Skin: Skin is warm and dry. Capillary refill takes less than 2 seconds.  Psychiatric: He has a normal mood and affect. His behavior is normal.  Nursing note and vitals reviewed.   ED Treatments / Results   Vitals:   03/03/16 2233  BP: 130/71  Pulse: 65  Resp: 20  Temp: 97.4 F (36.3 C)    DIAGNOSTIC STUDIES: Oxygen Saturation is 94% on RA, normal by my interpretation.    Results for orders placed or performed during the hospital encounter of 03/03/16  CBC with Differential/Platelet  Result Value Ref Range   WBC 11.5 (H) 4.0 - 10.5 K/uL   RBC 2.70 (L) 4.22 - 5.81 MIL/uL   Hemoglobin 6.8 (LL) 13.0 - 17.0 g/dL   HCT 22.2 (L) 39.0 - 52.0 %   MCV 82.2 78.0 - 100.0 fL   MCH 25.2 (L) 26.0 - 34.0 pg   MCHC 30.6 30.0 - 36.0 g/dL   RDW 16.7 (H) 11.5 - 15.5 %   Platelets 324 150 - 400 K/uL   Neutrophils Relative % 68 %   Neutro Abs 7.8 (H) 1.7 - 7.7 K/uL   Lymphocytes Relative 17 %   Lymphs Abs 1.9 0.7 - 4.0 K/uL   Monocytes Relative 13 %   Monocytes Absolute 1.5 (H) 0.1 - 1.0 K/uL   Eosinophils Relative 2 %   Eosinophils Absolute 0.2 0.0 - 0.7 K/uL   Basophils Relative 0 %   Basophils Absolute 0.1 0.0 - 0.1 K/uL  Urinalysis, Routine w reflex microscopic  Result Value Ref Range   Color, Urine YELLOW YELLOW   APPearance TURBID (A) CLEAR   Specific Gravity, Urine 1.010 1.005 - 1.030   pH 5.0 5.0 - 8.0   Glucose, UA NEGATIVE NEGATIVE mg/dL   Hgb urine dipstick MODERATE (A) NEGATIVE   Bilirubin Urine NEGATIVE NEGATIVE   Ketones, ur  NEGATIVE NEGATIVE mg/dL   Protein, ur 100 (A) NEGATIVE mg/dL   Nitrite NEGATIVE NEGATIVE   Leukocytes, UA LARGE (A) NEGATIVE   RBC / HPF TOO NUMEROUS TO COUNT 0 - 5 RBC/hpf   WBC, UA TOO NUMEROUS TO COUNT 0 - 5 WBC/hpf   Bacteria, UA MANY (A) NONE SEEN   Squamous Epithelial / LPF NONE SEEN NONE SEEN   WBC Clumps PRESENT   I-Stat Chem 8, ED  Result Value Ref Range   Sodium 136 135 - 145 mmol/L   Potassium 3.5 3.5 - 5.1 mmol/L   Chloride 92 (L) 101 - 111 mmol/L   BUN 34 (H) 6 - 20 mg/dL   Creatinine, Ser 1.30 (H) 0.61 - 1.24 mg/dL   Glucose, Bld 111 (H) 65 - 99 mg/dL   Calcium, Ion 1.05 (L) 1.15 - 1.40 mmol/L   TCO2 30 0 - 100 mmol/L   Hemoglobin 7.5 (L) 13.0 - 17.0 g/dL   HCT 22.0 (L) 39.0 - 52.0 %  POC occult blood, ED Provider will collect  Result Value Ref Range   Fecal Occult Bld POSITIVE (A) NEGATIVE   Dg Chest 2 View  Result Date: 03/03/2016 CLINICAL DATA:  Shortness of breath and altered mental status. EXAM: CHEST  2 VIEW COMPARISON:  None. FINDINGS: The heart is enlarged with aortic tortuosity and atherosclerosis. Lungs appear hyperinflated. Ill-defined opacity posteriorly on the lateral view may localize to the medial right lower lobe. No pulmonary edema or pleural fluid. No pneumothorax. Compression fractures in the mid and lower thoracic spine, lower compression fracture is chronic based on lumbar spine radiographs 03/11/2015. The bones are under mineralized. IMPRESSION: 1. Ill-defined retrocardiac opacity likely in the medial right lower lobe may be atelectasis or pneumonia. 2. Cardiomegaly with aortic tortuosity and atherosclerosis. 3. Compression fractures in the thoracic spine, at least 1 of which is chronic. Electronically Signed   By: [name] M.D.   On: 03/03/2016 23:36    COORDINATION OF CARE:  12:12 AM Discussed treatment plan with pt at bedside and pt agreed to plan.  Radiology No results found.  Procedures Procedures (including critical care  time)  Medications Ordered in ED  Medications  0.9 %  sodium chloride infusion (not administered)  erythromycin ophthalmic ointment (not administered)  cefTRIAXone (ROCEPHIN) 2 g in dextrose 5 % 50 mL IVPB (not administered)  pantoprazole (PROTONIX) 80 mg in sodium chloride 0.9 % 100 mL IVPB (not administered)  pantoprazole (PROTONIX) 80 mg in sodium chloride 0.9 % 250 mL (0.32 mg/mL) infusion (not administered)  pantoprazole (PROTONIX) injection 40 mg (not administered)     Initial Impression / Assessment and Plan / ED Course  I have reviewed the triage vital signs and the nursing notes. Pertinent labs & imaging results that were available during my care of the patient were reviewed by me and considered in my medical decision making (see chart for details).     This is a 83 y.o. -year-old male presents with multiple medical issues. The patient appears reasonably stabilized for admission considering the current resources, flow, and capabilities available in the ED at this time, and I doubt any other [facility] requiring further screening and/or treatment in the ED prior to admission.    I personally performed the services described in this documentation, which was scribed in my presence. The recorded information has been reviewed and is accurate.      Veatrice Kells, MD 03/04/16 0056    Lonald Troiani, MD 03/04/16 [phone]

## 2016-03-03 NOTE — ED Notes (Signed)
Bed: [id number] Expected date:  Expected time:  Means of arrival:  Comments: 83 yo M  Altered mental status

## 2016-03-03 NOTE — ED Triage Notes (Signed)
Pt is a resident at [facility] at [facility]   EMS was called out tonight for change in mental status  Pt is usually alert and oriented to person, place and day but has been confused today  Pt has a foley cath in place that is draining cloudy urine  Pt also has pitting edema in his lower extremities  Right lower leg is weeping

## 2016-03-04 ENCOUNTER — Encounter (HOSPITAL_COMMUNITY): Payer: Self-pay | Admitting: Internal Medicine

## 2016-03-04 ENCOUNTER — Emergency Department (HOSPITAL_COMMUNITY): Payer: Medicare HMO

## 2016-03-04 ENCOUNTER — Inpatient Hospital Stay (HOSPITAL_COMMUNITY): Payer: Medicare HMO

## 2016-03-04 DIAGNOSIS — E119 Type 2 diabetes mellitus without complications: Secondary | ICD-10-CM | POA: Diagnosis present

## 2016-03-04 DIAGNOSIS — I350 Nonrheumatic aortic (valve) stenosis: Secondary | ICD-10-CM | POA: Diagnosis present

## 2016-03-04 DIAGNOSIS — K922 Gastrointestinal hemorrhage, unspecified: Secondary | ICD-10-CM | POA: Diagnosis present

## 2016-03-04 DIAGNOSIS — Z7984 Long term (current) use of oral hypoglycemic drugs: Secondary | ICD-10-CM | POA: Diagnosis not present

## 2016-03-04 DIAGNOSIS — N390 Urinary tract infection, site not specified: Secondary | ICD-10-CM | POA: Diagnosis present

## 2016-03-04 DIAGNOSIS — D649 Anemia, unspecified: Secondary | ICD-10-CM | POA: Diagnosis not present

## 2016-03-04 DIAGNOSIS — Y846 Urinary catheterization as the cause of abnormal reaction of the patient, or of later complication, without mention of misadventure at the time of the procedure: Secondary | ICD-10-CM | POA: Diagnosis present

## 2016-03-04 DIAGNOSIS — G92 Toxic encephalopathy: Secondary | ICD-10-CM | POA: Diagnosis present

## 2016-03-04 DIAGNOSIS — Z87891 Personal history of nicotine dependence: Secondary | ICD-10-CM | POA: Diagnosis not present

## 2016-03-04 DIAGNOSIS — N319 Neuromuscular dysfunction of bladder, unspecified: Secondary | ICD-10-CM | POA: Diagnosis present

## 2016-03-04 DIAGNOSIS — J181 Lobar pneumonia, unspecified organism: Secondary | ICD-10-CM | POA: Diagnosis present

## 2016-03-04 DIAGNOSIS — Z66 Do not resuscitate: Secondary | ICD-10-CM | POA: Diagnosis present

## 2016-03-04 DIAGNOSIS — G9340 Encephalopathy, unspecified: Secondary | ICD-10-CM | POA: Diagnosis not present

## 2016-03-04 DIAGNOSIS — E785 Hyperlipidemia, unspecified: Secondary | ICD-10-CM | POA: Diagnosis present

## 2016-03-04 DIAGNOSIS — K5900 Constipation, unspecified: Secondary | ICD-10-CM | POA: Diagnosis present

## 2016-03-04 DIAGNOSIS — B965 Pseudomonas (aeruginosa) (mallei) (pseudomallei) as the cause of diseases classified elsewhere: Secondary | ICD-10-CM | POA: Diagnosis present

## 2016-03-04 DIAGNOSIS — E876 Hypokalemia: Secondary | ICD-10-CM | POA: Diagnosis present

## 2016-03-04 DIAGNOSIS — R410 Disorientation, unspecified: Secondary | ICD-10-CM | POA: Diagnosis present

## 2016-03-04 DIAGNOSIS — Z79899 Other long term (current) drug therapy: Secondary | ICD-10-CM | POA: Diagnosis not present

## 2016-03-04 DIAGNOSIS — Z8546 Personal history of malignant neoplasm of prostate: Secondary | ICD-10-CM | POA: Diagnosis not present

## 2016-03-04 DIAGNOSIS — E118 Type 2 diabetes mellitus with unspecified complications: Secondary | ICD-10-CM | POA: Diagnosis not present

## 2016-03-04 DIAGNOSIS — R195 Other fecal abnormalities: Secondary | ICD-10-CM | POA: Diagnosis present

## 2016-03-04 DIAGNOSIS — T83511A Infection and inflammatory reaction due to indwelling urethral catheter, initial encounter: Secondary | ICD-10-CM | POA: Diagnosis present

## 2016-03-04 DIAGNOSIS — Z7901 Long term (current) use of anticoagulants: Secondary | ICD-10-CM | POA: Diagnosis not present

## 2016-03-04 DIAGNOSIS — D62 Acute posthemorrhagic anemia: Secondary | ICD-10-CM | POA: Diagnosis present

## 2016-03-04 DIAGNOSIS — Z6831 Body mass index (BMI) 31.0-31.9, adult: Secondary | ICD-10-CM | POA: Diagnosis not present

## 2016-03-04 DIAGNOSIS — I482 Chronic atrial fibrillation: Secondary | ICD-10-CM | POA: Diagnosis present

## 2016-03-04 LAB — CBC WITH DIFFERENTIAL/PLATELET
BASOS ABS: 0.1 10*3/uL (ref 0.0–0.1)
BASOS PCT: 0 %
EOS ABS: 0.2 10*3/uL (ref 0.0–0.7)
EOS PCT: 2 %
HCT: 22.2 % — ABNORMAL LOW (ref 39.0–52.0)
Hemoglobin: 6.8 g/dL — CL (ref 13.0–17.0)
LYMPHS PCT: 17 %
Lymphs Abs: 1.9 10*3/uL (ref 0.7–4.0)
MCH: 25.2 pg — ABNORMAL LOW (ref 26.0–34.0)
MCHC: 30.6 g/dL (ref 30.0–36.0)
MCV: 82.2 fL (ref 78.0–100.0)
Monocytes Absolute: 1.5 10*3/uL — ABNORMAL HIGH (ref 0.1–1.0)
Monocytes Relative: 13 %
Neutro Abs: 7.8 10*3/uL — ABNORMAL HIGH (ref 1.7–7.7)
Neutrophils Relative %: 68 %
PLATELETS: 324 10*3/uL (ref 150–400)
RBC: 2.7 MIL/uL — AB (ref 4.22–5.81)
RDW: 16.7 % — ABNORMAL HIGH (ref 11.5–15.5)
WBC: 11.5 10*3/uL — AB (ref 4.0–10.5)

## 2016-03-04 LAB — BASIC METABOLIC PANEL
Anion gap: 8 (ref 5–15)
BUN: 34 mg/dL — AB (ref 6–20)
CHLORIDE: 97 mmol/L — AB (ref 101–111)
CO2: 31 mmol/L (ref 22–32)
Calcium: 8.6 mg/dL — ABNORMAL LOW (ref 8.9–10.3)
Creatinine, Ser: 1.16 mg/dL (ref 0.61–1.24)
GFR calc Af Amer: >60 mL/min (ref >60)
GFR calc non Af Amer: 53 mL/min — ABNORMAL LOW (ref >60)
Glucose, Bld: 102 mg/dL — ABNORMAL HIGH (ref 65–99)
POTASSIUM: 3.4 mmol/L — AB (ref 3.5–5.1)
SODIUM: 136 mmol/L (ref 135–145)

## 2016-03-04 LAB — URINALYSIS, ROUTINE W REFLEX MICROSCOPIC
Bilirubin Urine: NEGATIVE
Glucose, UA: NEGATIVE mg/dL
Ketones, ur: NEGATIVE mg/dL
Nitrite: NEGATIVE
Protein, ur: 100 mg/dL — AB
SPECIFIC GRAVITY, URINE: 1.01 (ref 1.005–1.030)
SQUAMOUS EPITHELIAL / LPF: NONE SEEN
pH: 5 (ref 5.0–8.0)

## 2016-03-04 LAB — ABO/RH: ABO/RH(D): A POS

## 2016-03-04 LAB — PREPARE RBC (CROSSMATCH)

## 2016-03-04 LAB — I-STAT CHEM 8, ED
BUN: 34 mg/dL — AB (ref 6–20)
CALCIUM ION: 1.05 mmol/L — AB (ref 1.15–1.40)
CREATININE: 1.3 mg/dL — AB (ref 0.61–1.24)
Chloride: 92 mmol/L — ABNORMAL LOW (ref 101–111)
GLUCOSE: 111 mg/dL — AB (ref 65–99)
HCT: 22 % — ABNORMAL LOW (ref 39.0–52.0)
HEMOGLOBIN: 7.5 g/dL — AB (ref 13.0–17.0)
Potassium: 3.5 mmol/L (ref 3.5–5.1)
Sodium: 136 mmol/L (ref 135–145)
TCO2: 30 mmol/L (ref 0–100)

## 2016-03-04 LAB — GLUCOSE, CAPILLARY
GLUCOSE-CAPILLARY: 102 mg/dL — AB (ref 65–99)
GLUCOSE-CAPILLARY: 76 mg/dL (ref 65–99)
Glucose-Capillary: 102 mg/dL — ABNORMAL HIGH (ref 65–99)
Glucose-Capillary: 115 mg/dL — ABNORMAL HIGH (ref 65–99)
Glucose-Capillary: 163 mg/dL — ABNORMAL HIGH (ref 65–99)
Glucose-Capillary: 91 mg/dL (ref 65–99)

## 2016-03-04 LAB — POC OCCULT BLOOD, ED: FECAL OCCULT BLD: POSITIVE — AB

## 2016-03-04 LAB — CBC
HEMATOCRIT: 23.1 % — AB (ref 39.0–52.0)
Hemoglobin: 7.4 g/dL — ABNORMAL LOW (ref 13.0–17.0)
MCH: 26.5 pg (ref 26.0–34.0)
MCHC: 32 g/dL (ref 30.0–36.0)
MCV: 82.8 fL (ref 78.0–100.0)
Platelets: 284 10*3/uL (ref 150–400)
RBC: 2.79 MIL/uL — ABNORMAL LOW (ref 4.22–5.81)
RDW: 16.3 % — AB (ref 11.5–15.5)
WBC: 9.4 10*3/uL (ref 4.0–10.5)

## 2016-03-04 LAB — MRSA PCR SCREENING: MRSA BY PCR: NEGATIVE

## 2016-03-04 MED ORDER — ERYTHROMYCIN 5 MG/GM OP OINT
TOPICAL_OINTMENT | Freq: Once | OPHTHALMIC | Status: AC
  Administered 2016-03-04: 02:00:00 via OPHTHALMIC
  Filled 2016-03-04: qty 3.5

## 2016-03-04 MED ORDER — PANTOPRAZOLE SODIUM 40 MG IV SOLR: 40.0000 mg | Freq: Two times a day (BID) | INTRAVENOUS | Status: DC

## 2016-03-04 MED ORDER — ACETAMINOPHEN 650 MG RE SUPP
650.0000 mg | Freq: Four times a day (QID) | RECTAL | Status: DC | PRN
Start: 2016-03-04 — End: 2016-03-09

## 2016-03-04 MED ORDER — POLYETHYLENE GLYCOL 3350 17 G PO PACK
17.0000 g | PACK | Freq: Every day | ORAL | Status: DC
  Administered 2016-03-04 – 2016-03-05 (×2): 17 g via ORAL
  Filled 2016-03-04: qty 1

## 2016-03-04 MED ORDER — INSULIN ASPART 100 UNIT/ML ~~LOC~~ SOLN
0.0000 [IU] | SUBCUTANEOUS | Status: DC
  Administered 2016-03-04: 2 [IU] via SUBCUTANEOUS
  Administered 2016-03-06 – 2016-03-08 (×3): 1 [IU] via SUBCUTANEOUS
  Administered 2016-03-09: 2 [IU] via SUBCUTANEOUS

## 2016-03-04 MED ORDER — ACETAMINOPHEN 325 MG PO TABS: 650.0000 mg | ORAL_TABLET | Freq: Four times a day (QID) | ORAL | Status: DC | PRN

## 2016-03-04 MED ORDER — SODIUM CHLORIDE 0.9 % IV SOLN
INTRAVENOUS | Status: DC
  Administered 2016-03-04: 02:00:00 via INTRAVENOUS

## 2016-03-04 MED ORDER — DEXTROSE 5 % IV SOLN
2.0000 g | Freq: Once | INTRAVENOUS | Status: AC
  Administered 2016-03-04: 2 g via INTRAVENOUS
  Filled 2016-03-04 (×2): qty 2

## 2016-03-04 MED ORDER — CEFTRIAXONE SODIUM 1 G IJ SOLR: 1.0000 g | Freq: Once | INTRAMUSCULAR | Status: DC

## 2016-03-04 MED ORDER — METOPROLOL TARTRATE 5 MG/5ML IV SOLN
2.5000 mg | Freq: Two times a day (BID) | INTRAVENOUS | Status: DC
  Administered 2016-03-04: 2.5 mg via INTRAVENOUS
  Filled 2016-03-04: qty 5

## 2016-03-04 MED ORDER — ERYTHROMYCIN 5 MG/GM OP OINT
TOPICAL_OINTMENT | Freq: Four times a day (QID) | OPHTHALMIC | Status: AC
  Administered 2016-03-04 – 2016-03-06 (×10): via OPHTHALMIC
  Administered 2016-03-06: 1 via OPHTHALMIC
  Administered 2016-03-07 – 2016-03-09 (×9): via OPHTHALMIC
  Filled 2016-03-04: qty 3.5

## 2016-03-04 MED ORDER — FUROSEMIDE 10 MG/ML IJ SOLN
20.0000 mg | Freq: Once | INTRAMUSCULAR | Status: AC
  Administered 2016-03-04: 20 mg via INTRAVENOUS
  Filled 2016-03-04: qty 2

## 2016-03-04 MED ORDER — METOPROLOL SUCCINATE ER 25 MG PO TB24
12.5000 mg | ORAL_TABLET | Freq: Every day | ORAL | Status: DC
  Administered 2016-03-05 – 2016-03-09 (×5): 12.5 mg via ORAL
  Filled 2016-03-04 (×5): qty 1

## 2016-03-04 MED ORDER — ONDANSETRON HCL 4 MG/2ML IJ SOLN: 4.0000 mg | Freq: Four times a day (QID) | INTRAMUSCULAR | Status: DC | PRN

## 2016-03-04 MED ORDER — BISACODYL 5 MG PO TBEC
5.0000 mg | DELAYED_RELEASE_TABLET | Freq: Every day | ORAL | Status: DC
  Administered 2016-03-04 – 2016-03-05 (×2): 5 mg via ORAL
  Filled 2016-03-04 (×2): qty 1

## 2016-03-04 MED ORDER — POTASSIUM CHLORIDE CRYS ER 20 MEQ PO TBCR
40.0000 meq | EXTENDED_RELEASE_TABLET | Freq: Once | ORAL | Status: AC
  Administered 2016-03-04: 40 meq via ORAL
  Filled 2016-03-04: qty 2

## 2016-03-04 MED ORDER — SODIUM CHLORIDE 0.9 % IV SOLN
Freq: Once | INTRAVENOUS | Status: AC
  Administered 2016-03-04: 11:00:00 via INTRAVENOUS

## 2016-03-04 MED ORDER — SODIUM CHLORIDE 0.9 % IV SOLN
Freq: Once | INTRAVENOUS | Status: AC
  Administered 2016-03-04: 05:00:00 via INTRAVENOUS

## 2016-03-04 MED ORDER — VANCOMYCIN HCL IN DEXTROSE 1-5 GM/200ML-% IV SOLN
1000.0000 mg | INTRAVENOUS | Status: DC
Start: 2016-03-04 — End: 2016-03-05
  Administered 2016-03-04 – 2016-03-05 (×2): 1000 mg via INTRAVENOUS
  Filled 2016-03-04 (×2): qty 200

## 2016-03-04 MED ORDER — ONDANSETRON HCL 4 MG PO TABS: 4.0000 mg | ORAL_TABLET | Freq: Four times a day (QID) | ORAL | Status: DC | PRN

## 2016-03-04 MED ORDER — SODIUM CHLORIDE 0.9 % IV SOLN
8.0000 mg/h | INTRAVENOUS | Status: DC
  Administered 2016-03-04: 8 mg/h via INTRAVENOUS
  Filled 2016-03-04 (×3): qty 80

## 2016-03-04 MED ORDER — PANTOPRAZOLE SODIUM 40 MG IV SOLR
40.0000 mg | Freq: Two times a day (BID) | INTRAVENOUS | Status: DC
  Administered 2016-03-04 – 2016-03-09 (×11): 40 mg via INTRAVENOUS
  Filled 2016-03-04 (×10): qty 40

## 2016-03-04 MED ORDER — SODIUM CHLORIDE 0.9 % IV SOLN
80.0000 mg | Freq: Once | INTRAVENOUS | Status: AC
  Administered 2016-03-04: 80 mg via INTRAVENOUS
  Filled 2016-03-04: qty 80

## 2016-03-04 MED ORDER — CEFEPIME HCL 1 G IJ SOLR
1.0000 g | Freq: Two times a day (BID) | INTRAMUSCULAR | Status: DC
  Administered 2016-03-04 – 2016-03-09 (×11): 1 g via INTRAVENOUS
  Filled 2016-03-04 (×12): qty 1

## 2016-03-04 NOTE — ED Notes (Signed)
Hbg 6.8

## 2016-03-04 NOTE — Progress Notes (Signed)
Pharmacy Antibiotic Note  Parker [name] is a 83 y.o. male admitted on 03/03/2016 with pneumonia and UTI.  Pharmacy has been consulted for Vanc/cefepime dosing.  Plan: 1) Vancomycin 1g IV q24 2) Cefepime 1g IV q12  Height: 5\' 7"  (170.2 cm) Weight: 200 lb (90.7 kg) IBW/kg (Calculated) : 66.1  Temp (24hrs), Avg:97.9 F (36.6 C), Min:97.4 F (36.3 C), Max:98.4 F (36.9 C)   Recent Labs Lab 03/03/16 2358 03/04/16 0008  WBC 11.5*  --   CREATININE  --  1.30*    Estimated Creatinine Clearance: 39.7 mL/min (by C-G formula based on SCr of 1.3 mg/dL (H)).    No Known Allergies  Thank you for allowing pharmacy to be a part of this patient's care.  Adrian Saran, PharmD, BCPS Pager [phone] 03/04/2016 7:41 AM

## 2016-03-04 NOTE — Consult Note (Signed)
Referring Provider: [facility]  Primary Care Physician:  Parker Roup, MD Primary Gastroenterologist: unassigned Reason for Consultation:  anemia  ASSESSMENT AND PLAN:  95. 83 yo male with new [place] anemia. Nearly a 5 gram drop in hgb from 11 to 6.8 since October labs.  He is heme positive in setting of anticoagulation. No overt bleeding, BUN is elevated at 34. He is s/p 2 units of blood, most recent hgb up slightly over a gram.  -I spoke with son Parker [name] over the phone. We both agree that patient would most likely be unable to tolerate a colonoscopy. No real indication for EGD except mildly elevated BUN (soft).  The son will certainly entertain anything that we feel is reasonably safe to pursue.  Maybe the risks / benefits of stopping anticoagulant will be considered.  -stop PPI gtt. Change to BID  2. UTI and possible PNA. On antibiotics  3. Aortic stenosis  3. AFIB, Apixaban on hold for now.   4. Advanced age / deconditioning  5. Constipation.  -Continue dulcolax -Add Miralax if can tolerate  HPI: Parker [name] is a 83 y.o. male with aortic stenosis, AFib, DM, and hx of prostate cancer. He is a resident of [place] admitted yesterday for mental status changes.  Head CTscan unremarkable, u/a concerning for UTI. CXR concerning for PNA. Found to be severely anemic with hgb of 6.8, down from baseine of 11 a few months back. He takes Eliquis. He is getting 2/2 units of blood now. Patient denies abdominal pain. He doesn't recall if anyone at facility has mentioned blood in stool at any time. He does offer that over the last several days he has been constipated.    Past Medical History:  Diagnosis Date  . Aortic stenosis   . Aortic stenosis   . Atrial fibrillation ([place])   . Bilateral leg edema   . Cardiac murmur   . Combined fat and carbohydrate induced hyperlipemia 10/28/2014  . Diabetes mellitus without complication ([place])   . Dyslipidemia   . Elevated brain  natriuretic peptide (BNP) level 09/01/2014  . Hyperlipidemia 10/28/2014  . LBP (low back pain) 10/28/2014  . Neurogenic bladder    self cath at home  . [place] cancer ([place])   . Vitamin B deficiency     Past Surgical History:  Procedure Laterality Date  . ORIF HUMERUS FRACTURE     Open reduction and internal fixation of transcondylar distal humerus fracture with intercondylar extension   . RADIAL HEAD ARTHROPLASTY      Prior to Admission medications   Medication Sig Start Date End Date Taking? Authorizing Provider  acetaminophen (TYLENOL) 500 MG tablet Take 500 mg by mouth every 8 (eight) hours.    Yes Historical Provider, MD  apixaban (ELIQUIS) 5 MG TABS tablet Take 1 tablet (5 mg total) by mouth 2 (two) times daily. 11/17/14  Yes Thayer Headings, MD  bicalutamide (CASODEX) 50 MG tablet Take 50 mg by mouth daily.   Yes Historical Provider, MD  cyanocobalamin (,VITAMIN B-12,) 1000 MCG/ML injection Inject 1,000 mcg into the muscle every 30 (thirty) days.  03/06/15  Yes Historical Provider, MD  Dextromethorphan-Guaifenesin (ROBAFEN DM) 10-100 MG/5ML liquid Take 10 mLs by mouth every 6 (six) hours as needed (cough).   Yes Historical Provider, MD  furosemide (LASIX) 40 MG tablet Take 40 mg by mouth 2 (two) times daily.  03/25/10  Yes Historical Provider, MD  HYDROcodone-acetaminophen (NORCO/VICODIN) 5-325 MG tablet Take 1 tablet by mouth 2 (two)  times daily as needed for moderate pain.  03/09/15  Yes Historical Provider, MD  metFORMIN (GLUCOPHAGE-XR) 500 MG 24 hr tablet Take 500 mg by mouth daily.  02/02/15  Yes Historical Provider, MD  metoprolol succinate (TOPROL XL) 25 MG 24 hr tablet Take 0.5 tablets (12.5 mg total) by mouth daily. 02/17/15  Yes Thayer Headings, MD  polyethylene glycol [facility] / Floria Raveling) packet Take 17 g by mouth daily as needed (for constipation).   Yes Historical Provider, MD  pravastatin (PRAVACHOL) 10 MG tablet Take 10 mg by mouth at bedtime.    Yes Historical Provider, MD    Sennosides-Docusate Sodium (SENEXON-S PO) Take 1 tablet by mouth daily. Hold for loose stools    Yes Historical Provider, MD    Current Facility-Administered Medications  Medication Dose Route Frequency Provider Last Rate Last Dose  . 0.9 %  sodium chloride infusion   Intravenous Continuous Parker A Regalado, MD 50 mL/hr at 03/04/16 1101    . acetaminophen (TYLENOL) tablet 650 mg  650 mg Oral Q6H PRN Parker Patience, MD       Or  . acetaminophen (TYLENOL) suppository 650 mg  650 mg Rectal Q6H PRN Parker Patience, MD      . bisacodyl (DULCOLAX) EC tablet 5 mg  5 mg Oral Daily Parker A Regalado, MD      . ceFEPIme (MAXIPIME) 1 g in dextrose 5 % 50 mL IVPB  1 g Intravenous Q12H Parker [name], RPH   1 g at 03/04/16 0845  . erythromycin ophthalmic ointment   Both Eyes Q6H Parker Patience, MD      . furosemide (LASIX) injection 20 mg  20 mg Intravenous Once Parker A Regalado, MD      . insulin aspart (novoLOG) injection 0-9 Units  0-9 Units Subcutaneous Q4H Parker Patience, MD      . metoprolol (LOPRESSOR) injection 2.5 mg  2.5 mg Intravenous Q12H Parker Patience, MD   2.5 mg at 03/04/16 0930  . ondansetron (ZOFRAN) tablet 4 mg  4 mg Oral Q6H PRN Parker Patience, MD       Or  . ondansetron [facility]) injection 4 mg  4 mg Intravenous Q6H PRN Parker Patience, MD      . pantoprazole (PROTONIX) 80 mg in sodium chloride 0.9 % 250 mL (0.32 mg/mL) infusion  8 mg/hr Intravenous Continuous Parker Palumbo, MD 25 mL/hr at 03/04/16 0132 8 mg/hr at 03/04/16 0132  . [START ON 03/07/2016] pantoprazole (PROTONIX) injection 40 mg  40 mg Intravenous Q12H Parker Palumbo, MD      . potassium chloride SA (K-DUR,KLOR-CON) CR tablet 40 mEq  40 mEq Oral Once Parker A Regalado, MD      . vancomycin (VANCOCIN) IVPB 1000 mg/200 mL premix  1,000 mg Intravenous Q24H Parker [name], RPH   1,000 mg at 03/04/16 [id number]    Allergies as of 03/03/2016  . (No Known Allergies)    Family History  Problem  Relation Age of Onset  . Family history unknown: Yes    Social History   Social History  . Marital status: Widowed    Spouse name: N/A  . Number of children: N/A  . Years of education: N/A   Occupational History  . Not on file.   Social History Main Topics  . Smoking status: Former Research scientist (life sciences)  . Smokeless tobacco: Never Used  . Alcohol use No  . Drug use: No  . Sexual activity: No   Other  Topics Concern  . Not on file   Social History Narrative  . No narrative on file    Review of Systems: All systems reviewed and negative except where noted in HPI.  Physical Exam: Vital signs in last 24 hours: Temp:  [97.4 F (36.3 C)-98.6 F (37 C)] 97.8 F (36.6 C) (03/02 1128) Pulse Rate:  [57-66] 57 (03/02 1128) Resp:  [10-20] 17 (03/02 1128) BP: (117-143)/(47-71) 121/53 (03/02 1128) SpO2:  [91 %-97 %] 93 % (03/02 1128) Weight:  [200 lb (90.7 kg)] 200 lb (90.7 kg) (03/02 0208)   General:   Alert,  obese, white male in NAD Eyes:  Sclera clear, no icterus.  Conjunctiva red. Ears:  Decreased auditory acuity. Nose:  No deformity, discharge,  or lesions. Mouth:  No deformity or lesions.   Neck:  Supple; no masses. Lungs:  Normal respiratory effort. A few coarse breath sound throughout chest.  Heart:  Regular rate and rhythm, loud murmur present. Abdomen:  Soft, protuberant, nontender,  BS active  Left abdomen is full but not obvious masses Rectal:  Deferred  Msk:  Symmetrical without gross deformities. . Extremities:  1+ BLE edema. Neurologic:  Alert and  oriented x4;  grossly normal neurologically. Skin:  Intact without significant lesions or rashes.. Psych:  Quiet but alert and cooperative.   Intake/Output from previous day: 03/01 0701 - 03/02 0700 In: 544 [I.V.:250; Blood:294] Out: [id number] [Urine:1050] Intake/Output this shift: No intake/output data recorded.  Lab Results:  Recent Labs  03/03/16 2358 03/04/16 0008 03/04/16 0825  WBC 11.5*  --  9.4  HGB 6.8* 7.5*  7.4*  HCT 22.2* 22.0* 23.1*  PLT 324  --  284   BMET  Recent Labs  03/04/16 0008 03/04/16 0825  NA 136 136  K 3.5 3.4*  CL 92* 97*  CO2  --  31  GLUCOSE 111* 102*  BUN 34* 34*  CREATININE 1.30* 1.16  CALCIUM  --  8.6*    Studies/Results: Dg Chest 2 View  Result Date: 03/03/2016 CLINICAL DATA:  Shortness of breath and altered mental status. EXAM: CHEST  2 VIEW COMPARISON:  None. FINDINGS: The heart is enlarged with aortic tortuosity and atherosclerosis. Lungs appear hyperinflated. Ill-defined opacity posteriorly on the lateral view may localize to the medial right lower lobe. No pulmonary edema or pleural fluid. No pneumothorax. Compression fractures in the mid and lower thoracic spine, lower compression fracture is chronic based on lumbar spine radiographs 03/11/2015. The bones are under mineralized. IMPRESSION: 1. Ill-defined retrocardiac opacity likely in the medial right lower lobe may be atelectasis or pneumonia. 2. Cardiomegaly with aortic tortuosity and atherosclerosis. 3. Compression fractures in the thoracic spine, at least 1 of which is chronic. Electronically Signed   By: [name] M.D.   On: 03/03/2016 23:36   Dg Abdomen 1 View  Result Date: 03/04/2016 CLINICAL DATA:  Acute onset of constipation.  Initial encounter. EXAM: ABDOMEN - 1 VIEW COMPARISON:  Lumbar spine radiographs performed 03/11/2015 FINDINGS: The visualized bowel gas pattern is unremarkable. Scattered air and stool filled loops of colon are seen; no abnormal dilatation of small bowel loops is seen to suggest small bowel obstruction. No free intra-abdominal air is identified, though evaluation for free air is limited on a single supine view. The visualized osseous structures are within normal limits; the sacroiliac joints are unremarkable in appearance. The visualized lung bases are essentially clear. Diffuse calcification is seen along the abdominal aorta. The abdominal aorta is tortuous in appearance.  IMPRESSION: 1.  Unremarkable bowel gas pattern; no free intra-abdominal air seen. Moderate amount of stool noted in the colon. 2. Diffuse aortic atherosclerosis. The abdominal aorta is tortuous in appearance. Electronically Signed   By: Garald Balding M.D.   On: 03/04/2016 01:30    Tye Savoy, NP-C @  03/04/2016, 12:29 PM  Pager number [phone]

## 2016-03-04 NOTE — H&P (Signed)
History and Physical    Parker [name] [id number] DOB: May 12, 1923 DOA: 03/03/2016  PCP: Ileana Roup, MD  Patient coming from: Nursing home.  Chief Complaint: Confusion.  HPI: Parker [name] is a 83 y.o. male with atrial fibrillation, diabetes mellitus type 2, aortic stenosis, hyperlipidemia and possible CHF was brought to the ER after patient was found to be increasingly confused. Most of the history was obtained from ER physician as patient's family is not at the bedside and patient is confused. On exam patient is alert to his name and does not follow commands.   ED Course: CT of the head was unremarkable. UA shows features concerning for UTI. Patient has chronic indwelling catheter. Lab work show a drop in hemoglobin from 11 4-5 months ago to 6.8 today. Stool occult blood is positive. Patient is on Apixaban. Chest x-ray shows a retrocardiac density. Patient is being admitted for acute encephalopathy and GI bleed on possible pneumonia with UTI.  Review of Systems: As per HPI, rest all negative.   Past Medical History:  Diagnosis Date  . Aortic stenosis   . Aortic stenosis   . Atrial fibrillation ([place])   . Bilateral leg edema   . Cardiac murmur   . Combined fat and carbohydrate induced hyperlipemia 10/28/2014  . Diabetes mellitus without complication ([place])   . Dyslipidemia   . Elevated brain natriuretic peptide (BNP) level 09/01/2014  . Hyperlipidemia 10/28/2014  . LBP (low back pain) 10/28/2014  . Neurogenic bladder    self cath at home  . Prostate cancer ([place])   . Vitamin B deficiency     Past Surgical History:  Procedure Laterality Date  . ORIF HUMERUS FRACTURE     Open reduction and internal fixation of transcondylar distal humerus fracture with intercondylar extension   . RADIAL HEAD ARTHROPLASTY       reports that he has quit smoking. He has never used smokeless tobacco. He reports that he does not drink alcohol or use drugs.  No Known Allergies  Family History    Problem Relation Age of Onset  . Family history unknown: Yes    Prior to Admission medications   Medication Sig Start Date End Date Taking? Authorizing Provider  acetaminophen (TYLENOL) 500 MG tablet Take 500 mg by mouth every 8 (eight) hours.    Yes Historical Provider, MD  apixaban (ELIQUIS) 5 MG TABS tablet Take 1 tablet (5 mg total) by mouth 2 (two) times daily. 11/17/14  Yes Thayer Headings, MD  bicalutamide (CASODEX) 50 MG tablet Take 50 mg by mouth daily.   Yes Historical Provider, MD  cyanocobalamin (,VITAMIN B-12,) 1000 MCG/ML injection Inject 1,000 mcg into the muscle every 30 (thirty) days.  03/06/15  Yes Historical Provider, MD  Dextromethorphan-Guaifenesin (ROBAFEN DM) 10-100 MG/5ML liquid Take 10 mLs by mouth every 6 (six) hours as needed (cough).   Yes Historical Provider, MD  furosemide (LASIX) 40 MG tablet Take 40 mg by mouth 2 (two) times daily.  03/25/10  Yes Historical Provider, MD  HYDROcodone-acetaminophen (NORCO/VICODIN) 5-325 MG tablet Take 1 tablet by mouth 2 (two) times daily as needed for moderate pain.  03/09/15  Yes Historical Provider, MD  metFORMIN (GLUCOPHAGE-XR) 500 MG 24 hr tablet Take 500 mg by mouth daily.  02/02/15  Yes Historical Provider, MD  metoprolol succinate (TOPROL XL) 25 MG 24 hr tablet Take 0.5 tablets (12.5 mg total) by mouth daily. 02/17/15  Yes Thayer Headings, MD  polyethylene glycol [facility] / Floria Raveling) packet Take 17 g  by mouth daily as needed (for constipation).   Yes Historical Provider, MD  pravastatin (PRAVACHOL) 10 MG tablet Take 10 mg by mouth at bedtime.    Yes Historical Provider, MD  Sennosides-Docusate Sodium (SENEXON-S PO) Take 1 tablet by mouth daily. Hold for loose stools    Yes Historical Provider, MD    Physical Exam: Vitals:   03/03/16 2233 03/03/16 2241 03/04/16 0145 03/04/16 0208  BP: 130/71  126/61   Pulse: 65  61   Resp: 20  14   Temp: 97.4 F (36.3 C)     TempSrc: Oral     SpO2: 92% 94% 91%   Weight:    90.7 kg (200  lb)  Height:    5\' 7"  (1.702 m)      Constitutional: Moderately built and nourished. Vitals:   03/03/16 2233 03/03/16 2241 03/04/16 0145 03/04/16 0208  BP: 130/71  126/61   Pulse: 65  61   Resp: 20  14   Temp: 97.4 F (36.3 C)     TempSrc: Oral     SpO2: 92% 94% 91%   Weight:    90.7 kg (200 lb)  Height:    5\' 7"  (1.702 m)   Eyes: Anicteric no pallor. Bilateral conjunctivitis. ENMT: No discharge from the ears nose mouth. Neck: No neck rigidity. Respiratory: No rhonchi or crepitations. Cardiovascular: [id number] heard systolic murmur heard. Abdomen: Soft nontender bowel sounds present. Musculoskeletal: Bilateral lower extremity edema probably chronic. Skin: Chronic skin changes. Neurologic: Alert awake oriented to his name. Does not follow commands. Psychiatric: Appears confused.   Labs on Admission: I have personally reviewed following labs and imaging studies  CBC:  Recent Labs Lab 03/03/16 2358 03/04/16 0008  WBC 11.5*  --   NEUTROABS 7.8*  --   HGB 6.8* 7.5*  HCT 22.2* 22.0*  MCV 82.2  --   PLT 324  --    Basic Metabolic Panel:  Recent Labs Lab 03/04/16 0008  NA 136  K 3.5  CL 92*  GLUCOSE 111*  BUN 34*  CREATININE 1.30*   GFR: Estimated Creatinine Clearance: 39.7 mL/min (by C-G formula based on SCr of 1.3 mg/dL (H)). Liver Function Tests: No results for input(s): AST, ALT, ALKPHOS, BILITOT, PROT, ALBUMIN in the last 168 hours. No results for input(s): LIPASE, AMYLASE in the last 168 hours. No results for input(s): AMMONIA in the last 168 hours. Coagulation Profile: No results for input(s): INR, PROTIME in the last 168 hours. Cardiac Enzymes: No results for input(s): CKTOTAL, CKMB, CKMBINDEX, TROPONINI in the last 168 hours. BNP (last 3 results) No results for input(s): PROBNP in the last 8760 hours. HbA1C: No results for input(s): HGBA1C in the last 72 hours. CBG: No results for input(s): GLUCAP in the last 168 hours. Lipid Profile: No results  for input(s): CHOL, HDL, LDLCALC, TRIG, CHOLHDL, LDLDIRECT in the last 72 hours. Thyroid Function Tests: No results for input(s): TSH, T4TOTAL, FREET4, T3FREE, THYROIDAB in the last 72 hours. Anemia Panel: No results for input(s): VITAMINB12, FOLATE, FERRITIN, TIBC, IRON, RETICCTPCT in the last 72 hours. Urine analysis:    Component Value Date/Time   COLORURINE YELLOW 03/03/2016 0013   APPEARANCEUR TURBID (A) 03/03/2016 0013   LABSPEC 1.010 03/03/2016 0013   PHURINE 5.0 03/03/2016 0013   GLUCOSEU NEGATIVE 03/03/2016 0013   HGBUR MODERATE (A) 03/03/2016 0013   BILIRUBINUR NEGATIVE 03/03/2016 0013   KETONESUR NEGATIVE 03/03/2016 0013   PROTEINUR 100 (A) 03/03/2016 0013   UROBILINOGEN 0.2 07/28/2014 0117  NITRITE NEGATIVE 03/03/2016 0013   LEUKOCYTESUR LARGE (A) 03/03/2016 0013   Sepsis Labs: @LABRCNTIP (procalcitonin:4,lacticidven:4) )No results found for this or any previous visit (from the past 240 hour(s)).   Radiological Exams on Admission: Dg Chest 2 View  Result Date: 03/03/2016 CLINICAL DATA:  Shortness of breath and altered mental status. EXAM: CHEST  2 VIEW COMPARISON:  None. FINDINGS: The heart is enlarged with aortic tortuosity and atherosclerosis. Lungs appear hyperinflated. Ill-defined opacity posteriorly on the lateral view may localize to the medial right lower lobe. No pulmonary edema or pleural fluid. No pneumothorax. Compression fractures in the mid and lower thoracic spine, lower compression fracture is chronic based on lumbar spine radiographs 03/11/2015. The bones are under mineralized. IMPRESSION: 1. Ill-defined retrocardiac opacity likely in the medial right lower lobe may be atelectasis or pneumonia. 2. Cardiomegaly with aortic tortuosity and atherosclerosis. 3. Compression fractures in the thoracic spine, at least 1 of which is chronic. Electronically Signed   By: [name] M.D.   On: 03/03/2016 23:36   Dg Abdomen 1 View  Result Date: 03/04/2016 CLINICAL  DATA:  Acute onset of constipation.  Initial encounter. EXAM: ABDOMEN - 1 VIEW COMPARISON:  Lumbar spine radiographs performed 03/11/2015 FINDINGS: The visualized bowel gas pattern is unremarkable. Scattered air and stool filled loops of colon are seen; no abnormal dilatation of small bowel loops is seen to suggest small bowel obstruction. No free intra-abdominal air is identified, though evaluation for free air is limited on a single supine view. The visualized osseous structures are within normal limits; the sacroiliac joints are unremarkable in appearance. The visualized lung bases are essentially clear. Diffuse calcification is seen along the abdominal aorta. The abdominal aorta is tortuous in appearance. IMPRESSION: 1. Unremarkable bowel gas pattern; no free intra-abdominal air seen. Moderate amount of stool noted in the colon. 2. Diffuse aortic atherosclerosis. The abdominal aorta is tortuous in appearance. Electronically Signed   By: Garald Balding M.D.   On: 03/04/2016 01:30   Ct Head Wo Contrast  Result Date: 03/04/2016 CLINICAL DATA:  Acute onset of altered mental status. Initial encounter. EXAM: CT HEAD WITHOUT CONTRAST TECHNIQUE: Contiguous axial images were obtained from the base of the skull through the vertex without intravenous contrast. COMPARISON:  CT of the head performed 07/28/2014 FINDINGS: Brain: No evidence of acute infarction, hemorrhage, hydrocephalus, extra-axial collection or mass lesion/mass effect. Prominence of the ventricles and sulci reflects moderately severe cortical volume loss. Cerebellar atrophy is noted. Diffuse periventricular and subcortical white matter change likely reflects small vessel ischemic microangiopathy. Chronic ischemic change is seen at the external capsule bilaterally. The brainstem and fourth ventricle are within normal limits. The cerebral hemispheres demonstrate grossly normal gray-white differentiation. No mass effect or midline shift is seen. Vascular: No  hyperdense vessel or unexpected calcification. Skull: There is no evidence of fracture; visualized osseous structures are unremarkable in appearance. Sinuses/Orbits: The orbits are within normal limits. The paranasal sinuses and mastoid air cells are well-aerated. Other: No significant soft tissue abnormalities are seen. IMPRESSION: 1. No acute intracranial pathology seen on CT. 2. Moderately severe cortical volume loss and diffuse small vessel ischemic microangiopathy. 3. Chronic ischemic change at the external capsule bilaterally. Electronically Signed   By: Garald Balding M.D.   On: 03/04/2016 01:32     Assessment/Plan Active Problems:   Diabetes type 2, controlled ([place])   Aortic stenosis   Atrial fibrillation ([facility])   Acute GI bleeding   UTI (urinary tract infection)   Acute encephalopathy  1. Acute encephalopathy - likely from UTI and pneumonia. Baseline mental status is not known. Need to contact family in the morning.  2. Acute GI bleeding - we'll hold Apixaban. Patient has been already started on Protonix infusion. I have ordered 1 unit of PRBC. Follow CBC. Consult gastroenterologist in a.m. 3. UTI and possible pneumonia - follow cultures. Patient is placed on vancomycin and cefepime. 4. Diabetes mellitus type 2 - patient's on sliding scale coverage. 5. Chronic A. Fib - holding off Apixaban due to acute GI bleed. IV metoprolol until patient can take orally. 6. Aortic stenosis with possible CHF - patient is not in respiratory distress but does have bilateral lower extremity edema. Closely observe.   DVT prophylaxis: SCDs. Code Status: DO NOT RESUSCITATE.  Family Communication: No family at the bedside.  Disposition Plan: Back to nursing home.  Consults called: None.  Admission status: Inpatient.    Rise Patience MD [facility] Pager [phone].  If 7PM-7AM, please contact night-coverage [email or web address] Password [facility]  03/04/2016, 2:31 AM

## 2016-03-04 NOTE — ED Notes (Signed)
Pt son Neno Askin phone # [phone]

## 2016-03-04 NOTE — Progress Notes (Signed)
PROGRESS NOTE    Parker [name]  [id number] DOB: 12/03/1923 DOA: 03/03/2016 PCP: Ileana Roup, MD    Brief Narrative: Parker [name] is a 83 y.o. male with atrial fibrillation, diabetes mellitus type 2, aortic stenosis, hyperlipidemia and possible CHF was brought to the ER after patient was found to be increasingly confused. Most of the history was obtained from ER physician as patient's family is not at the bedside and patient is confused. On exam patient is alert to his name and does not follow commands.   Assessment & Plan:   Active Problems:   Diabetes type 2, controlled ([place])   Aortic stenosis   Atrial fibrillation (H[facility])   Acute GI bleeding   UTI (urinary tract infection)   Acute encephalopathy  Acute blood loss Anemia, occult blood positive.  For month ago hb was at 11. On admission at 6. Received one unit PRBC.  Will transfuse another unit.  Occult blood positive.  Stop anticoagulation.  GI consulted.   PNA; continue with IV antibiotics.   UTI; secondary to chronic foley catheter follow culture. Continue with IV antibiotics.  Replace foley./   Acute encephalopathy; toxic secondary to infection.   Hypokalemia; replace orally.   A fib; on metoprolol. Hold apixiban.   DM; hold metformin. SSI.   DVT prophylaxis: scd.  Code Status: dnr Family Communication: son who was at bedside.  Disposition Plan: remain inpatient.  Consultants:   gi   Procedures: none   Antimicrobials:  Vancomycin 3-2 Cefepime 3-2  Subjective:   Objective: Vitals:   03/04/16 0545 03/04/16 0650 03/04/16 0700 03/04/16 0931  BP: 137/71 (!) 143/71 129/67 (!) 143/67  Pulse: 62 61 61 66  Resp: 18 18 18    Temp: 98 F (36.7 C) 97.7 F (36.5 C) 97.8 F (36.6 C)   TempSrc: Oral Oral Oral   SpO2: 97% 97% 97%   Weight:      Height:        Intake/Output Summary (Last 24 hours) at 03/04/16 1040 Last data filed at 03/04/16 0700  Gross per 24 hour  Intake              544 ml    Output             1050 ml  Net             -506 ml   Filed Weights   03/04/16 0208  Weight: 90.7 kg (200 lb)    Examination:  General exam: lethargic, open eyes to voice, falls back sleep/  Respiratory system: Clear to auscultation. Respiratory effort normal. Decrease breath sounds.  Cardiovascular system: S1 & S2 heard, RRR. No JVD, murmurs, rubs, gallops or clicks. Trace  pedal edema. Gastrointestinal system: Abdomen is nondistended, soft and nontender. No organomegaly or masses felt. Normal bowel sounds heard. Central nervous system:lethargic Extremities: Symmetric 5 x 5 power. Skin: No rashes, lesions or ulcers     Data Reviewed: I have personally reviewed following labs and imaging studies  CBC:  Recent Labs Lab 03/03/16 2358 03/04/16 0008 03/04/16 0825  WBC 11.5*  --  9.4  NEUTROABS 7.8*  --   --   HGB 6.8* 7.5* 7.4*  HCT 22.2* 22.0* 23.1*  MCV 82.2  --  82.8  PLT 324  --  [id number]   Basic Metabolic Panel:  Recent Labs Lab 03/04/16 0008 03/04/16 0825  NA 136 136  K 3.5 3.4*  CL 92* 97*  CO2  --  31  GLUCOSE 111* 102*  BUN 34* 34*  CREATININE 1.30* 1.16  CALCIUM  --  8.6*   GFR: Estimated Creatinine Clearance: 44.5 mL/min (by C-G formula based on SCr of 1.16 mg/dL). Liver Function Tests: No results for input(s): AST, ALT, ALKPHOS, BILITOT, PROT, ALBUMIN in the last 168 hours. No results for input(s): LIPASE, AMYLASE in the last 168 hours. No results for input(s): AMMONIA in the last 168 hours. Coagulation Profile: No results for input(s): INR, PROTIME in the last 168 hours. Cardiac Enzymes: No results for input(s): CKTOTAL, CKMB, CKMBINDEX, TROPONINI in the last 168 hours. BNP (last 3 results) No results for input(s): PROBNP in the last 8760 hours. HbA1C: No results for input(s): HGBA1C in the last 72 hours. CBG:  Recent Labs Lab 03/04/16 0353 03/04/16 0721  GLUCAP 102* 102*   Lipid Profile: No results for input(s): CHOL, HDL, LDLCALC,  TRIG, CHOLHDL, LDLDIRECT in the last 72 hours. Thyroid Function Tests: No results for input(s): TSH, T4TOTAL, FREET4, T3FREE, THYROIDAB in the last 72 hours. Anemia Panel: No results for input(s): VITAMINB12, FOLATE, FERRITIN, TIBC, IRON, RETICCTPCT in the last 72 hours. Sepsis Labs: No results for input(s): PROCALCITON, LATICACIDVEN in the last 168 hours.  Recent Results (from the past 240 hour(s))  MRSA PCR Screening     Status: None   Collection Time: 03/04/16  8:00 AM  Result Value Ref Range Status   MRSA by PCR NEGATIVE NEGATIVE Final    Comment:        The GeneXpert MRSA Assay (FDA approved for NASAL specimens only), is one component of a comprehensive MRSA colonization surveillance program. It is not intended to diagnose MRSA infection nor to guide or monitor treatment for MRSA infections.          Radiology Studies: Dg Chest 2 View  Result Date: 03/03/2016 CLINICAL DATA:  Shortness of breath and altered mental status. EXAM: CHEST  2 VIEW COMPARISON:  None. FINDINGS: The heart is enlarged with aortic tortuosity and atherosclerosis. Lungs appear hyperinflated. Ill-defined opacity posteriorly on the lateral view may localize to the medial right lower lobe. No pulmonary edema or pleural fluid. No pneumothorax. Compression fractures in the mid and lower thoracic spine, lower compression fracture is chronic based on lumbar spine radiographs 03/11/2015. The bones are under mineralized. IMPRESSION: 1. Ill-defined retrocardiac opacity likely in the medial right lower lobe may be atelectasis or pneumonia. 2. Cardiomegaly with aortic tortuosity and atherosclerosis. 3. Compression fractures in the thoracic spine, at least 1 of which is chronic. Electronically Signed   By: [name] M.D.   On: 03/03/2016 23:36   Dg Abdomen 1 View  Result Date: 03/04/2016 CLINICAL DATA:  Acute onset of constipation.  Initial encounter. EXAM: ABDOMEN - 1 VIEW COMPARISON:  Lumbar spine radiographs  performed 03/11/2015 FINDINGS: The visualized bowel gas pattern is unremarkable. Scattered air and stool filled loops of colon are seen; no abnormal dilatation of small bowel loops is seen to suggest small bowel obstruction. No free intra-abdominal air is identified, though evaluation for free air is limited on a single supine view. The visualized osseous structures are within normal limits; the sacroiliac joints are unremarkable in appearance. The visualized lung bases are essentially clear. Diffuse calcification is seen along the abdominal aorta. The abdominal aorta is tortuous in appearance. IMPRESSION: 1. Unremarkable bowel gas pattern; no free intra-abdominal air seen. Moderate amount of stool noted in the colon. 2. Diffuse aortic atherosclerosis. The abdominal aorta is tortuous in appearance. Electronically Signed   By: Francoise Schaumann.D.  On: 03/04/2016 01:30   Ct Head Wo Contrast  Result Date: 03/04/2016 CLINICAL DATA:  Acute onset of altered mental status. Initial encounter. EXAM: CT HEAD WITHOUT CONTRAST TECHNIQUE: Contiguous axial images were obtained from the base of the skull through the vertex without intravenous contrast. COMPARISON:  CT of the head performed 07/28/2014 FINDINGS: Brain: No evidence of acute infarction, hemorrhage, hydrocephalus, extra-axial collection or mass lesion/mass effect. Prominence of the ventricles and sulci reflects moderately severe cortical volume loss. Cerebellar atrophy is noted. Diffuse periventricular and subcortical white matter change likely reflects small vessel ischemic microangiopathy. Chronic ischemic change is seen at the external capsule bilaterally. The brainstem and fourth ventricle are within normal limits. The cerebral hemispheres demonstrate grossly normal gray-white differentiation. No mass effect or midline shift is seen. Vascular: No hyperdense vessel or unexpected calcification. Skull: There is no evidence of fracture; visualized osseous  structures are unremarkable in appearance. Sinuses/Orbits: The orbits are within normal limits. The paranasal sinuses and mastoid air cells are well-aerated. Other: No significant soft tissue abnormalities are seen. IMPRESSION: 1. No acute intracranial pathology seen on CT. 2. Moderately severe cortical volume loss and diffuse small vessel ischemic microangiopathy. 3. Chronic ischemic change at the external capsule bilaterally. Electronically Signed   By: Garald Balding M.D.   On: 03/04/2016 01:32        Scheduled Meds: . sodium chloride   Intravenous Once  . bisacodyl  5 mg Oral Daily  . ceFEPime (MAXIPIME) IV  1 g Intravenous Q12H  . erythromycin   Both Eyes Q6H  . furosemide  20 mg Intravenous Once  . insulin aspart  0-9 Units Subcutaneous Q4H  . metoprolol  2.5 mg Intravenous Q12H  . [START ON 03/07/2016] pantoprazole  40 mg Intravenous Q12H  . potassium chloride  40 mEq Oral Once  . vancomycin  1,000 mg Intravenous Q24H   Continuous Infusions: . sodium chloride 75 mL/hr at 03/04/16 0138  . pantoprozole (PROTONIX) infusion 8 mg/hr (03/04/16 0132)     LOS: 0 days    Time spent: 35 minutes.     Elmarie Shiley, MD [facility] Pager [phone]  If 7PM-7AM, please contact night-coverage [email or web address] Password [email or web address] 03/04/2016, 10:40 AM

## 2016-03-04 NOTE — Clinical Social Work Note (Signed)
Clinical Social Work Assessment  Patient Details  Name: Parker [name] MRN: [id number] Date of Birth: 1923/11/19  Date of referral:  03/04/16               Reason for consult:  Facility Placement                Permission sought to share information with:    Permission granted to share information::     Name::     [place]::  [facility]  Relationship::  Son  Contact Information:  [phone]  Housing/Transportation Living arrangements for the past 2 months:  [place] [facility]) Source of Information:  Adult Children Patient Interpreter Needed:  None Criminal Activity/Legal Involvement Pertinent to Current Situation/Hospitalization:  No - Comment as needed Significant Relationships:  Adult Children Lives with:  Facility Resident Do you feel safe going back to the place where you live?  Yes Need for family participation in patient care:  Yes (Comment)  Care giving concerns:  No care giving concerns.    Social Worker assessment / plan:  LCSWA met with patient and son at bedside. Patient sleeping at time of assessment. Patient son reports the patient is a resident at [facility]. Plan is for patient to return.  Plan assist with patient discharge back to ALF.  Called admission personal to ask additional questions about patient baseline at facility. Waiting for return call.   Employment status:  Retired Nurse, adult PT Recommendations:  Not assessed at this time Information / Referral to community resources:     Patient/Family's Response to care:  Agreeable  Patient/Family's Understanding of and Emotional Response to Diagnosis, Current Treatment, and Prognosis:  Patient son primary contact and wants to contacted with updates to understand patient plan of care.   Emotional Assessment Appearance:  Developmentally appropriate Attitude/Demeanor/Rapport:    Affect  (typically observed):    Orientation:  Oriented to Self, Oriented to Place, Oriented to Situation Alcohol / Substance use:  Not Applicable Psych involvement (Current and /or in the community):     Discharge Needs  Concerns to be addressed:  Discharge Planning Concerns Readmission within the last 30 days:  No Current discharge risk:  None Barriers to Discharge:  Continued Medical Work up   [employer], LCSW 03/04/2016, 11:16 AM

## 2016-03-05 LAB — BPAM RBC
BLOOD PRODUCT EXPIRATION DATE: 201803282359
Blood Product Expiration Date: 201803192359
ISSUE DATE / TIME: 201803020414
ISSUE DATE / TIME: 201803021103
UNIT TYPE AND RH: 6200
Unit Type and Rh: 6200

## 2016-03-05 LAB — GLUCOSE, CAPILLARY
GLUCOSE-CAPILLARY: 89 mg/dL (ref 65–99)
GLUCOSE-CAPILLARY: 108 mg/dL — AB (ref 65–99)
GLUCOSE-CAPILLARY: 113 mg/dL — AB (ref 65–99)
Glucose-Capillary: 105 mg/dL — ABNORMAL HIGH (ref 65–99)
Glucose-Capillary: 110 mg/dL — ABNORMAL HIGH (ref 65–99)
Glucose-Capillary: 85 mg/dL (ref 65–99)

## 2016-03-05 LAB — CBC
HEMATOCRIT: 25.8 % — AB (ref 39.0–52.0)
Hemoglobin: 8.2 g/dL — ABNORMAL LOW (ref 13.0–17.0)
MCH: 26.5 pg (ref 26.0–34.0)
MCHC: 31.8 g/dL (ref 30.0–36.0)
MCV: 83.5 fL (ref 78.0–100.0)
PLATELETS: 297 10*3/uL (ref 150–400)
RBC: 3.09 MIL/uL — ABNORMAL LOW (ref 4.22–5.81)
RDW: 16 % — AB (ref 11.5–15.5)
WBC: 9.1 10*3/uL (ref 4.0–10.5)

## 2016-03-05 LAB — TYPE AND SCREEN
ABO/RH(D): A POS
Antibody Screen: NEGATIVE
UNIT DIVISION: 0
UNIT DIVISION: 0

## 2016-03-05 LAB — BASIC METABOLIC PANEL
ANION GAP: 6 (ref 5–15)
BUN: 18 mg/dL (ref 6–20)
CALCIUM: 8.2 mg/dL — AB (ref 8.9–10.3)
CO2: 29 mmol/L (ref 22–32)
Chloride: 103 mmol/L (ref 101–111)
Creatinine, Ser: 0.75 mg/dL (ref 0.61–1.24)
GFR calc Af Amer: >60 mL/min (ref >60)
GLUCOSE: 92 mg/dL (ref 65–99)
Potassium: 3.7 mmol/L (ref 3.5–5.1)
Sodium: 138 mmol/L (ref 135–145)

## 2016-03-05 MED ORDER — IPRATROPIUM-ALBUTEROL 0.5-2.5 (3) MG/3ML IN SOLN
3.0000 mL | Freq: Two times a day (BID) | RESPIRATORY_TRACT | Status: DC
  Administered 2016-03-05 – 2016-03-09 (×7): 3 mL via RESPIRATORY_TRACT
  Filled 2016-03-05 (×8): qty 3

## 2016-03-05 MED ORDER — POLYETHYLENE GLYCOL 3350 17 G PO PACK
17.0000 g | PACK | Freq: Two times a day (BID) | ORAL | Status: DC
  Administered 2016-03-05 – 2016-03-09 (×8): 17 g via ORAL
  Filled 2016-03-05 (×8): qty 1

## 2016-03-05 MED ORDER — FUROSEMIDE 20 MG PO TABS
20.0000 mg | ORAL_TABLET | Freq: Once | ORAL | Status: AC
  Administered 2016-03-05: 20 mg via ORAL
  Filled 2016-03-05: qty 1

## 2016-03-05 MED ORDER — POTASSIUM CHLORIDE CRYS ER 20 MEQ PO TBCR
40.0000 meq | EXTENDED_RELEASE_TABLET | Freq: Once | ORAL | Status: AC
  Administered 2016-03-05: 40 meq via ORAL
  Filled 2016-03-05: qty 2

## 2016-03-05 MED ORDER — IPRATROPIUM-ALBUTEROL 0.5-2.5 (3) MG/3ML IN SOLN
3.0000 mL | Freq: Four times a day (QID) | RESPIRATORY_TRACT | Status: DC
  Administered 2016-03-05: 3 mL via RESPIRATORY_TRACT
  Filled 2016-03-05: qty 3

## 2016-03-05 NOTE — Progress Notes (Signed)
CROSS COVER FOR [facility]  Subjective: Patient seems to be stable today with no evidence of overt GI bleeding. His son [name] was at the bedside is concerned about having workup done from a GI standpoint with regards to EGD or colonoscopy. He claims he would like to know where his father's bleeding from but is concerned about the risks of the procedures. Patient denies having any abdominal pain nausea vomiting has not had a bowel movement today. He seems to be more alert and cooperative today he has he has been having some problem with constipation prior to admission and has been on Dulcolax and MiraLAX since admission with minimal results.   Objective: Vital signs in last 24 hours: Temp:  [97.8 F (36.6 C)-98.6 F (37 C)] 98.5 F (36.9 C) (03/03 0410) Pulse Rate:  [57-66] 66 (03/03 0410) Resp:  [17-20] 19 (03/03 0410) BP: (117-143)/(47-67) 127/62 (03/03 0410) SpO2:  [92 %-94 %] 92 % (03/03 0410) Last BM Date:  (pt. not sure)  Intake/Output from previous day: 03/02 0701 - 03/03 0700 In: 3171.3 [P.O.:370; I.V.:2151.3; Blood:350; IV Piggyback:300] Out: 4000 [Urine:4000] Intake/Output this shift: No intake/output data recorded.  General appearance: cooperative, distracted, fatigued, no distress, moderately obese and pale Resp: extensive rhonchi bilaterally Cardio: irregular rate and rhythm, S1, S2 normal, large systolic murmur heard, no click, rub or gallop GI: soft, morbidly obese, non-tender; bowel sounds normal; no masses,  no organomegaly  Lab Results:  Recent Labs  03/03/16 2358 03/04/16 0008 03/04/16 0825 03/05/16 0538  WBC 11.5*  --  9.4 9.1  HGB 6.8* 7.5* 7.4* 8.2*  HCT 22.2* 22.0* 23.1* 25.8*  PLT 324  --  284 297   BMET  Recent Labs  03/04/16 0008 03/04/16 0825 03/05/16 0538  NA 136 136 138  K 3.5 3.4* 3.7  CL 92* 97* 103  CO2  --  31 29  GLUCOSE 111* 102* 92  BUN 34* 34* 18  CREATININE 1.30* 1.16 0.75  CALCIUM  --  8.6* 8.2*   Studies/Results: Dg Chest 2  View  Result Date: 03/03/2016 CLINICAL DATA:  Shortness of breath and altered mental status. EXAM: CHEST  2 VIEW COMPARISON:  None. FINDINGS: The heart is enlarged with aortic tortuosity and atherosclerosis. Lungs appear hyperinflated. Ill-defined opacity posteriorly on the lateral view may localize to the medial right lower lobe. No pulmonary edema or pleural fluid. No pneumothorax. Compression fractures in the mid and lower thoracic spine, lower compression fracture is chronic based on lumbar spine radiographs 03/11/2015. The bones are under mineralized. IMPRESSION: 1. Ill-defined retrocardiac opacity likely in the medial right lower lobe may be atelectasis or pneumonia. 2. Cardiomegaly with aortic tortuosity and atherosclerosis. 3. Compression fractures in the thoracic spine, at least 1 of which is chronic. Electronically Signed   By: [name] M.D.   On: 03/03/2016 23:36   Dg Abdomen 1 View  Result Date: 03/04/2016 CLINICAL DATA:  Acute onset of constipation.  Initial encounter. EXAM: ABDOMEN - 1 VIEW COMPARISON:  Lumbar spine radiographs performed 03/11/2015 FINDINGS: The visualized bowel gas pattern is unremarkable. Scattered air and stool filled loops of colon are seen; no abnormal dilatation of small bowel loops is seen to suggest small bowel obstruction. No free intra-abdominal air is identified, though evaluation for free air is limited on a single supine view. The visualized osseous structures are within normal limits; the sacroiliac joints are unremarkable in appearance. The visualized lung bases are essentially clear. Diffuse calcification is seen along the abdominal aorta. The abdominal  aorta is tortuous in appearance. IMPRESSION: 1. Unremarkable bowel gas pattern; no free intra-abdominal air seen. Moderate amount of stool noted in the colon. 2. Diffuse aortic atherosclerosis. The abdominal aorta is tortuous in appearance. Electronically Signed   By: Garald Balding M.D.   On: 03/04/2016  01:30   Ct Head Wo Contrast  Result Date: 03/04/2016 CLINICAL DATA:  Acute onset of altered mental status. Initial encounter. EXAM: CT HEAD WITHOUT CONTRAST TECHNIQUE: Contiguous axial images were obtained from the base of the skull through the vertex without intravenous contrast. COMPARISON:  CT of the head performed 07/28/2014 FINDINGS: Brain: No evidence of acute infarction, hemorrhage, hydrocephalus, extra-axial collection or mass lesion/mass effect. Prominence of the ventricles and sulci reflects moderately severe cortical volume loss. Cerebellar atrophy is noted. Diffuse periventricular and subcortical white matter change likely reflects small vessel ischemic microangiopathy. Chronic ischemic change is seen at the external capsule bilaterally. The brainstem and fourth ventricle are within normal limits. The cerebral hemispheres demonstrate grossly normal gray-white differentiation. No mass effect or midline shift is seen. Vascular: No hyperdense vessel or unexpected calcification. Skull: There is no evidence of fracture; visualized osseous structures are unremarkable in appearance. Sinuses/Orbits: The orbits are within normal limits. The paranasal sinuses and mastoid air cells are well-aerated. Other: No significant soft tissue abnormalities are seen. IMPRESSION: 1. No acute intracranial pathology seen on CT. 2. Moderately severe cortical volume loss and diffuse small vessel ischemic microangiopathy. 3. Chronic ischemic change at the external capsule bilaterally. Electronically Signed   By: Garald Balding M.D.   On: 03/04/2016 01:32   Medications: I have reviewed the patient's current medications.  Assessment/Plan: 1) Guaiac-positive stools with severe anemia on anticoagulants, currently stable. Due to his comorbidities there are no plans to proceed with any endoscopic procedures at this time. I have discussed this with the patient's son [name] in great detail. Plans are to maintain him on a full liquid  diet for now and monitor his CBCs closely. His Eliquis is on hold.  2) UTI/PNA-on BSA.  3) Acute encephaloptahy-probably secondary to infection. Seems to have improved somewhat since admission.  4) Chronic atrial fibrillation/aortic stenosis-his Eliquis is on hold for now.  LOS: 1 day   [facility] Demuro 03/05/2016, 8:34 AM

## 2016-03-05 NOTE — Progress Notes (Addendum)
PROGRESS NOTE    Parker [name]  [id number] DOB: August 05, 1923 DOA: 03/03/2016 PCP: Ileana Roup, MD    Brief Narrative: Parker [name] is a 83 y.o. male with atrial fibrillation, diabetes mellitus type 2, aortic stenosis, hyperlipidemia and possible CHF was brought to the ER after patient was found to be increasingly confused. Most of the history was obtained from ER physician as patient's family is not at the bedside and patient is confused. On exam patient is alert to his name and does not follow commands.   Assessment & Plan:   Active Problems:   Diabetes type 2, controlled ([place])   Aortic stenosis   Atrial fibrillation ([facility])   Acute GI bleeding   UTI (urinary tract infection)   Acute encephalopathy  Acute blood loss Anemia, occult blood positive.  Four month ago hb was at 11. On admission at 6. He has Received 2 unit PRBC.  Occult blood positive.  Stop anticoagulation.  GI consulted and following. No procedure for now.  Hb increased to 8.   PNA; continue with IV antibiotics.  He has some wheezing on lung exam. Will order nebulizer and lasix.   UTI; secondary to chronic foley catheter follow culture growing gram negatives rods. . Continue with IV antibiotics.  Foley catheter exchange 3-02.   Acute encephalopathy; toxic secondary to infection.  More alert today. Able to follows command.   Hypokalemia; replace orally.   A fib; on metoprolol. Hold apixiban in setting of GI bleed. .   DM; hold metformin. SSI.     DVT prophylaxis: scd.  Code Status: dnr Family Communication: son who was at bedside.  Disposition Plan: remain inpatient.  Consultants:   gi   Procedures: none   Antimicrobials:  Vancomycin 3-2 Cefepime 3-2  Subjective: He is alert this morning. Doesn't know how he is doing.  Denies worsening dyspnea.    Objective: Vitals:   03/04/16 1336 03/04/16 1956 03/05/16 0410 03/05/16 1047  BP: (!) 130/58 128/64 127/62 (!) 163/91  Pulse: 62 64 66  62  Resp: 17 20 19    Temp: 98 F (36.7 C) 97.9 F (36.6 C) 98.5 F (36.9 C)   TempSrc: Oral Oral Oral   SpO2: 92% 94% 92%   Weight:      Height:        Intake/Output Summary (Last 24 hours) at 03/05/16 1310 Last data filed at 03/05/16 1222  Gross per 24 hour  Intake          3431.25 ml  Output             2950 ml  Net           481.25 ml   Filed Weights   03/04/16 0208  Weight: 90.7 kg (200 lb)    Examination:  General exam: lethargic, open eyes to voice, falls back sleep/  Respiratory system: Respiratory effort normal. Decrease breath sounds. Bilateral wheezing  Cardiovascular system: S1 & S2 heard, RRR. No JVD, murmurs, rubs, gallops or clicks. Trace  pedal edema. Gastrointestinal system: Abdomen is nondistended, soft and nontender. No organomegaly or masses felt. Normal bowel sounds heard. Central nervous system:lethargic Extremities: Symmetric 5 x 5 power. Skin: No rashes, lesions or ulcers     Data Reviewed: I have personally reviewed following labs and imaging studies  CBC:  Recent Labs Lab 03/03/16 2358 03/04/16 0008 03/04/16 0825 03/05/16 0538  WBC 11.5*  --  9.4 9.1  NEUTROABS 7.8*  --   --   --  HGB 6.8* 7.5* 7.4* 8.2*  HCT 22.2* 22.0* 23.1* 25.8*  MCV 82.2  --  82.8 83.5  PLT 324  --  284 [id number]   Basic Metabolic Panel:  Recent Labs Lab 03/04/16 0008 03/04/16 0825 03/05/16 0538  NA 136 136 138  K 3.5 3.4* 3.7  CL 92* 97* 103  CO2  --  31 29  GLUCOSE 111* 102* 92  BUN 34* 34* 18  CREATININE 1.30* 1.16 0.75  CALCIUM  --  8.6* 8.2*   GFR: Estimated Creatinine Clearance: 64.6 mL/min (by C-G formula based on SCr of 0.75 mg/dL). Liver Function Tests: No results for input(s): AST, ALT, ALKPHOS, BILITOT, PROT, ALBUMIN in the last 168 hours. No results for input(s): LIPASE, AMYLASE in the last 168 hours. No results for input(s): AMMONIA in the last 168 hours. Coagulation Profile: No results for input(s): INR, PROTIME in the last 168  hours. Cardiac Enzymes: No results for input(s): CKTOTAL, CKMB, CKMBINDEX, TROPONINI in the last 168 hours. BNP (last 3 results) No results for input(s): PROBNP in the last 8760 hours. HbA1C: No results for input(s): HGBA1C in the last 72 hours. CBG:  Recent Labs Lab 03/04/16 1959 03/04/16 2355 03/05/16 0405 03/05/16 0729 03/05/16 1232  GLUCAP 76 91 85 89 108*   Lipid Profile: No results for input(s): CHOL, HDL, LDLCALC, TRIG, CHOLHDL, LDLDIRECT in the last 72 hours. Thyroid Function Tests: No results for input(s): TSH, T4TOTAL, FREET4, T3FREE, THYROIDAB in the last 72 hours. Anemia Panel: No results for input(s): VITAMINB12, FOLATE, FERRITIN, TIBC, IRON, RETICCTPCT in the last 72 hours. Sepsis Labs: No results for input(s): PROCALCITON, LATICACIDVEN in the last 168 hours.  Recent Results (from the past 240 hour(s))  Urine culture     Status: Abnormal (Preliminary result)   Collection Time: 03/04/16 12:13 AM  Result Value Ref Range Status   Specimen Description URINE, CLEAN CATCH  Final   Special Requests NONE  Final   Culture >=100,000 COLONIES/mL GRAM NEGATIVE RODS (A)  Final   Report Status PENDING  Incomplete  MRSA PCR Screening     Status: None   Collection Time: 03/04/16  8:00 AM  Result Value Ref Range Status   MRSA by PCR NEGATIVE NEGATIVE Final    Comment:        The GeneXpert MRSA Assay (FDA approved for NASAL specimens only), is one component of a comprehensive MRSA colonization surveillance program. It is not intended to diagnose MRSA infection nor to guide or monitor treatment for MRSA infections.          Radiology Studies: Dg Chest 2 View  Result Date: 03/03/2016 CLINICAL DATA:  Shortness of breath and altered mental status. EXAM: CHEST  2 VIEW COMPARISON:  None. FINDINGS: The heart is enlarged with aortic tortuosity and atherosclerosis. Lungs appear hyperinflated. Ill-defined opacity posteriorly on the lateral view may localize to the medial  right lower lobe. No pulmonary edema or pleural fluid. No pneumothorax. Compression fractures in the mid and lower thoracic spine, lower compression fracture is chronic based on lumbar spine radiographs 03/11/2015. The bones are under mineralized. IMPRESSION: 1. Ill-defined retrocardiac opacity likely in the medial right lower lobe may be atelectasis or pneumonia. 2. Cardiomegaly with aortic tortuosity and atherosclerosis. 3. Compression fractures in the thoracic spine, at least 1 of which is chronic. Electronically Signed   By: [name] M.D.   On: 03/03/2016 23:36   Dg Abdomen 1 View  Result Date: 03/04/2016 CLINICAL DATA:  Acute onset of constipation.  Initial  encounter. EXAM: ABDOMEN - 1 VIEW COMPARISON:  Lumbar spine radiographs performed 03/11/2015 FINDINGS: The visualized bowel gas pattern is unremarkable. Scattered air and stool filled loops of colon are seen; no abnormal dilatation of small bowel loops is seen to suggest small bowel obstruction. No free intra-abdominal air is identified, though evaluation for free air is limited on a single supine view. The visualized osseous structures are within normal limits; the sacroiliac joints are unremarkable in appearance. The visualized lung bases are essentially clear. Diffuse calcification is seen along the abdominal aorta. The abdominal aorta is tortuous in appearance. IMPRESSION: 1. Unremarkable bowel gas pattern; no free intra-abdominal air seen. Moderate amount of stool noted in the colon. 2. Diffuse aortic atherosclerosis. The abdominal aorta is tortuous in appearance. Electronically Signed   By: Garald Balding M.D.   On: 03/04/2016 01:30   Ct Head Wo Contrast  Result Date: 03/04/2016 CLINICAL DATA:  Acute onset of altered mental status. Initial encounter. EXAM: CT HEAD WITHOUT CONTRAST TECHNIQUE: Contiguous axial images were obtained from the base of the skull through the vertex without intravenous contrast. COMPARISON:  CT of the head  performed 07/28/2014 FINDINGS: Brain: No evidence of acute infarction, hemorrhage, hydrocephalus, extra-axial collection or mass lesion/mass effect. Prominence of the ventricles and sulci reflects moderately severe cortical volume loss. Cerebellar atrophy is noted. Diffuse periventricular and subcortical white matter change likely reflects small vessel ischemic microangiopathy. Chronic ischemic change is seen at the external capsule bilaterally. The brainstem and fourth ventricle are within normal limits. The cerebral hemispheres demonstrate grossly normal gray-white differentiation. No mass effect or midline shift is seen. Vascular: No hyperdense vessel or unexpected calcification. Skull: There is no evidence of fracture; visualized osseous structures are unremarkable in appearance. Sinuses/Orbits: The orbits are within normal limits. The paranasal sinuses and mastoid air cells are well-aerated. Other: No significant soft tissue abnormalities are seen. IMPRESSION: 1. No acute intracranial pathology seen on CT. 2. Moderately severe cortical volume loss and diffuse small vessel ischemic microangiopathy. 3. Chronic ischemic change at the external capsule bilaterally. Electronically Signed   By: Garald Balding M.D.   On: 03/04/2016 01:32        Scheduled Meds: . bisacodyl  5 mg Oral Daily  . ceFEPime (MAXIPIME) IV  1 g Intravenous Q12H  . erythromycin   Both Eyes Q6H  . insulin aspart  0-9 Units Subcutaneous Q4H  . ipratropium-albuterol  3 mL Nebulization Q6H  . metoprolol succinate  12.5 mg Oral Daily  . pantoprazole (PROTONIX) IV  40 mg Intravenous Q12H  . polyethylene glycol  17 g Oral Daily  . vancomycin  1,000 mg Intravenous Q24H   Continuous Infusions:    LOS: 1 day    Time spent: 35 minutes.     Elmarie Shiley, MD [facility] Pager [phone]  If 7PM-7AM, please contact night-coverage [email or web address] Password [email or web address] 03/05/2016, 1:10 PM

## 2016-03-05 NOTE — Evaluation (Signed)
Occupational Therapy Evaluation Patient Details Name: Parker [name] MRN: [id number] DOB: January 24, 1923 Today's Date: 03/05/2016    History of Present Illness This 83 y.o. male admitted from [facility] [place] [facility]F with confusion.   CT of head neck negative.  Hbg 6.8.  Dx:  acute encephalopathy, GI bleed, possible PNA with UTI.  PMH includes:  Aortic stenosis, A-Fib, Edema bil. LEs, DM, Neurogenic bladder, LBP, prostate fx, h/o ORIF humerus due to fx.    Clinical Impression   Pt admitted with above. He demonstrates the below listed deficits and will benefit from continued OT to maximize safety and independence with BADLs.  Pt presents to OT with generalized weakness, decreased activity tolerance, impaired balance, decreased trunk control, weakness bil. UEs and LEs.  He currently requires max A - total A for all ADLs.  He was able to move to EOB sitting with max A and sat EOB x ~5 mins with min - min guard assist before fatiguing - DOE 3/4.  He was unable to stand with total A +1.   Pt reports he was living at ALF, required assist with ADLs and ambulated with supervision - min A using RW PTA.   He will need rehab at discharge.  IF ALF able to provide current level of care, and if pt prefers to return there, then recommend ALF with HHOT/PT.   IF they are not able to provide current level of care, he will need SNF.   Will follow acutely.       Follow Up Recommendations  SNF;Other (comment) (vs. ALF with increased assistance and HHOT )    [facility] bed;3 in 1 bedside commode    Recommendations for Other Services       Precautions / Restrictions Precautions Precautions: Fall Restrictions Weight Bearing Restrictions: No      Mobility Bed Mobility Overal bed mobility: Needs Assistance Bed Mobility: Rolling;Supine to Sit;Sit to Sidelying Rolling: Max assist   Supine to sit: Max assist   Sit to sidelying: Max assist General bed mobility comments: Pt requires assist to lift  and lower LEs.  He is able to assist with lifitng trunk from bed, but fatigues quickly and falls back toward bed requiring assist to recover   Transfers Overall transfer level: Needs assistance   Transfers: Sit to/from Stand Sit to Stand: Total assist (unable )         General transfer comment: Attempted to move sit to stand.  He was unable to do so with +1 assist     Balance Overall balance assessment: Needs assistance Sitting-balance support: Feet supported Sitting balance-Leahy Scale: Poor Sitting balance - Comments: Pt with posterior lean.  He initially required min A to maintain EOB balance, progressed to min guard assist x 5 mins before fatiguing.  DOE 3/4 with activity                                     ADL Overall ADL's : Needs assistance/impaired Eating/Feeding: Set up;Bed level   Grooming: Wash/dry hands;Wash/dry face;Oral care;Minimal assistance;Bed level   Upper Body Bathing: Maximal assistance;Sitting   Lower Body Bathing: Maximal assistance;Bed level   Upper Body Dressing : Maximal assistance;Sitting   Lower Body Dressing: Total assistance;Bed level   Toilet Transfer: Total assistance Toilet Transfer Details (indicate cue type and reason): unable  Toileting- Clothing Manipulation and Hygiene: Total assistance;Bed level Toileting - Clothing Manipulation Details (indicate cue type and  reason): has foley      Functional mobility during ADLs: Maximal assistance;Total assistance General ADL Comments: Pt fatigues quickly with activity      Vision Patient Visual Report: No change from baseline       Perception     Praxis      Pertinent Vitals/Pain Pain Assessment: No/denies pain     Hand Dominance Right   Extremity/Trunk Assessment Upper Extremity Assessment Upper Extremity Assessment: Generalized weakness;LUE deficits/detail LUE Deficits / Details: limited shoulder ROM    Lower Extremity Assessment Lower Extremity Assessment:  Defer to PT evaluation   Cervical / Trunk Assessment Cervical / Trunk Assessment: Kyphotic   Communication Communication Communication: No difficulties   Cognition Arousal/Alertness: Awake/alert Behavior During Therapy: WFL for tasks assessed/performed Overall Cognitive Status: No family/caregiver present to determine baseline cognitive functioning                 General Comments: Pt appropriate and appears to answer questions appropriately    General Comments       Exercises       Shoulder Instructions      Home Living Family/patient expects to be discharged to:: Assisted living                             Home Equipment: Walker - 2 wheels          Prior Functioning/Environment Level of Independence: Needs assistance  Gait / Transfers Assistance Needed: Pt reports he ambulated with assist with RW ADL's / Homemaking Assistance Needed: Pt reports he requires significant assist with bathing and dressing.  He was able to groom most of the time, and feed himself             OT Problem List: Decreased strength;Decreased range of motion;Decreased activity tolerance;Impaired balance (sitting and/or standing);Decreased safety awareness;Decreased knowledge of use of DME or AE;Cardiopulmonary status limiting activity;Obesity;Impaired UE functional use;Increased edema      OT Treatment/Interventions: Self-care/ADL training;Therapeutic exercise;Energy conservation;DME and/or AE instruction;Therapeutic activities;Patient/family education;Balance training    OT Goals(Current goals can be found in the care plan section) Acute Rehab OT Goals Patient Stated Goal: to get stronger  OT Goal Formulation: With patient Time For Goal Achievement: 03/19/16 Potential to Achieve Goals: Good ADL Goals Pt Will Perform Grooming: with supervision;sitting Pt Will Perform Upper Body Bathing: with min assist;sitting Pt Will Transfer to Toilet: with mod assist;squat pivot  transfer;bedside commode Pt Will Perform Toileting - Clothing Manipulation and hygiene: with max assist;sit to/from stand Pt/caregiver will Perform Home Exercise Program: Increased strength;Right Upper extremity;Left upper extremity;With minimal assist Additional ADL Goal #1: Pt will participate in 15 mins therapeutic activity with no more than 2 rest breaks   OT Frequency: Min 2X/week   Barriers to D/C: Decreased caregiver support  unsure if ALF can provide current level of care        Co-evaluation              End of Session Nurse Communication: Mobility status  Activity Tolerance: Patient limited by fatigue Patient left: in bed;with call bell/phone within reach;with bed alarm set  OT Visit Diagnosis: Muscle weakness (generalized) (M62.81)                ADL either performed or assessed with clinical judgement  Time: [id number] OT Time Calculation (min): 24 min Charges:  OT General Charges $OT Visit: 1 Procedure OT Evaluation $OT Eval Moderate Complexity: 1 Procedure OT Treatments $Therapeutic Activity:  8-22 mins G-Codes:     [facility], OTR/L [id number]   Lucille Passy M 03/05/2016, 5:20 PM

## 2016-03-06 LAB — BASIC METABOLIC PANEL
Anion gap: 5 (ref 5–15)
BUN: 11 mg/dL (ref 6–20)
CALCIUM: 8.5 mg/dL — AB (ref 8.9–10.3)
CHLORIDE: 102 mmol/L (ref 101–111)
CO2: 30 mmol/L (ref 22–32)
CREATININE: 0.77 mg/dL (ref 0.61–1.24)
Glucose, Bld: 95 mg/dL (ref 65–99)
Potassium: 4.2 mmol/L (ref 3.5–5.1)
SODIUM: 137 mmol/L (ref 135–145)

## 2016-03-06 LAB — GLUCOSE, CAPILLARY
GLUCOSE-CAPILLARY: 103 mg/dL — AB (ref 65–99)
GLUCOSE-CAPILLARY: 107 mg/dL — AB (ref 65–99)
GLUCOSE-CAPILLARY: 120 mg/dL — AB (ref 65–99)
GLUCOSE-CAPILLARY: 142 mg/dL — AB (ref 65–99)
GLUCOSE-CAPILLARY: 95 mg/dL (ref 65–99)
Glucose-Capillary: 99 mg/dL (ref 65–99)

## 2016-03-06 LAB — URINE CULTURE: Culture: >=100000 — AB

## 2016-03-06 LAB — CBC
HCT: 27.2 % — ABNORMAL LOW (ref 39.0–52.0)
Hemoglobin: 8.5 g/dL — ABNORMAL LOW (ref 13.0–17.0)
MCH: 26.3 pg (ref 26.0–34.0)
MCHC: 31.3 g/dL (ref 30.0–36.0)
MCV: 84.2 fL (ref 78.0–100.0)
PLATELETS: 322 10*3/uL (ref 150–400)
RBC: 3.23 MIL/uL — ABNORMAL LOW (ref 4.22–5.81)
RDW: 16.4 % — ABNORMAL HIGH (ref 11.5–15.5)
WBC: 10 10*3/uL (ref 4.0–10.5)

## 2016-03-06 MED ORDER — FLEET ENEMA 7-19 GM/118ML RE ENEM
1.0000 | ENEMA | Freq: Once | RECTAL | Status: AC
  Administered 2016-03-06: 1 via RECTAL
  Filled 2016-03-06: qty 1

## 2016-03-06 MED ORDER — ENSURE ENLIVE PO LIQD
237.0000 mL | Freq: Two times a day (BID) | ORAL | Status: DC
  Administered 2016-03-06 – 2016-03-09 (×6): 237 mL via ORAL

## 2016-03-06 MED ORDER — VITAMIN B-12 100 MCG PO TABS
100.0000 ug | ORAL_TABLET | Freq: Every day | ORAL | Status: DC
  Administered 2016-03-06 – 2016-03-09 (×4): 100 ug via ORAL
  Filled 2016-03-06 (×4): qty 1

## 2016-03-06 MED ORDER — BISACODYL 10 MG RE SUPP
10.0000 mg | Freq: Once | RECTAL | Status: AC
  Administered 2016-03-06: 10 mg via RECTAL
  Filled 2016-03-06: qty 1

## 2016-03-06 MED ORDER — FERROUS SULFATE 325 (65 FE) MG PO TABS
325.0000 mg | ORAL_TABLET | Freq: Two times a day (BID) | ORAL | Status: DC
  Administered 2016-03-06 – 2016-03-09 (×6): 325 mg via ORAL
  Filled 2016-03-06 (×6): qty 1

## 2016-03-06 NOTE — Evaluation (Signed)
Physical Therapy Evaluation Patient Details Name: Parker [name] MRN: [id number] DOB: 1923-05-08 Today's Date: 03/06/2016   History of Present Illness  This 83 y.o. male admitted from [facility] ALF with confusion.   CT of head neck negative.  Hbg 6.8.  Dx:  acute encephalopathy, GI bleed, possible PNA with UTI.  PMH includes:  Aortic stenosis, A-Fib, Edema bil. LEs, DM, Neurogenic bladder, LBP, prostate fx, h/o ORIF humerus due to fx.     Clinical Impression  Pt admitted with above diagnosis. Pt currently with functional limitations due to the deficits listed below (see PT Problem List).  Pt will benefit from skilled PT to increase their independence and safety with mobility to allow discharge to the venue listed below.  Pt extremely weak from very limited mobility.  Pt will need SNF rehab unless he significantly improves while in acute care. At this time, pt will need a lift for OOB and nursing aware, however he may be able to progress to use of the [place] soon.     Follow Up Recommendations SNF;Supervision for mobility/OOB    Equipment Recommendations  None recommended by PT    Recommendations for Other Services       Precautions / Restrictions Precautions Precautions: Fall Restrictions Weight Bearing Restrictions: No      Mobility  Bed Mobility Overal bed mobility: Needs Assistance Bed Mobility: Rolling;Sidelying to Sit;Sit to Supine Rolling: Max assist Sidelying to sit: Max assist;+2 for physical assistance   Sit to supine: Max assist;+2 for physical assistance   General bed mobility comments: Pt on bed pan upon arrival.  Rolled L with MAX A and cleaned pt from having a BM (he was unaware that he had one). Returned supine then rolled R and sidelying> sit with MAX of 2.  Pt sat EOB for ~ 5 minutes and SOB with o2 90%.  He was unable to scoot towards [facility] with posterior lean.  Transfers       Sit to Stand: Total assist         General transfer comment: Pt unable to  assist.  Ambulation/Gait                Stairs            Wheelchair Mobility    Modified Rankin (Stroke Patients Only)       Balance Overall balance assessment: Needs assistance Sitting-balance support: Bilateral upper extremity supported;Feet supported Sitting balance-Leahy Scale: Poor Sitting balance - Comments: Posterior lean and reports being fatigued from the rolling and sitting up.  SOB with o2 90% on RA.   Postural control: Posterior lean                                   Pertinent Vitals/Pain Pain Assessment: No/denies pain    Home Living Family/patient expects to be discharged to:: Assisted living               Home Equipment: Walker - 2 wheels      Prior Function Level of Independence: Needs assistance   Gait / Transfers Assistance Needed: Pt reports he ambulated with assist with RW. States he would ambulate to dining room, but not very active in apartment.  ADL's / Homemaking Assistance Needed: Pt reports he requires significant assist with bathing and dressing.  He was able to groom most of the time, and feed himself         Hand Dominance  Dominant Hand: Right    Extremity/Trunk Assessment   Upper Extremity Assessment Upper Extremity Assessment: Defer to OT evaluation    Lower Extremity Assessment Lower Extremity Assessment: Generalized weakness    Cervical / Trunk Assessment Cervical / Trunk Assessment: Kyphotic  Communication   Communication: No difficulties  Cognition Arousal/Alertness: Awake/alert Behavior During Therapy: WFL for tasks assessed/performed Overall Cognitive Status: Within Functional Limits for tasks assessed                      General Comments      Exercises     Assessment/Plan    PT Assessment Patient needs continued PT services  PT Problem List Decreased strength;Decreased activity tolerance;Decreased balance;Decreased mobility;Decreased knowledge of use of DME        PT Treatment Interventions DME instruction;Gait training;Functional mobility training;Therapeutic activities;Therapeutic exercise;Balance training    PT Goals (Current goals can be found in the Care Plan section)  Acute Rehab PT Goals Patient Stated Goal: to get stronger  PT Goal Formulation: With patient Time For Goal Achievement: 03/20/16 Potential to Achieve Goals: Good    Frequency Min 3X/week   Barriers to discharge        Co-evaluation               End of Session   Activity Tolerance: Patient limited by fatigue Patient left: in bed;with call bell/phone within reach;with bed alarm set;with nursing/sitter in room Nurse Communication: Mobility status;Need for lift equipment PT Visit Diagnosis: Muscle weakness (generalized) (M62.81)         Time: [id number] PT Time Calculation (min) (ACUTE ONLY): 21 min   Charges:   PT Evaluation $PT Eval Moderate Complexity: 1 Procedure     PT G Codes:         [name] 03/06/2016, 2:13 PM

## 2016-03-06 NOTE — Progress Notes (Signed)
PROGRESS NOTE    Parker [name]  [id number] DOB: 1923/08/04 DOA: 03/03/2016 PCP: Ileana Roup, MD    Brief Narrative: Parker [name] is a 83 y.o. male with atrial fibrillation, diabetes mellitus type 2, aortic stenosis, hyperlipidemia and possible CHF was brought to the ER after patient was found to be increasingly confused. Most of the history was obtained from ER physician as patient's family is not at the bedside and patient is confused. On exam patient is alert to his name and does not follow commands.   Assessment & Plan:   Active Problems:   Diabetes type 2, controlled ([place])   Aortic stenosis   Atrial fibrillation (HCC)   Acute GI bleeding   UTI (urinary tract infection)   Acute encephalopathy  Acute blood loss Anemia, occult blood positive.  Four month ago hb was at 11. On admission at 6. He has Received 2 unit PRBC.  Occult blood positive.  Stop anticoagulation.  GI consulted and following. No procedure for now.  Hb increased to 8, and has been stable.   PNA; continue with IV antibiotics.  Lungs clear today. Continue with nebulizer.   UTI; secondary to chronic foley catheter Foley catheter exchange 3-02.  Urine growing pseudomonas.  Continue with cefepime. Change to oral at discharge.   Acute encephalopathy; toxic secondary to infection.  He is more alert.  Start vitamin supplement.s  Check TSH   Hypokalemia; replace orally.   A fib; on metoprolol. Hold apixiban in setting of GI bleed. .   DM; hold metformin. SSI.   Constipation;  Will try enema/suppository.    DVT prophylaxis: scd.  Code Status: dnr Family Communication: son who was at bedside.  Disposition Plan: remain inpatient.  Consultants:   gi   Procedures: none   Antimicrobials:  Vancomycin 3-2 Cefepime 3-2  Subjective: He is more alert, open eyes, answer some questions. He denies abdominal pain.  No BM    Objective: Vitals:   03/05/16 2108 03/06/16 0527 03/06/16 0840  03/06/16 0921  BP: (!) 148/80 (!) 166/74  132/74  Pulse: 69 (!) 57  67  Resp: 18 16    Temp: 98.2 F (36.8 C) 98.4 F (36.9 C)    TempSrc: Axillary Oral    SpO2: 98% 95% 95%   Weight:      Height:        Intake/Output Summary (Last 24 hours) at 03/06/16 1313 Last data filed at 03/06/16 1200  Gross per 24 hour  Intake              540 ml  Output             2775 ml  Net            -2235 ml   Filed Weights   03/04/16 0208  Weight: 90.7 kg (200 lb)    Examination:  General exam: appears more alert.  Respiratory system: Respiratory effort normal. Bilateral air movement, no wheezing.  Cardiovascular system: S1 & S2 heard, RRR. No JVD, murmurs, rubs, gallops or clicks. Trace  pedal edema. Gastrointestinal system: Abdomen is nondistended, soft and nontender. No organomegaly or masses felt. Normal bowel sounds heard. Central nervous system:lethargic Extremities: Symmetric 5 x 5 power. Skin: No rashes, lesions or ulcers     Data Reviewed: I have personally reviewed following labs and imaging studies  CBC:  Recent Labs Lab 03/03/16 2358 03/04/16 0008 03/04/16 0825 03/05/16 0538 03/06/16 0553  WBC 11.5*  --  9.4 9.1 10.0  NEUTROABS  7.8*  --   --   --   --   HGB 6.8* 7.5* 7.4* 8.2* 8.5*  HCT 22.2* 22.0* 23.1* 25.8* 27.2*  MCV 82.2  --  82.8 83.5 84.2  PLT 324  --  284 297 [id number]   Basic Metabolic Panel:  Recent Labs Lab 03/04/16 0008 03/04/16 0825 03/05/16 0538 03/06/16 0553  NA 136 136 138 137  K 3.5 3.4* 3.7 4.2  CL 92* 97* 103 102  CO2  --  31 29 30   GLUCOSE 111* 102* 92 95  BUN 34* 34* 18 11  CREATININE 1.30* 1.16 0.75 0.77  CALCIUM  --  8.6* 8.2* 8.5*   GFR: Estimated Creatinine Clearance: 64.6 mL/min (by C-G formula based on SCr of 0.77 mg/dL). Liver Function Tests: No results for input(s): AST, ALT, ALKPHOS, BILITOT, PROT, ALBUMIN in the last 168 hours. No results for input(s): LIPASE, AMYLASE in the last 168 hours. No results for input(s):  AMMONIA in the last 168 hours. Coagulation Profile: No results for input(s): INR, PROTIME in the last 168 hours. Cardiac Enzymes: No results for input(s): CKTOTAL, CKMB, CKMBINDEX, TROPONINI in the last 168 hours. BNP (last 3 results) No results for input(s): PROBNP in the last 8760 hours. HbA1C: No results for input(s): HGBA1C in the last 72 hours. CBG:  Recent Labs Lab 03/05/16 2000 03/05/16 2352 03/06/16 0406 03/06/16 0742 03/06/16 1119  GLUCAP 110* 105* 95 99 107*   Lipid Profile: No results for input(s): CHOL, HDL, LDLCALC, TRIG, CHOLHDL, LDLDIRECT in the last 72 hours. Thyroid Function Tests: No results for input(s): TSH, T4TOTAL, FREET4, T3FREE, THYROIDAB in the last 72 hours. Anemia Panel: No results for input(s): VITAMINB12, FOLATE, FERRITIN, TIBC, IRON, RETICCTPCT in the last 72 hours. Sepsis Labs: No results for input(s): PROCALCITON, LATICACIDVEN in the last 168 hours.  Recent Results (from the past 240 hour(s))  Urine culture     Status: Abnormal   Collection Time: 03/04/16 12:13 AM  Result Value Ref Range Status   Specimen Description URINE, CLEAN CATCH  Final   Special Requests NONE  Final   Culture >=100,000 COLONIES/mL PSEUDOMONAS AERUGINOSA (A)  Final   Report Status 03/06/2016 FINAL  Final   Organism ID, Bacteria PSEUDOMONAS AERUGINOSA (A)  Final      Susceptibility   Pseudomonas aeruginosa - MIC*    CEFTAZIDIME 4 SENSITIVE Sensitive     CIPROFLOXACIN <=0.25 SENSITIVE Sensitive     GENTAMICIN <=1 SENSITIVE Sensitive     IMIPENEM 2 SENSITIVE Sensitive     PIP/TAZO 16 SENSITIVE Sensitive     CEFEPIME 8 SENSITIVE Sensitive     * >=100,000 COLONIES/mL PSEUDOMONAS AERUGINOSA  MRSA PCR Screening     Status: None   Collection Time: 03/04/16  8:00 AM  Result Value Ref Range Status   MRSA by PCR NEGATIVE NEGATIVE Final    Comment:        The GeneXpert MRSA Assay (FDA approved for NASAL specimens only), is one component of a comprehensive MRSA  colonization surveillance program. It is not intended to diagnose MRSA infection nor to guide or monitor treatment for MRSA infections.          Radiology Studies: No results found.      Scheduled Meds: . ceFEPime (MAXIPIME) IV  1 g Intravenous Q12H  . erythromycin   Both Eyes Q6H  . insulin aspart  0-9 Units Subcutaneous Q4H  . ipratropium-albuterol  3 mL Nebulization BID  . metoprolol succinate  12.5 mg Oral Daily  .  pantoprazole (PROTONIX) IV  40 mg Intravenous Q12H  . polyethylene glycol  17 g Oral BID  . sodium phosphate  1 enema Rectal Once   Continuous Infusions:    LOS: 2 days    Time spent: 35 minutes.     Elmarie Shiley, MD [facility] Pager [phone]  If 7PM-7AM, please contact night-coverage [email or web address] Password [email or web address] 03/06/2016, 1:13 PM

## 2016-03-06 NOTE — Progress Notes (Signed)
CROSS COVER [facility]-GI Subjective: Since I last evaluated the patient, he seems to be doing somewhat better today. He does not seem to be as confused as he was yesterday he's had some problems with constipation he denies having any abdominal pain. There is no history of overt GI bleeding.  Objective: Vital signs in last 24 hours: Temp:  [97.3 F (36.3 C)-98.4 F (36.9 C)] 98.4 F (36.9 C) (03/04 0527) Pulse Rate:  [57-89] 67 (03/04 0921) Resp:  [16-20] 16 (03/04 0527) BP: (132-166)/(73-80) 132/74 (03/04 0921) SpO2:  [95 %-100 %] 95 % (03/04 0840) Last BM Date: 03/04/16  Intake/Output from previous day: 03/03 0701 - 03/04 0700 In: 970 [P.O.:560; IV Piggyback:250] Out: 2775 [Urine:2775] Intake/Output this shift: Total I/O In: 240 [P.O.:240] Out: -   Resp: clear to auscultation bilaterally Cardio: regular rate and rhythm, S1, S2 normal, no murmur, click, rub or gallop GI: soft, non-tender; bowel sounds normal; no masses,  no organomegaly Extremities: extremities normal, atraumatic, no cyanosis or edema  Lab Results:  Recent Labs  03/04/16 0825 03/05/16 0538 03/06/16 0553  WBC 9.4 9.1 10.0  HGB 7.4* 8.2* 8.5*  HCT 23.1* 25.8* 27.2*  PLT 284 297 322   BMET  Recent Labs  03/04/16 0825 03/05/16 0538 03/06/16 0553  NA 136 138 137  K 3.4* 3.7 4.2  CL 97* 103 102  CO2 31 29 30   GLUCOSE 102* 92 95  BUN 34* 18 11  CREATININE 1.16 0.75 0.77  CALCIUM 8.6* 8.2* 8.5*   Medications: I have reviewed the patient's current medications.  Assessment/Plan: 1) Anemia with guaiac positive stools-GI workup not done because of the patient's altered mental status recent pneumonia and UTI. He's had a significant drop in his hemoglobin but due to his comorbidities workup has been held. The family seems to understand. His respiratory status seems to be improved today. If he continues to improve , endoscopic procedures may be considered in the near future.  2) Constipation-try Colace and  Miralax.  3)  Pneumonia-on IV antibiotics. 4) UTI due to chronic indwelling Foley on Cefepime.  5) History of atrial fibrillation and aortic stenosis-.his anticoagulation is on hold.    LOS: 2 days   Jamus Loving 03/06/2016, 11:24 AM

## 2016-03-07 ENCOUNTER — Inpatient Hospital Stay (HOSPITAL_COMMUNITY): Payer: Medicare HMO

## 2016-03-07 LAB — BASIC METABOLIC PANEL
Anion gap: 7 (ref 5–15)
BUN: 9 mg/dL (ref 6–20)
CO2: 28 mmol/L (ref 22–32)
Calcium: 8.4 mg/dL — ABNORMAL LOW (ref 8.9–10.3)
Chloride: 102 mmol/L (ref 101–111)
Creatinine, Ser: 0.64 mg/dL (ref 0.61–1.24)
GFR calc Af Amer: >60 mL/min (ref >60)
GLUCOSE: 95 mg/dL (ref 65–99)
POTASSIUM: 4 mmol/L (ref 3.5–5.1)
Sodium: 137 mmol/L (ref 135–145)

## 2016-03-07 LAB — GLUCOSE, CAPILLARY
GLUCOSE-CAPILLARY: 101 mg/dL — AB (ref 65–99)
GLUCOSE-CAPILLARY: 150 mg/dL — AB (ref 65–99)
GLUCOSE-CAPILLARY: 92 mg/dL (ref 65–99)
GLUCOSE-CAPILLARY: 112 mg/dL — AB (ref 65–99)
Glucose-Capillary: 86 mg/dL (ref 65–99)
Glucose-Capillary: 99 mg/dL (ref 65–99)

## 2016-03-07 LAB — CBC
HCT: 27.9 % — ABNORMAL LOW (ref 39.0–52.0)
HEMOGLOBIN: 8.9 g/dL — AB (ref 13.0–17.0)
MCH: 26.9 pg (ref 26.0–34.0)
MCHC: 31.9 g/dL (ref 30.0–36.0)
MCV: 84.3 fL (ref 78.0–100.0)
PLATELETS: 326 10*3/uL (ref 150–400)
RBC: 3.31 MIL/uL — AB (ref 4.22–5.81)
RDW: 16.8 % — ABNORMAL HIGH (ref 11.5–15.5)
WBC: 10.8 10*3/uL — AB (ref 4.0–10.5)

## 2016-03-07 NOTE — NC FL2 (Signed)
[place] MEDICAID FL2 LEVEL OF CARE SCREENING TOOL     IDENTIFICATION  Patient Name: Parker [name] Birthdate: 1923/06/04 Sex: male Admission Date (Current Location): 03/03/2016  [facility] and [place] Number:  Herbalist and Address:  [facility],  [place] [address] [place]      Provider Number: [id number]  Attending Physician Name and Address:  Elmarie Shiley, MD  Relative Name and Phone Number:       Current Level of Care: Hospital Recommended Level of Care: [place] Prior Approval Number:    Date Approved/Denied:   PASRR Number:    [id number]  Discharge Plan: SNF    Current Diagnoses: Patient Active Problem List   Diagnosis Date Noted  . Acute GI bleeding 03/04/2016  . UTI (urinary tract infection) 03/04/2016  . Acute encephalopathy 03/04/2016  . Aortic stenosis 11/17/2014  . Atrial fibrillation ([place]) 11/17/2014  . Other specified diabetes mellitus without complications [id number]  . Diabetes type 2, controlled ([place]) 10/28/2014  . Diabetes ([place]) 10/28/2014  . Hyperlipidemia 10/28/2014  . Accumulation of fluid in tissues 10/28/2014  . Herpes zoster without complication [id number]  . LBP (low back pain) 10/28/2014  . Combined fat and carbohydrate induced hyperlipemia 10/28/2014  . Type 2 diabetes mellitus without complications ([place]) [id number]  . Localized edema 09/01/2014  . Elevated brain natriuretic peptide (BNP) level 09/01/2014  . Fluid overload 09/01/2014    Orientation RESPIRATION BLADDER Height & Weight     Self, Situation, Place  Normal Indwelling catheter Weight: 200 lb (90.7 kg) Height:  5\' 7"  (170.2 cm)  BEHAVIORAL SYMPTOMS/MOOD NEUROLOGICAL BOWEL NUTRITION STATUS      Continent  (Soft Diet)  AMBULATORY STATUS COMMUNICATION OF NEEDS Skin   Extensive Assist Verbally Normal                       Personal Care Assistance Level of Assistance  Bathing, Feeding, Dressing Bathing Assistance:  Limited assistance Feeding assistance: Independent Dressing Assistance: Limited assistance     Functional Limitations Info  Sight, Hearing, Speech Sight Info: Adequate Hearing Info: Impaired Speech Info: Adequate    SPECIAL CARE FACTORS FREQUENCY  OT (By licensed OT), PT (By licensed PT)     PT Frequency: 5 OT Frequency: 5            Contractures Contractures Info: Not present    Additional Factors Info  Code Status, Allergies, Insulin Sliding Scale Code Status Info: DNR Allergies Info: No Known Allergies   Insulin Sliding Scale Info: insulin aspart (novoLOG) injection 0-9 Units       Current Medications (03/07/2016):  This is the current hospital active medication list Current Facility-Administered Medications  Medication Dose Route Frequency Provider Last Rate Last Dose  . acetaminophen (TYLENOL) tablet 650 mg  650 mg Oral Q6H PRN Rise Patience, MD       Or  . acetaminophen (TYLENOL) suppository 650 mg  650 mg Rectal Q6H PRN Rise Patience, MD      . ceFEPIme (MAXIPIME) 1 g in dextrose 5 % 50 mL IVPB  1 g Intravenous Q12H Adrian Saran, RPH   1 g at 03/06/16 2100  . erythromycin ophthalmic ointment   Both Eyes Q6H Rise Patience, MD      . feeding supplement (ENSURE ENLIVE) (ENSURE ENLIVE) liquid 237 mL  237 mL Oral BID BM Belkys A Regalado, MD   237 mL at 03/06/16 1443  . ferrous sulfate tablet 325  mg  325 mg Oral BID WC Belkys A Regalado, MD   325 mg at 03/07/16 0827  . insulin aspart (novoLOG) injection 0-9 Units  0-9 Units Subcutaneous Q4H Rise Patience, MD   1 Units at 03/06/16 2101  . ipratropium-albuterol (DUONEB) 0.5-2.5 (3) MG/3ML nebulizer solution 3 mL  3 mL Nebulization BID Belkys A Regalado, MD   3 mL at 03/06/16 1902  . metoprolol succinate (TOPROL-XL) 24 hr tablet 12.5 mg  12.5 mg Oral Daily Belkys A Regalado, MD   12.5 mg at 03/07/16 0827  . ondansetron (ZOFRAN) tablet 4 mg  4 mg Oral Q6H PRN Rise Patience, MD       Or  .  ondansetron [facility]) injection 4 mg  4 mg Intravenous Q6H PRN Rise Patience, MD      . pantoprazole (PROTONIX) injection 40 mg  40 mg Intravenous Q12H Willia Craze, NP   40 mg at 03/07/16 0827  . polyethylene glycol (MIRALAX / GLYCOLAX) packet 17 g  17 g Oral BID Belkys A Regalado, MD   17 g at 03/07/16 0827  . vitamin B-12 (CYANOCOBALAMIN) tablet 100 mcg  100 mcg Oral Daily Belkys A Regalado, MD   100 mcg at 03/06/16 1442     Discharge Medications: Please see discharge summary for a list of discharge medications.  Relevant Imaging Results:  Relevant Lab Results:   Additional Information SSN: [id number]  Lia Hopping, LCSW

## 2016-03-07 NOTE — Progress Notes (Signed)
Chaplain stopped in to see patient while rounding the floor.  Patient talks of being uncomfortable and wants to change the bed. He says the bed he has is hard.  When asked how he was doing, the patient said he was feeling okay, wasn't in any pain and had eaten all he could eat for now.  Patient smiled at me which was a good sign.  Son may be back later.  Chaplain made patient aware of [place] and told the patient we were available if he should want to chat.    03/07/16 1333  Clinical Encounter Type  Visited With Patient  Visit Type Initial   Dorna Bloom Resident

## 2016-03-07 NOTE — Progress Notes (Signed)
Patient ID: Parker [name], male   DOB: January 21, 1923, 83 y.o.   MRN: [id number]    Progress Note   Subjective   Pt resting, son at bedside Son reports pt not eating and a bit more confused today   Objective   Vital signs in last 24 hours: Temp:  [98.5 F (36.9 C)-99.7 F (37.6 C)] 98.5 F (36.9 C) (03/05 0531) Pulse Rate:  [65-81] 65 (03/05 0531) Resp:  [12-18] 16 (03/05 0531) BP: (127-134)/(61-73) 130/68 (03/05 0531) SpO2:  [90 %-95 %] 95 % (03/05 0531) Last BM Date: 03/07/16 General:    Elderly WM  in NAD sleeping- not further examined today  . Intake/Output from previous day: 03/04 0701 - 03/05 0700 In: 530 [P.O.:480; IV Piggyback:50] Out: 1200 [Urine:1200] Intake/Output this shift: Total I/O In: 60 [P.O.:60] Out: -   Lab Results:  Recent Labs  03/05/16 0538 03/06/16 0553 03/07/16 0547  WBC 9.1 10.0 10.8*  HGB 8.2* 8.5* 8.9*  HCT 25.8* 27.2* 27.9*  PLT 297 322 326   BMET  Recent Labs  03/05/16 0538 03/06/16 0553 03/07/16 0547  NA 138 137 137  K 3.7 4.2 4.0  CL 103 102 102  CO2 29 30 28   GLUCOSE 92 95 95  BUN 18 11 9   CREATININE 0.75 0.77 0.64  CALCIUM 8.2* 8.5* 8.4*   LFT No results for input(s): PROT, ALBUMIN, AST, ALT, ALKPHOS, BILITOT, BILIDIR, IBILI in the last 72 hours. PT/INR No results for input(s): LABPROT, INR in the last 72 hours.       Assessment / Plan:    #1 83 yo male with heme positive stool, and significant anemia (hgb 6.8) on admit in setting of Eliquis for atrial fib No previous hx of GI bleeding or issues Workup on hold due to other medical problems Eliquis on hold HGb stable post transfusions  #2 Pneumonia #3 Psudomonas UTI, chronic foley #4 atrial fib #5 AS #6 AMS secondary to acute illness #7 advanced age Parker [name]; Spoke with pt's son. Family not pushing for workup. He  Has not improved enough at this point to consider endoscopic eval . One approach would be to leave off Eliquis permanently, follow  Hgb's and not pursue endoscopic workup. We will check back later this week   Active Problems:   Diabetes type 2, controlled ([place])   Aortic stenosis   Atrial fibrillation (HCC)   Acute GI bleeding   UTI (urinary tract infection)   Acute encephalopathy     LOS: 3 days   Parker [name]  03/07/2016, 11:29 AM

## 2016-03-07 NOTE — Progress Notes (Signed)
Occupational Therapy Treatment Patient Details Name: Parker [name] MRN: [id number] DOB: Dec 04, 1923 Today's Date: 03/07/2016    History of present illness This 83 y.o. male admitted from [place] with confusion.   CT of head neck negative.  Hbg 6.8.  Dx:  acute encephalopathy, GI bleed, possible PNA with UTI.  PMH includes:  Aortic stenosis, A-Fib, Edema bil. LEs, DM, Neurogenic bladder, LBP, prostate fx, h/o ORIF humerus due to fx.       Follow Up Recommendations  SNF   Equipment Recommendations  Hospital bed;3 in 1 bedside commode    Recommendations for Other Services      Precautions / Restrictions Precautions Precautions: Fall Restrictions Weight Bearing Restrictions: No       Mobility Bed Mobility Overal bed mobility: Needs Assistance Bed Mobility: Rolling;Sidelying to Sit;Sit to Supine;Supine to Sit Rolling: Max assist Sidelying to sit: Max assist;+2 for physical assistance   Sit to supine: Max assist;+2 for physical assistance Sit to sidelying: Max assist    Transfers       Sit to Stand: Total assist         General transfer comment: Pt unable to assist.        ADL Overall ADL's : Needs assistance/impaired     Grooming: Sitting;Wash/dry face;Moderate assistance Grooming Details (indicate cue type and reason): mod A with sitting balance                     Toileting- Clothing Manipulation and Hygiene: Total assistance;Bed level Toileting - Clothing Manipulation Details (indicate cue type and reason): rolling L and R for clean up       General ADL Comments: Pt agreed to sitting EOB.  Pt fatigued quickly and also has BM accident in bed. OT called RN And Aed in cleaning up pt                        Pertinent Vitals/ Pain       Pain Assessment: No/denies pain     Prior Functioning/Environment              Frequency  Min 2X/week        Progress Toward Goals  OT Goals(current goals can now be found in the care  plan section)  Progress towards OT goals: Progressing toward goals     Plan Discharge plan remains appropriate    nd of Session    OT Visit Diagnosis: Muscle weakness (generalized) (M62.81)   Activity Tolerance Patient limited by fatigue   Patient Left in bed;with call bell/phone within reach;with bed alarm set   Nurse Communication Mobility status        Time: [id number] OT Time Calculation (min): 18 min  Charges: OT Evaluation $OT Eval Low Complexity: 1 Procedure OT Treatments $Self Care/Home Management : 8-22 mins  [place], [place]   Payton Mccallum D 03/07/2016, 1:10 PM

## 2016-03-07 NOTE — Progress Notes (Signed)
PROGRESS NOTE    Parker [name]  [id number] DOB: [birth year] DOA: 03/03/2016 PCP: Ileana Roup, MD    Brief Narrative: Parker [name] is a 83 y.o. male with atrial fibrillation, diabetes mellitus type 2, aortic stenosis, hyperlipidemia and possible CHF was brought to the ER after patient was found to be increasingly confused. Most of the history was obtained from ER physician as patient's family is not at the bedside and patient is confused. On exam patient is alert to his name and does not follow commands.   Assessment & Plan:   Active Problems:   Diabetes type 2, controlled ([place])   Aortic stenosis   Atrial fibrillation (HCC)   Acute GI bleeding   UTI (urinary tract infection)   Acute encephalopathy  Acute blood loss Anemia, occult blood positive.  Four month ago hb was at 11. On admission at 6. He has Received 2 unit PRBC.  Occult blood positive.  Stop anticoagulation.  GI consulted and following. No procedure for now.  Hb increased to 8, and has been stable.   PNA; continue with IV antibiotics.  Continue with nebulizer.  Chest x ray with slightly worse infiltrates. Continue with IV antibiotics. Repeat cbc in am.  Incentive spirometry.   UTI; secondary to chronic foley catheter Foley catheter exchange 3-02.  Urine growing pseudomonas.  Continue with cefepime. Change to oral at discharge.   Acute encephalopathy; toxic secondary to infection.  He is more alert.  Start vitamin supplement.s  Check TSH   Hypokalemia; replace orally.   A fib; on metoprolol. Hold apixiban in setting of GI bleed. .   DM; hold metformin. SSI.   Constipation;  Had multiples BM.    DVT prophylaxis: scd.  Code Status: dnr Family Communication: son who was at bedside.  Disposition Plan: remain inpatient.  Consultants:   gi   Procedures: none   Antimicrobials:  Vancomycin 3-2 Cefepime 3-2  Subjective: More alert, son report patient has been sleepy almost 80 % of the day.    He has been coughing some.  Patient had BM     Objective: Vitals:   03/06/16 1902 03/06/16 2010 03/07/16 0531 03/07/16 1409  BP:  127/61 130/68 113/63  Pulse:  81 65 67  Resp:  18 16 14   Temp:  99.7 F (37.6 C) 98.5 F (36.9 C) 98.4 F (36.9 C)  TempSrc:  Oral Oral Oral  SpO2: 92% 94% 95% 93%  Weight:      Height:        Intake/Output Summary (Last 24 hours) at 03/07/16 1518 Last data filed at 03/07/16 1407  Gross per 24 hour  Intake              290 ml  Output             1150 ml  Net             -860 ml   Filed Weights   03/04/16 0208  Weight: 90.7 kg (200 lb)    Examination:  General exam: appears more alert.  Respiratory system: Respiratory effort normal. Bilateral air movement, no wheezing.  Cardiovascular system: S1 & S2 heard, RRR. No JVD, murmurs, rubs, gallops or clicks. Trace  pedal edema. Gastrointestinal system: Abdomen is nondistended, soft and nontender. No organomegaly or masses felt. Normal bowel sounds heard. Central nervous system:lethargic Extremities: Symmetric 5 x 5 power. Skin: No rashes, lesions or ulcers     Data Reviewed: I have personally reviewed following labs and imaging studies  CBC:  Recent Labs Lab 03/03/16 2358 03/04/16 0008 03/04/16 0825 03/05/16 0538 03/06/16 0553 03/07/16 0547  WBC 11.5*  --  9.4 9.1 10.0 10.8*  NEUTROABS 7.8*  --   --   --   --   --   HGB 6.8* 7.5* 7.4* 8.2* 8.5* 8.9*  HCT 22.2* 22.0* 23.1* 25.8* 27.2* 27.9*  MCV 82.2  --  82.8 83.5 84.2 84.3  PLT 324  --  284 297 322 [id number]   Basic Metabolic Panel:  Recent Labs Lab 03/04/16 0008 03/04/16 0825 03/05/16 0538 03/06/16 0553 03/07/16 0547  NA 136 136 138 137 137  K 3.5 3.4* 3.7 4.2 4.0  CL 92* 97* 103 102 102  CO2  --  31 29 30 28   GLUCOSE 111* 102* 92 95 95  BUN 34* 34* 18 11 9   CREATININE 1.30* 1.16 0.75 0.77 0.64  CALCIUM  --  8.6* 8.2* 8.5* 8.4*   GFR: Estimated Creatinine Clearance: 64.6 mL/min (by C-G formula based on SCr of 0.64  mg/dL). Liver Function Tests: No results for input(s): AST, ALT, ALKPHOS, BILITOT, PROT, ALBUMIN in the last 168 hours. No results for input(s): LIPASE, AMYLASE in the last 168 hours. No results for input(s): AMMONIA in the last 168 hours. Coagulation Profile: No results for input(s): INR, PROTIME in the last 168 hours. Cardiac Enzymes: No results for input(s): CKTOTAL, CKMB, CKMBINDEX, TROPONINI in the last 168 hours. BNP (last 3 results) No results for input(s): PROBNP in the last 8760 hours. HbA1C: No results for input(s): HGBA1C in the last 72 hours. CBG:  Recent Labs Lab 03/06/16 2014 03/06/16 2331 03/07/16 0340 03/07/16 0737 03/07/16 1146  GLUCAP 142* 120* 101* 86 150*   Lipid Profile: No results for input(s): CHOL, HDL, LDLCALC, TRIG, CHOLHDL, LDLDIRECT in the last 72 hours. Thyroid Function Tests: No results for input(s): TSH, T4TOTAL, FREET4, T3FREE, THYROIDAB in the last 72 hours. Anemia Panel: No results for input(s): VITAMINB12, FOLATE, FERRITIN, TIBC, IRON, RETICCTPCT in the last 72 hours. Sepsis Labs: No results for input(s): PROCALCITON, LATICACIDVEN in the last 168 hours.  Recent Results (from the past 240 hour(s))  Urine culture     Status: Abnormal   Collection Time: 03/04/16 12:13 AM  Result Value Ref Range Status   Specimen Description URINE, CLEAN CATCH  Final   Special Requests NONE  Final   Culture >=100,000 COLONIES/mL PSEUDOMONAS AERUGINOSA (A)  Final   Report Status 03/06/2016 FINAL  Final   Organism ID, Bacteria PSEUDOMONAS AERUGINOSA (A)  Final      Susceptibility   Pseudomonas aeruginosa - MIC*    CEFTAZIDIME 4 SENSITIVE Sensitive     CIPROFLOXACIN <=0.25 SENSITIVE Sensitive     GENTAMICIN <=1 SENSITIVE Sensitive     IMIPENEM 2 SENSITIVE Sensitive     PIP/TAZO 16 SENSITIVE Sensitive     CEFEPIME 8 SENSITIVE Sensitive     * >=100,000 COLONIES/mL PSEUDOMONAS AERUGINOSA  MRSA PCR Screening     Status: None   Collection Time: 03/04/16  8:00  AM  Result Value Ref Range Status   MRSA by PCR NEGATIVE NEGATIVE Final    Comment:        The GeneXpert MRSA Assay (FDA approved for NASAL specimens only), is one component of a comprehensive MRSA colonization surveillance program. It is not intended to diagnose MRSA infection nor to guide or monitor treatment for MRSA infections.          Radiology Studies: Dg Chest Port 1 View  Result Date:  03/07/2016 CLINICAL DATA:  Cough, pneumonia, aortic stenosis EXAM: PORTABLE CHEST 1 VIEW COMPARISON:  03/03/2016 FINDINGS: Cardiomediastinal silhouette is stable. Slight worsening infiltrate/pneumonia medial aspect of the right lower lobe. Probable small right pleural effusion. No pulmonary edema. IMPRESSION: Slight worsening infiltrate/pneumonia medial aspect of the right lower lobe. Probable small right pleural effusion. No pulmonary edema. Electronically Signed   By: Lahoma Crocker M.D.   On: 03/07/2016 13:02        Scheduled Meds: . ceFEPime (MAXIPIME) IV  1 g Intravenous Q12H  . erythromycin   Both Eyes Q6H  . feeding supplement (ENSURE ENLIVE)  237 mL Oral BID BM  . ferrous sulfate  325 mg Oral BID WC  . insulin aspart  0-9 Units Subcutaneous Q4H  . ipratropium-albuterol  3 mL Nebulization BID  . metoprolol succinate  12.5 mg Oral Daily  . pantoprazole (PROTONIX) IV  40 mg Intravenous Q12H  . polyethylene glycol  17 g Oral BID  . vitamin B-12  100 mcg Oral Daily   Continuous Infusions:    LOS: 3 days    Time spent: 35 minutes.     Elmarie Shiley, MD [facility] Pager [phone]  If 7PM-7AM, please contact night-coverage [email or web address] Password [facility] 03/07/2016, 3:18 PM

## 2016-03-07 NOTE — Progress Notes (Signed)
    I have reviewed the case and spoken to Dr. Tyrell Antonio and Brooke Dare ( patients son)  I agree that holding the Eliquis is indicated. He was admitted with a GI bleed and anemia  He has pneumonia.  Will likely to go SNF from here.      Mertie Moores, MD  03/07/2016 12:53 PM    [place],  [place], [place]  [address] Pager [phone] Phone: [phone]; Fax: [phone]

## 2016-03-07 NOTE — Progress Notes (Signed)
Patient and son have accepted bed offer at [facility] for rehab. SNF  has started insurance authorization for patient.

## 2016-03-07 NOTE — Progress Notes (Signed)
Pharmacy Antibiotic Note  Parker [name] is a 83 y.o. male admitted on 03/03/2016 with pneumonia and UTI.  Pharmacy was been consulted for vancomycin and cefepime dosing.  Regimen was de-escalated on 3/3 by discontinuing vancomycin.  Urine culture grew Pseudomonas aeruginosa, S to cefepime, ciprofloxacin.   Also on ferrous sulfate 325 mg BID, which interferes with absorption of oral ciprofloxacin.  (Oral ciprofloxacin should be taken at least 2 hours before, at least 6 hours after iron)  Recommend: When patient is ready for conversion to oral therapy, consider: Cipro 250 mg PO q12h 8a - 8p for treatment of UTI Vantin 200 mg PO q12h for treatment of PNA Switch iron product to Niferex (iron polysaccharide) 150 mg PO daily at 12 noon.   Height: 5\' 7"  (170.2 cm) Weight: 200 lb (90.7 kg) IBW/kg (Calculated) : 66.1  Temp (24hrs), Avg:99 F (37.2 C), Min:98.5 F (36.9 C), Max:99.7 F (37.6 C)   Recent Labs Lab 03/03/16 2358 03/04/16 0008 03/04/16 0825 03/05/16 0538 03/06/16 0553 03/07/16 0547  WBC 11.5*  --  9.4 9.1 10.0 10.8*  CREATININE  --  1.30* 1.16 0.75 0.77 0.64    Estimated Creatinine Clearance: 64.6 mL/min (by C-G formula based on SCr of 0.64 mg/dL).    No Known Allergies  Thank you for allowing pharmacy to be a part of this patient's care.  Clayburn Pert, PharmD, BCPS Pager: [phone] 03/07/2016  10:21 AM

## 2016-03-08 LAB — CBC
HEMATOCRIT: 28.9 % — AB (ref 39.0–52.0)
Hemoglobin: 9 g/dL — ABNORMAL LOW (ref 13.0–17.0)
MCH: 26.5 pg (ref 26.0–34.0)
MCHC: 31.1 g/dL (ref 30.0–36.0)
MCV: 85 fL (ref 78.0–100.0)
PLATELETS: 335 10*3/uL (ref 150–400)
RBC: 3.4 MIL/uL — AB (ref 4.22–5.81)
RDW: 17.3 % — ABNORMAL HIGH (ref 11.5–15.5)
WBC: 9.7 10*3/uL (ref 4.0–10.5)

## 2016-03-08 LAB — GLUCOSE, CAPILLARY
GLUCOSE-CAPILLARY: 89 mg/dL (ref 65–99)
Glucose-Capillary: 86 mg/dL (ref 65–99)
Glucose-Capillary: 128 mg/dL — ABNORMAL HIGH (ref 65–99)
Glucose-Capillary: 105 mg/dL — ABNORMAL HIGH (ref 65–99)
Glucose-Capillary: 116 mg/dL — ABNORMAL HIGH (ref 65–99)

## 2016-03-08 LAB — TSH: TSH: 4.54 u[IU]/mL — ABNORMAL HIGH (ref 0.350–4.500)

## 2016-03-08 NOTE — Care Management Note (Signed)
Case Management Note  Patient Details  Name: Parker [name] MRN: [id number] Date of Birth: 05-26-1923  Subjective/Objective:                  GI bleed and anemia Action/Plan: Discharge planning Expected Discharge Date:   (UNKNOWN)               Expected Discharge Plan:  [name]  In-House Referral:     Discharge planning Services  CM Consult  Post Acute Care Choice:  NA Choice offered to:  NA  DME Arranged:  N/A DME Agency:     HH Arranged:  NA HH Agency:  NA  Status of Service:  Completed, signed off  If discussed at [place] of Stay Meetings, dates discussed:    Additional Comments: Cm notes pt to go to SNF; CSW aware and arranging. NO Cm needs communicated. Dellie Catholic, RN 03/08/2016, 12:05 PM

## 2016-03-08 NOTE — Progress Notes (Signed)
PROGRESS NOTE    Parker [name]  [id number] DOB: 1923/07/14 DOA: 03/03/2016 PCP: Ileana Roup, MD    Brief Narrative: Parker [name] is a 83 y.o. male with atrial fibrillation, diabetes mellitus type 2, aortic stenosis, hyperlipidemia and possible CHF was brought to the ER after patient was found to be increasingly confused. Most of the history was obtained from ER physician as patient's family is not at the bedside and patient is confused. On exam patient is alert to his name and does not follow commands.   Assessment & Plan:   Active Problems:   Diabetes type 2, controlled ([place])   Aortic stenosis   Atrial fibrillation (HCC)   Acute GI bleeding   UTI (urinary tract infection)   Acute encephalopathy  Acute blood loss Anemia, occult blood positive.  Four month ago hb was at 11. On admission at 6. He has Received 2 unit PRBC.  Occult blood positive. Stop anticoagulation.  GI consulted and following. No procedure for now.  Hb increased to 9, and has been stable.   PNA; Continue with IV antibiotics. Cefepime day 4.  Continue with nebulizer.  Chest x ray 3-05  with worse infiltrates. Continue with IV antibiotics.  Incentive spirometry.  WBC normalized.  Will continue with IV antibiotics, repeat chest x ray 3-07, if infiltrates stable patient could be discharge to rehab on oral antibiotics.  Would use Levaquin to cover both PNA and Pseudomonas UTI.   UTI; Secondary to chronic foley catheter Foley catheter exchange 3-02.  Urine grew pseudomonas.  Continue with cefepime. Change to oral at discharge. Sensitive to cipro, would use Levaquin to cover for PNA>    Acute encephalopathy; toxic secondary to infection.  He is more alert.  Start vitamin supplement.s   TSH midly elevated.   Hypokalemia; replace orally.   A fib; on metoprolol. Hold apixiban in setting of GI bleed. .   DM; hold metformin. SSI.   Constipation;  Had multiples BM.    DVT prophylaxis: scd.  Code  Status: dnr Family Communication: Son updated 3-05. Will try to call him today  Disposition Plan: remain inpatient.  Consultants:   gi   Procedures: none   Antimicrobials:  Vancomycin 3-2 Cefepime 3-2  Subjective: Patient when I arrive to room , he was watching TV, this is an improvement.  Per nurse he has been more alert.  He denies worsening dyspnea.    Objective: Vitals:   03/07/16 2031 03/08/16 0449 03/08/16 0901 03/08/16 1407  BP: 135/71 (!) 120/59  130/76  Pulse: 68 65  69  Resp: 16   18  Temp: 98.6 F (37 C) 98.8 F (37.1 C)  98.6 F (37 C)  TempSrc: Oral Oral  Oral  SpO2: 95% 90% 94% 95%  Weight:      Height:        Intake/Output Summary (Last 24 hours) at 03/08/16 1442 Last data filed at 03/08/16 1157  Gross per 24 hour  Intake              500 ml  Output              625 ml  Net             -125 ml   Filed Weights   03/04/16 0208  Weight: 90.7 kg (200 lb)    Examination:  General exam: appears more alert.  Respiratory system: Respiratory effort normal. Bilateral air movement, no wheezing.  Cardiovascular system: S1 & S2 heard, RRR. No  JVD, murmurs, rubs, gallops or clicks. Trace  pedal edema. Gastrointestinal system: Abdomen is nondistended, soft and nontender. No organomegaly or masses felt. Normal bowel sounds heard. Central nervous system: Alert, in no distress.  Extremities: Symmetric 5 x 5 power. Skin: No rashes, lesions or ulcers     Data Reviewed: I have personally reviewed following labs and imaging studies  CBC:  Recent Labs Lab 03/03/16 2358  03/04/16 0825 03/05/16 0538 03/06/16 0553 03/07/16 0547 03/08/16 0646  WBC 11.5*  --  9.4 9.1 10.0 10.8* 9.7  NEUTROABS 7.8*  --   --   --   --   --   --   HGB 6.8*  < > 7.4* 8.2* 8.5* 8.9* 9.0*  HCT 22.2*  < > 23.1* 25.8* 27.2* 27.9* 28.9*  MCV 82.2  --  82.8 83.5 84.2 84.3 85.0  PLT 324  --  284 297 322 326 335  < > = values in this interval not displayed. Basic Metabolic  Panel:  Recent Labs Lab 03/04/16 0008 03/04/16 0825 03/05/16 0538 03/06/16 0553 03/07/16 0547  NA 136 136 138 137 137  K 3.5 3.4* 3.7 4.2 4.0  CL 92* 97* 103 102 102  CO2  --  31 29 30 28   GLUCOSE 111* 102* 92 95 95  BUN 34* 34* 18 11 9   CREATININE 1.30* 1.16 0.75 0.77 0.64  CALCIUM  --  8.6* 8.2* 8.5* 8.4*   GFR: Estimated Creatinine Clearance: 64.6 mL/min (by C-G formula based on SCr of 0.64 mg/dL). Liver Function Tests: No results for input(s): AST, ALT, ALKPHOS, BILITOT, PROT, ALBUMIN in the last 168 hours. No results for input(s): LIPASE, AMYLASE in the last 168 hours. No results for input(s): AMMONIA in the last 168 hours. Coagulation Profile: No results for input(s): INR, PROTIME in the last 168 hours. Cardiac Enzymes: No results for input(s): CKTOTAL, CKMB, CKMBINDEX, TROPONINI in the last 168 hours. BNP (last 3 results) No results for input(s): PROBNP in the last 8760 hours. HbA1C: No results for input(s): HGBA1C in the last 72 hours. CBG:  Recent Labs Lab 03/07/16 2029 03/07/16 2350 03/08/16 0448 03/08/16 0715 03/08/16 1125  GLUCAP 112* 99 86 89 128*   Lipid Profile: No results for input(s): CHOL, HDL, LDLCALC, TRIG, CHOLHDL, LDLDIRECT in the last 72 hours. Thyroid Function Tests:  Recent Labs  03/08/16 0646  TSH 4.540*   Anemia Panel: No results for input(s): VITAMINB12, FOLATE, FERRITIN, TIBC, IRON, RETICCTPCT in the last 72 hours. Sepsis Labs: No results for input(s): PROCALCITON, LATICACIDVEN in the last 168 hours.  Recent Results (from the past 240 hour(s))  Urine culture     Status: Abnormal   Collection Time: 03/04/16 12:13 AM  Result Value Ref Range Status   Specimen Description URINE, CLEAN CATCH  Final   Special Requests NONE  Final   Culture >=100,000 COLONIES/mL PSEUDOMONAS AERUGINOSA (A)  Final   Report Status 03/06/2016 FINAL  Final   Organism ID, Bacteria PSEUDOMONAS AERUGINOSA (A)  Final      Susceptibility   Pseudomonas  aeruginosa - MIC*    CEFTAZIDIME 4 SENSITIVE Sensitive     CIPROFLOXACIN <=0.25 SENSITIVE Sensitive     GENTAMICIN <=1 SENSITIVE Sensitive     IMIPENEM 2 SENSITIVE Sensitive     PIP/TAZO 16 SENSITIVE Sensitive     CEFEPIME 8 SENSITIVE Sensitive     * >=100,000 COLONIES/mL PSEUDOMONAS AERUGINOSA  MRSA PCR Screening     Status: None   Collection Time: 03/04/16  8:00 AM  Result Value Ref Range Status   MRSA by PCR NEGATIVE NEGATIVE Final    Comment:        The GeneXpert MRSA Assay (FDA approved for NASAL specimens only), is one component of a comprehensive MRSA colonization surveillance program. It is not intended to diagnose MRSA infection nor to guide or monitor treatment for MRSA infections.          Radiology Studies: Dg Chest Port 1 View  Result Date: 03/07/2016 CLINICAL DATA:  Cough, pneumonia, aortic stenosis EXAM: PORTABLE CHEST 1 VIEW COMPARISON:  03/03/2016 FINDINGS: Cardiomediastinal silhouette is stable. Slight worsening infiltrate/pneumonia medial aspect of the right lower lobe. Probable small right pleural effusion. No pulmonary edema. IMPRESSION: Slight worsening infiltrate/pneumonia medial aspect of the right lower lobe. Probable small right pleural effusion. No pulmonary edema. Electronically Signed   By: Lahoma Crocker M.D.   On: 03/07/2016 13:02        Scheduled Meds: . ceFEPime (MAXIPIME) IV  1 g Intravenous Q12H  . erythromycin   Both Eyes Q6H  . feeding supplement (ENSURE ENLIVE)  237 mL Oral BID BM  . ferrous sulfate  325 mg Oral BID WC  . insulin aspart  0-9 Units Subcutaneous Q4H  . ipratropium-albuterol  3 mL Nebulization BID  . metoprolol succinate  12.5 mg Oral Daily  . pantoprazole (PROTONIX) IV  40 mg Intravenous Q12H  . polyethylene glycol  17 g Oral BID  . vitamin B-12  100 mcg Oral Daily   Continuous Infusions:    LOS: 4 days    Time spent: 35 minutes.     Elmarie Shiley, MD [facility] Pager [phone]  If  7PM-7AM, please contact night-coverage [email or web address] Password [email or web address] 03/08/2016, 2:42 PM

## 2016-03-08 NOTE — Progress Notes (Addendum)
LCSWA informed patient son about admission paperwork, designated times to fill out and  provided Admission coordinator number to discus appropriate time.  Informed facility that patient is not medically stable for discharge.

## 2016-03-09 ENCOUNTER — Inpatient Hospital Stay (HOSPITAL_COMMUNITY): Payer: Medicare HMO

## 2016-03-09 DIAGNOSIS — E785 Hyperlipidemia, unspecified: Secondary | ICD-10-CM | POA: Diagnosis not present

## 2016-03-09 DIAGNOSIS — C61 Malignant neoplasm of prostate: Secondary | ICD-10-CM | POA: Diagnosis not present

## 2016-03-09 DIAGNOSIS — J189 Pneumonia, unspecified organism: Secondary | ICD-10-CM | POA: Diagnosis not present

## 2016-03-09 DIAGNOSIS — K922 Gastrointestinal hemorrhage, unspecified: Secondary | ICD-10-CM

## 2016-03-09 DIAGNOSIS — I350 Nonrheumatic aortic (valve) stenosis: Secondary | ICD-10-CM | POA: Diagnosis not present

## 2016-03-09 DIAGNOSIS — E118 Type 2 diabetes mellitus with unspecified complications: Secondary | ICD-10-CM | POA: Diagnosis not present

## 2016-03-09 DIAGNOSIS — I482 Chronic atrial fibrillation: Secondary | ICD-10-CM | POA: Diagnosis not present

## 2016-03-09 DIAGNOSIS — R2689 Other abnormalities of gait and mobility: Secondary | ICD-10-CM | POA: Diagnosis not present

## 2016-03-09 DIAGNOSIS — G9340 Encephalopathy, unspecified: Secondary | ICD-10-CM

## 2016-03-09 DIAGNOSIS — I4891 Unspecified atrial fibrillation: Secondary | ICD-10-CM | POA: Diagnosis not present

## 2016-03-09 DIAGNOSIS — R531 Weakness: Secondary | ICD-10-CM | POA: Diagnosis not present

## 2016-03-09 DIAGNOSIS — K219 Gastro-esophageal reflux disease without esophagitis: Secondary | ICD-10-CM | POA: Diagnosis not present

## 2016-03-09 DIAGNOSIS — D649 Anemia, unspecified: Secondary | ICD-10-CM

## 2016-03-09 DIAGNOSIS — R278 Other lack of coordination: Secondary | ICD-10-CM | POA: Diagnosis not present

## 2016-03-09 DIAGNOSIS — R05 Cough: Secondary | ICD-10-CM | POA: Diagnosis not present

## 2016-03-09 DIAGNOSIS — D631 Anemia in chronic kidney disease: Secondary | ICD-10-CM | POA: Diagnosis not present

## 2016-03-09 DIAGNOSIS — N390 Urinary tract infection, site not specified: Secondary | ICD-10-CM

## 2016-03-09 DIAGNOSIS — E119 Type 2 diabetes mellitus without complications: Secondary | ICD-10-CM | POA: Diagnosis not present

## 2016-03-09 DIAGNOSIS — Z66 Do not resuscitate: Secondary | ICD-10-CM | POA: Diagnosis not present

## 2016-03-09 LAB — BASIC METABOLIC PANEL
Anion gap: 6 (ref 5–15)
BUN: 10 mg/dL (ref 6–20)
CALCIUM: 8.5 mg/dL — AB (ref 8.9–10.3)
CO2: 27 mmol/L (ref 22–32)
CREATININE: 0.65 mg/dL (ref 0.61–1.24)
Chloride: 104 mmol/L (ref 101–111)
GFR calc Af Amer: >60 mL/min (ref >60)
GLUCOSE: 95 mg/dL (ref 65–99)
Potassium: 4 mmol/L (ref 3.5–5.1)
Sodium: 137 mmol/L (ref 135–145)

## 2016-03-09 LAB — CBC
HCT: 28.8 % — ABNORMAL LOW (ref 39.0–52.0)
HEMOGLOBIN: 8.7 g/dL — AB (ref 13.0–17.0)
MCH: 25.4 pg — ABNORMAL LOW (ref 26.0–34.0)
MCHC: 30.2 g/dL (ref 30.0–36.0)
MCV: 84.2 fL (ref 78.0–100.0)
PLATELETS: 323 10*3/uL (ref 150–400)
RBC: 3.42 MIL/uL — ABNORMAL LOW (ref 4.22–5.81)
RDW: 17.1 % — AB (ref 11.5–15.5)
WBC: 9.7 10*3/uL (ref 4.0–10.5)

## 2016-03-09 LAB — GLUCOSE, CAPILLARY
GLUCOSE-CAPILLARY: 83 mg/dL (ref 65–99)
GLUCOSE-CAPILLARY: 99 mg/dL (ref 65–99)
Glucose-Capillary: 90 mg/dL (ref 65–99)
Glucose-Capillary: 162 mg/dL — ABNORMAL HIGH (ref 65–99)

## 2016-03-09 MED ORDER — PANTOPRAZOLE SODIUM 40 MG PO TBEC: 40.0000 mg | DELAYED_RELEASE_TABLET | Freq: Every day | ORAL | 0 refills | Status: AC

## 2016-03-09 MED ORDER — LEVOFLOXACIN 500 MG PO TABS: 500.0000 mg | ORAL_TABLET | Freq: Every day | ORAL | 0 refills | Status: AC

## 2016-03-09 MED ORDER — HYDROCODONE-ACETAMINOPHEN 5-325 MG PO TABS: 1.0000 | ORAL_TABLET | Freq: Two times a day (BID) | ORAL | 0 refills | Status: AC | PRN

## 2016-03-09 MED ORDER — FERROUS SULFATE 325 (65 FE) MG PO TABS: 325.0000 mg | ORAL_TABLET | Freq: Two times a day (BID) | ORAL | 0 refills | Status: AC

## 2016-03-09 NOTE — Progress Notes (Signed)
     [facility] Progress Note  Chief Complaint:    GI bleed  Subjective: Sitting up drinking Ensure. No complaints.   Objective:  Vital signs in last 24 hours: Temp:  [98 F (36.7 C)-98.8 F (37.1 C)] 98 F (36.7 C) (03/07 0408) Pulse Rate:  [66-69] 66 (03/07 0408) Resp:  [18] 18 (03/07 0408) BP: (123-130)/(59-76) 129/59 (03/07 0408) SpO2:  [93 %-95 %] 93 % (03/07 0408) Last BM Date: 03/07/16 General:   Alert, well-developed, Parker [name] in NAD EENT:  Normal hearing, non icteric sclera, conjunctive pink.  Heart:  Regular rate and rhythm; Loud murmur, 2+ bilateral lower extremity edema Pulm: Normal respiratory effort Abdomen:  Soft, nondistended, nontender.  Normal bowel sounds Neurologic:  Alert and  oriented Psych:  Alert and cooperative. Normal mood and affect.  Intake/Output from previous day: 03/06 0701 - 03/07 0700 In: 460 [P.O.:360; IV Piggyback:100] Out: 1400 [Urine:1400] Intake/Output this shift: No intake/output data recorded.  Lab Results:  Recent Labs  03/07/16 0547 03/08/16 0646 03/09/16 0651  WBC 10.8* 9.7 9.7  HGB 8.9* 9.0* 8.7*  HCT 27.9* 28.9* 28.8*  PLT 326 335 323   BMET  Recent Labs  03/07/16 0547 03/09/16 0651  NA 137 137  K 4.0 4.0  CL 102 104  CO2 28 27  GLUCOSE 95 95  BUN 9 10  CREATININE 0.64 0.65  CALCIUM 8.4* 8.5*     Dg Chest Port 1 View  Result Date: 03/09/2016 CLINICAL DATA:  Cough EXAM: PORTABLE CHEST 1 VIEW COMPARISON:  03/07/2016 FINDINGS: Cardiac shadow is enlarged but stable. Persistent right basilar changes are noted with slight increased density likely related to a posterior infusion. Left lung remains clear. No bony abnormality is noted. IMPRESSION: Persistent right basilar changes and likely small effusion. Electronically Signed   By: Parker [name] M.D.   On: 03/09/2016 07:13   Dg Chest Port 1 View  Result Date: 03/07/2016 CLINICAL DATA:  Cough, pneumonia, aortic stenosis EXAM: PORTABLE CHEST 1  VIEW COMPARISON:  03/03/2016 FINDINGS: Cardiomediastinal silhouette is stable. Slight worsening infiltrate/pneumonia medial aspect of the right lower lobe. Probable small right pleural effusion. No pulmonary edema. IMPRESSION: Slight worsening infiltrate/pneumonia medial aspect of the right lower lobe. Probable small right pleural effusion. No pulmonary edema. Electronically Signed   By: Parker [name] M.D.   On: 03/07/2016 13:02    Assessment / Plan:  83 yo [name] with heme positive stools / severe anemia (6.8) on Eliquis. Given advanced age, medical problems, overall deconditioning, endoscopic workup hasn't been pursued. His hgb is stable at 8.7. I spoke with Hospitaliist, patient will likely be discharged with plans to hold Eliquis for a few more days. If he must restart Eliquis then will need to be monitored for overt bleeding or declining hgb. We have spoken with Son twice this admission and he is agreeable to this approach   .Active Problems:   Diabetes type 2, controlled ([place])   Aortic stenosis   Atrial fibrillation (HCC)   Acute GI bleeding   UTI (urinary tract infection)   Acute encephalopathy     LOS: 5 days   Parker [name]  03/09/2016, 10:01 AM  Pager number [phone]

## 2016-03-09 NOTE — Discharge Summary (Addendum)
Physician Discharge Summary  Parker [name] GYK:[id number] DOB: 1923/11/04 DOA: 03/03/2016  PCP: Parker Roup, MD  Admit date: 03/03/2016 Discharge date: 03/09/2016  Admitted From: SNF Disposition:  SNF  Recommendations for Outpatient Follow-up:  1. Follow up with PCP in 1-2 weeks 2. Hold Eliquis for now, discussed with cardiology, recommending holding for now and will have follow up with Dr. Acie [name] in clinic and it will be determined whether this needs to be resumed in the future. 3. Continue Levaquin for 5 additional days to complete a 10 day course  Discharge Condition: stable CODE STATUS: DNR Diet recommendation: regular  HPI: Per Dr. Glyn [name], Parker [name] is a 83 y.o. male with atrial fibrillation, diabetes mellitus type 2, aortic stenosis, hyperlipidemia and possible CHF was brought to the ER after patient was found to be increasingly confused. Most of the history was obtained from ER physician as patient's family is not at the bedside and patient is confused. On exam patient is alert to his name and does not follow commands.  ED Course: CT of the head was unremarkable. UA shows features concerning for UTI. Patient has chronic indwelling catheter. Lab work show a drop in hemoglobin from 11 4-5 months ago to 6.8 today. Stool occult blood is positive. Patient is on Apixaban. Chest x-ray shows a retrocardiac density. Patient is being admitted for acute encephalopathy and GI bleed on possible pneumonia with UTI.  Hospital Course: Discharge Diagnoses:  Active Problems:   Diabetes type 2, controlled ([place])   Aortic stenosis   Atrial fibrillation (HCC)   Acute GI bleeding   UTI (urinary tract infection)   Acute encephalopathy   Lobar PNA -chest x-ray on admission with medial right lower lobe infiltrate concerning for pneumonia.  Patient was started on IV antibiotics, clinically he is improving, his white count is normalized, he is afebrile and breathing comfortable on room air.  We  will narrow his antibiotics to Levaquin for 5 additional days as above. Acute blood loss Anemia, occult blood positive -patient with anemia on presentation, fecal occult was positive and concern for occult GI bleed in the setting of Eliquis.  He has received 2 units of packed red blood cells and his hemoglobin has remained stable and clinically his bleeding has stopped.  Gastroenterology was consulted, and have recommended conservative management after discussion with families and no invasive procedure currently.  Patient was placed on Protonix, continue on discharge.  Would recommend to hold Eliquis for now. Repeat CBC next week, and reevaluate with cardiology in office, but for now plan to hold Eliquis indefinitely UTI - Secondary to chronic foley catheter, urine culture grew Pseudomonas which is sensitive to ciprofloxacin.  We will plan to treat for 10 days as for complicated UTI, with 5 additional days remaining, will narrow to Levaquin based on sensitivities as well as to have pulmonary coverage. Acute encephalopathy - toxic secondary to infection, resolved, TSH midly elevated, likely in the setting of acute illness, will need repeat in 3-4 weeks. Hypokalemia - replace orally, now normalized A fib - on metoprolol. Hold apixiban in setting of GI bleed. Score > 2 DM - resume home medications Constipation - resolved   Discharge Instructions   Allergies as of 03/09/2016   No Known Allergies     Medication List    STOP taking these medications   apixaban 5 MG Tabs tablet Commonly known as:  ELIQUIS     TAKE these medications   acetaminophen 500 MG tablet Commonly known as:  TYLENOL  Take 500 mg by mouth every 8 (eight) hours.   bicalutamide 50 MG tablet Commonly known as:  CASODEX Take 50 mg by mouth daily.   cyanocobalamin 1000 MCG/ML injection Commonly known as:  (VITAMIN B-12) Inject 1,000 mcg into the muscle every 30 (thirty) days.   ferrous sulfate 325 (65 FE) MG tablet Take 1  tablet (325 mg total) by mouth 2 (two) times daily with a meal.   furosemide 40 MG tablet Commonly known as:  LASIX Take 40 mg by mouth 2 (two) times daily.   HYDROcodone-acetaminophen 5-325 MG tablet Commonly known as:  NORCO/VICODIN Take 1 tablet by mouth 2 (two) times daily as needed for moderate pain.   levofloxacin 500 MG tablet Commonly known as:  LEVAQUIN Take 1 tablet (500 mg total) by mouth daily. For 5 more days   metFORMIN 500 MG 24 hr tablet Commonly known as:  GLUCOPHAGE-XR Take 500 mg by mouth daily.   metoprolol succinate 25 MG 24 hr tablet Commonly known as:  TOPROL XL Take 0.5 tablets (12.5 mg total) by mouth daily.   pantoprazole 40 MG tablet Commonly known as:  PROTONIX Take 1 tablet (40 mg total) by mouth daily.   polyethylene glycol packet Commonly known as:  MIRALAX / GLYCOLAX Take 17 g by mouth daily as needed (for constipation).   pravastatin 10 MG tablet Commonly known as:  PRAVACHOL Take 10 mg by mouth at bedtime.   ROBAFEN DM 10-100 MG/5ML liquid Generic drug:  Dextromethorphan-Guaifenesin Take 10 mLs by mouth every 6 (six) hours as needed (cough).   SENEXON-S PO Take 1 tablet by mouth daily. Hold for loose stools      Contact information for after-discharge care    [facility] Follow up.   Specialty:  [place] information: [place] [phone]             No Known Allergies  Consultations:  Cardiology  Gastroenterology  Procedures/Studies:  Dg Chest 2 View  Result Date: 03/03/2016 CLINICAL DATA:  Shortness of breath and altered mental status. EXAM: CHEST  2 VIEW COMPARISON:  None. FINDINGS: The heart is enlarged with aortic tortuosity and atherosclerosis. Lungs appear hyperinflated. Ill-defined opacity posteriorly on the lateral view may localize to the medial right lower lobe. No pulmonary edema or pleural fluid.  No pneumothorax. Compression fractures in the mid and lower thoracic spine, lower compression fracture is chronic based on lumbar spine radiographs 03/11/2015. The bones are under mineralized. IMPRESSION: 1. Ill-defined retrocardiac opacity likely in the medial right lower lobe may be atelectasis or pneumonia. 2. Cardiomegaly with aortic tortuosity and atherosclerosis. 3. Compression fractures in the thoracic spine, at least 1 of which is chronic. Electronically Signed   By: [name] M.D.   On: 03/03/2016 23:36   Dg Abdomen 1 View  Result Date: 03/04/2016 CLINICAL DATA:  Acute onset of constipation.  Initial encounter. EXAM: ABDOMEN - 1 VIEW COMPARISON:  Lumbar spine radiographs performed 03/11/2015 FINDINGS: The visualized bowel gas pattern is unremarkable. Scattered air and stool filled loops of colon are seen; no abnormal dilatation of small bowel loops is seen to suggest small bowel obstruction. No free intra-abdominal air is identified, though evaluation for free air is limited on a single supine view. The visualized osseous structures are within normal limits; the sacroiliac joints are unremarkable in appearance. The visualized lung bases are essentially clear. Diffuse calcification is seen along the abdominal aorta. The abdominal  aorta is tortuous in appearance. IMPRESSION: 1. Unremarkable bowel gas pattern; no free intra-abdominal air seen. Moderate amount of stool noted in the colon. 2. Diffuse aortic atherosclerosis. The abdominal aorta is tortuous in appearance. Electronically Signed   By: Garald Balding M.D.   On: 03/04/2016 01:30   Ct Head Wo Contrast  Result Date: 03/04/2016 CLINICAL DATA:  Acute onset of altered mental status. Initial encounter. EXAM: CT HEAD WITHOUT CONTRAST TECHNIQUE: Contiguous axial images were obtained from the base of the skull through the vertex without intravenous contrast. COMPARISON:  CT of the head performed 07/28/2014 FINDINGS: Brain: No evidence of acute  infarction, hemorrhage, hydrocephalus, extra-axial collection or mass lesion/mass effect. Prominence of the ventricles and sulci reflects moderately severe cortical volume loss. Cerebellar atrophy is noted. Diffuse periventricular and subcortical white matter change likely reflects small vessel ischemic microangiopathy. Chronic ischemic change is seen at the external capsule bilaterally. The brainstem and fourth ventricle are within normal limits. The cerebral hemispheres demonstrate grossly normal gray-white differentiation. No mass effect or midline shift is seen. Vascular: No hyperdense vessel or unexpected calcification. Skull: There is no evidence of fracture; visualized osseous structures are unremarkable in appearance. Sinuses/Orbits: The orbits are within normal limits. The paranasal sinuses and mastoid air cells are well-aerated. Other: No significant soft tissue abnormalities are seen. IMPRESSION: 1. No acute intracranial pathology seen on CT. 2. Moderately severe cortical volume loss and diffuse small vessel ischemic microangiopathy. 3. Chronic ischemic change at the external capsule bilaterally. Electronically Signed   By: Garald Balding M.D.   On: 03/04/2016 01:32   Dg Chest Port 1 View  Result Date: 03/09/2016 CLINICAL DATA:  Cough EXAM: PORTABLE CHEST 1 VIEW COMPARISON:  03/07/2016 FINDINGS: Cardiac shadow is enlarged but stable. Persistent right basilar changes are noted with slight increased density likely related to a posterior infusion. Left lung remains clear. No bony abnormality is noted. IMPRESSION: Persistent right basilar changes and likely small effusion. Electronically Signed   By: Inez Catalina M.D.   On: 03/09/2016 07:13   Dg Chest Port 1 View  Result Date: 03/07/2016 CLINICAL DATA:  Cough, pneumonia, aortic stenosis EXAM: PORTABLE CHEST 1 VIEW COMPARISON:  03/03/2016 FINDINGS: Cardiomediastinal silhouette is stable. Slight worsening infiltrate/pneumonia medial aspect of the right  lower lobe. Probable small right pleural effusion. No pulmonary edema. IMPRESSION: Slight worsening infiltrate/pneumonia medial aspect of the right lower lobe. Probable small right pleural effusion. No pulmonary edema. Electronically Signed   By: Lahoma Crocker M.D.   On: 03/07/2016 13:02    Subjective: - no chest pain, shortness of breath, no abdominal pain, nausea or vomiting.    Discharge Exam: Vitals:   03/08/16 2011 03/09/16 0408  BP: 123/62 (!) 129/59  Pulse: 66 66  Resp: 18 18  Temp: 98.8 F (37.1 C) 98 F (36.7 C)   Vitals:   03/08/16 1407 03/08/16 2011 03/08/16 2022 03/09/16 0408  BP: 130/76 123/62  (!) 129/59  Pulse: 69 66  66  Resp: 18 18  18   Temp: 98.6 F (37 C) 98.8 F (37.1 C)  98 F (36.7 C)  TempSrc: Oral Oral  Axillary  SpO2: 95% 93% 94% 93%  Weight:      Height:        General: Pt is alert, awake, not in acute distress Cardiovascular: irregular Respiratory: CTA bilaterally, no wheezing, no rhonchi Abdominal: Soft, NT, ND, bowel sounds + Extremities: no edema, no cyanosis    The results of significant diagnostics from this hospitalization (including imaging,  microbiology, ancillary and laboratory) are listed below for reference.     Microbiology: Recent Results (from the past 240 hour(s))  Urine culture     Status: Abnormal   Collection Time: 03/04/16 12:13 AM  Result Value Ref Range Status   Specimen Description URINE, CLEAN CATCH  Final   Special Requests NONE  Final   Culture >=100,000 COLONIES/mL PSEUDOMONAS AERUGINOSA (A)  Final   Report Status 03/06/2016 FINAL  Final   Organism ID, Bacteria PSEUDOMONAS AERUGINOSA (A)  Final      Susceptibility   Pseudomonas aeruginosa - MIC*    CEFTAZIDIME 4 SENSITIVE Sensitive     CIPROFLOXACIN <=0.25 SENSITIVE Sensitive     GENTAMICIN <=1 SENSITIVE Sensitive     IMIPENEM 2 SENSITIVE Sensitive     PIP/TAZO 16 SENSITIVE Sensitive     CEFEPIME 8 SENSITIVE Sensitive     * >=100,000 COLONIES/mL PSEUDOMONAS  AERUGINOSA  MRSA PCR Screening     Status: None   Collection Time: 03/04/16  8:00 AM  Result Value Ref Range Status   MRSA by PCR NEGATIVE NEGATIVE Final    Comment:        The GeneXpert MRSA Assay (FDA approved for NASAL specimens only), is one component of a comprehensive MRSA colonization surveillance program. It is not intended to diagnose MRSA infection nor to guide or monitor treatment for MRSA infections.      Labs: BNP (last 3 results) No results for input(s): BNP in the last 8760 hours. Basic Metabolic Panel:  Recent Labs Lab 03/04/16 0825 03/05/16 0538 03/06/16 0553 03/07/16 0547 03/09/16 0651  NA 136 138 137 137 137  K 3.4* 3.7 4.2 4.0 4.0  CL 97* 103 102 102 104  CO2 31 29 30 28 27   GLUCOSE 102* 92 95 95 95  BUN 34* 18 11 9 10   CREATININE 1.16 0.75 0.77 0.64 0.65  CALCIUM 8.6* 8.2* 8.5* 8.4* 8.5*   Liver Function Tests: No results for input(s): AST, ALT, ALKPHOS, BILITOT, PROT, ALBUMIN in the last 168 hours. No results for input(s): LIPASE, AMYLASE in the last 168 hours. No results for input(s): AMMONIA in the last 168 hours. CBC:  Recent Labs Lab 03/03/16 2358  03/05/16 0538 03/06/16 0553 03/07/16 0547 03/08/16 0646 03/09/16 0651  WBC 11.5*  < > 9.1 10.0 10.8* 9.7 9.7  NEUTROABS 7.8*  --   --   --   --   --   --   HGB 6.8*  < > 8.2* 8.5* 8.9* 9.0* 8.7*  HCT 22.2*  < > 25.8* 27.2* 27.9* 28.9* 28.8*  MCV 82.2  < > 83.5 84.2 84.3 85.0 84.2  PLT 324  < > 297 322 326 335 323  < > = values in this interval not displayed. Cardiac Enzymes: No results for input(s): CKTOTAL, CKMB, CKMBINDEX, TROPONINI in the last 168 hours. BNP: Invalid input(s): POCBNP CBG:  Recent Labs Lab 03/08/16 1626 03/08/16 2009 03/09/16 0013 03/09/16 0406 03/09/16 0735  GLUCAP 105* 116* 99 83 90   D-Dimer No results for input(s): DDIMER in the last 72 hours. Hgb A1c No results for input(s): HGBA1C in the last 72 hours. Lipid Profile No results for input(s):  CHOL, HDL, LDLCALC, TRIG, CHOLHDL, LDLDIRECT in the last 72 hours. Thyroid function studies  Recent Labs  03/08/16 0646  TSH 4.540*   Anemia work up No results for input(s): VITAMINB12, FOLATE, FERRITIN, TIBC, IRON, RETICCTPCT in the last 72 hours. Urinalysis    Component Value Date/Time   COLORURINE  YELLOW 03/03/2016 0013   APPEARANCEUR TURBID (A) 03/03/2016 0013   LABSPEC 1.010 03/03/2016 0013   PHURINE 5.0 03/03/2016 0013   GLUCOSEU NEGATIVE 03/03/2016 0013   HGBUR MODERATE (A) 03/03/2016 0013   BILIRUBINUR NEGATIVE 03/03/2016 0013   KETONESUR NEGATIVE 03/03/2016 0013   PROTEINUR 100 (A) 03/03/2016 0013   UROBILINOGEN 0.2 07/28/2014 0117   NITRITE NEGATIVE 03/03/2016 0013   LEUKOCYTESUR LARGE (A) 03/03/2016 0013   Sepsis Labs Invalid input(s): PROCALCITONIN,  WBC,  LACTICIDVEN Microbiology Recent Results (from the past 240 hour(s))  Urine culture     Status: Abnormal   Collection Time: 03/04/16 12:13 AM  Result Value Ref Range Status   Specimen Description URINE, CLEAN CATCH  Final   Special Requests NONE  Final   Culture >=100,000 COLONIES/mL PSEUDOMONAS AERUGINOSA (A)  Final   Report Status 03/06/2016 FINAL  Final   Organism ID, Bacteria PSEUDOMONAS AERUGINOSA (A)  Final      Susceptibility   Pseudomonas aeruginosa - MIC*    CEFTAZIDIME 4 SENSITIVE Sensitive     CIPROFLOXACIN <=0.25 SENSITIVE Sensitive     GENTAMICIN <=1 SENSITIVE Sensitive     IMIPENEM 2 SENSITIVE Sensitive     PIP/TAZO 16 SENSITIVE Sensitive     CEFEPIME 8 SENSITIVE Sensitive     * >=100,000 COLONIES/mL PSEUDOMONAS AERUGINOSA  MRSA PCR Screening     Status: None   Collection Time: 03/04/16  8:00 AM  Result Value Ref Range Status   MRSA by PCR NEGATIVE NEGATIVE Final    Comment:        The GeneXpert MRSA Assay (FDA approved for NASAL specimens only), is one component of a comprehensive MRSA colonization surveillance program. It is not intended to diagnose MRSA infection nor to guide  or monitor treatment for MRSA infections.      Time coordinating discharge: 35 minutes  SIGNED:  Marzetta Board, MD  [facility] 03/09/2016, 10:34 AM Pager [phone]  If 7PM-7AM, please contact night-coverage [email or web address] Password [email or web address]

## 2016-03-09 NOTE — Progress Notes (Signed)
Report given to [facility] [place]. No questions or concerns at this time. Patient's son [name]) has been updated. Patient will be transferred via [place].

## 2016-03-09 NOTE — Clinical Social Work Placement (Addendum)
PTAR scheduled for 3:00pm pick up.   CLINICAL SOCIAL WORK PLACEMENT  NOTE  Date:  03/09/2016  Patient Details  Name: Parker [name] MRN: [id number] Date of Birth: [birth year]  Clinical Social Work is seeking post-discharge placement for this patient at the [place] level of care (*CSW will initial, date and re-position this form in  chart as items are completed):  Yes   Patient/family provided with [place] Work Department's list of facilities offering this level of care within the geographic area requested by the patient (or if unable, by the patient's family).  Yes   Patient/family informed of their freedom to choose among providers that offer the needed level of care, that participate in Medicare, Medicaid or managed care program needed by the patient, have an available bed and are willing to accept the patient.  Yes   Patient/family informed of [facility]'s ownership interest in [facility] and [facility], as well as of the fact that they are under no obligation to receive care at these facilities.  PASRR submitted to EDS on       PASRR number received on       Existing PASRR number confirmed on 03/07/16     FL2 transmitted to all facilities in geographic area requested by pt/family on       FL2 transmitted to all facilities within larger geographic area on 03/07/16     Patient informed that his/her managed care company has contracts with or will negotiate with certain facilities, including the following:        Yes   Patient/family informed of bed offers received.  Patient chooses bed at [facility]     Physician recommends and patient chooses bed at      Patient to be transferred to [facility] on 03/09/16.  Patient to be transferred to facility by PTAR     Patient family notified on 03/09/16 of transfer.  Name of family member notified:  Doloris Hall      PHYSICIAN Please sign DNR, Please  prepare priority discharge summary, including medications     Additional Comment:    _______________________________________________ Lia Hopping, LCSW 03/09/2016, 9:53 AM

## 2016-03-09 NOTE — Progress Notes (Signed)
Physical Therapy Treatment Patient Details Name: Parker [name] MRN: [id number] DOB: 1923-09-20 Today's Date: 03/09/2016    History of Present Illness This 83 y.o. male admitted from [facility]F with confusion.   CT of head neck negative.  Hbg 6.8.  Dx:  acute encephalopathy, GI bleed, possible PNA with UTI.  PMH includes:  Aortic stenosis, A-Fib, Edema bil. LEs, DM, Neurogenic bladder, LBP, prostate fx, h/o ORIF humerus due to fx.     PT Comments    Pt tolerated increased activity today, +2 max assist for sit to stand, mod A to ambulate 5' with RW. Performed seated BUE/LE therapeutic exercise.   Follow Up Recommendations  SNF;Supervision for mobility/OOB     Equipment Recommendations  None recommended by PT    Recommendations for Other Services       Precautions / Restrictions Precautions Precautions: Fall Restrictions Weight Bearing Restrictions: No    Mobility  Bed Mobility Overal bed mobility: Needs Assistance Bed Mobility: Supine to Sit     Supine to sit: Max assist     General bed mobility comments: assist to raise trunk, pt able to initiate advancing BLEs towards EOB  Transfers Overall transfer level: Needs assistance Equipment used: Rolling walker (2 wheeled) Transfers: Sit to/from [facility] Sit to Stand: Max assist         General transfer comment: Max A to rise/steady, flexed standing posture, VCs to lift head/chest; sit to stand x 2, pt stood for ~90 sec with RW for pericare  Ambulation/Gait Ambulation/Gait assistance: +2 safety/equipment;Min assist Ambulation Distance (Feet): 5 Feet Assistive device: Rolling walker (2 wheeled) Gait Pattern/deviations: Step-to pattern;Decreased stride length;Trunk flexed;Decreased step length - right;Decreased step length - left   Gait velocity interpretation: Below normal speed for age/gender General Gait Details: distance limited by fatigue, flexed posture, VCs for posture   Stairs             Wheelchair Mobility    Modified Rankin (Stroke Patients Only)       Balance Overall balance assessment: Needs assistance Sitting-balance support: Bilateral upper extremity supported;Feet supported Sitting balance-Leahy Scale: Fair     Standing balance support: Bilateral upper extremity supported Standing balance-Leahy Scale: Poor                      Cognition Arousal/Alertness: Awake/alert Behavior During Therapy: WFL for tasks assessed/performed Overall Cognitive Status: Within Functional Limits for tasks assessed                      Exercises General Exercises - Upper Extremity Shoulder Flexion: AROM;Both;Seated;10 reps General Exercises - Lower Extremity Ankle Circles/Pumps: AROM;Both;10 reps;Supine Long Arc Quad: AROM;Both;10 reps;Seated    General Comments        Pertinent Vitals/Pain Pain Assessment: No/denies pain    Home Living                      Prior Function            PT Goals (current goals can now be found in the care plan section) Acute Rehab PT Goals Patient Stated Goal: to get stronger  PT Goal Formulation: With patient Time For Goal Achievement: 03/20/16 Potential to Achieve Goals: Good Progress towards PT goals: Progressing toward goals    Frequency    Min 3X/week      PT Plan Current plan remains appropriate    Co-evaluation  End of Session Equipment Utilized During Treatment: Gait belt Activity Tolerance: Patient limited by fatigue Patient left: with call bell/phone within reach;in chair;with chair alarm set Nurse Communication: Mobility status PT Visit Diagnosis: Muscle weakness (generalized) (M62.81)     Time: 1010-1035 PT Time Calculation (min) (ACUTE ONLY): 25 min  Charges:  $Therapeutic Exercise: 8-22 mins $Therapeutic Activity: 8-22 mins                    G Codes:       Philomena Doheny 03/09/2016, 10:44 AM [phone]

## 2016-03-10 DIAGNOSIS — I4891 Unspecified atrial fibrillation: Secondary | ICD-10-CM | POA: Diagnosis not present

## 2016-03-10 DIAGNOSIS — J189 Pneumonia, unspecified organism: Secondary | ICD-10-CM | POA: Diagnosis not present

## 2016-03-10 DIAGNOSIS — K922 Gastrointestinal hemorrhage, unspecified: Secondary | ICD-10-CM | POA: Diagnosis not present

## 2016-03-10 DIAGNOSIS — N390 Urinary tract infection, site not specified: Secondary | ICD-10-CM | POA: Diagnosis not present

## 2016-03-16 DIAGNOSIS — D649 Anemia, unspecified: Secondary | ICD-10-CM | POA: Diagnosis not present

## 2016-03-16 DIAGNOSIS — K922 Gastrointestinal hemorrhage, unspecified: Secondary | ICD-10-CM | POA: Diagnosis not present

## 2016-03-16 DIAGNOSIS — N390 Urinary tract infection, site not specified: Secondary | ICD-10-CM | POA: Diagnosis not present

## 2016-03-16 DIAGNOSIS — I4891 Unspecified atrial fibrillation: Secondary | ICD-10-CM | POA: Diagnosis not present

## 2016-03-16 DIAGNOSIS — J189 Pneumonia, unspecified organism: Secondary | ICD-10-CM | POA: Diagnosis not present

## 2016-03-16 DIAGNOSIS — E119 Type 2 diabetes mellitus without complications: Secondary | ICD-10-CM | POA: Diagnosis not present

## 2016-03-16 DIAGNOSIS — Z66 Do not resuscitate: Secondary | ICD-10-CM | POA: Diagnosis not present

## 2016-03-17 ENCOUNTER — Other Ambulatory Visit: Payer: Self-pay | Admitting: *Deleted

## 2016-03-17 NOTE — Patient Outreach (Signed)
[facility]) Care Management  03/17/2016  Phenix Vandermeulen 1923-04-17 [id number]   Met with Izell Plymouth, SW for facility. He reports that patient will discharge to ALF, he is assisting family with placement.  Plan to sign off at this time as no [facility] community care management needs, requested SW to consult RNCM if discharge plans or needs change, he agrees. Royetta Crochet. Laymond Purser, RN, BSN, [place] [phone]) Business Cell  [phone]) Toll Free Office

## 2016-03-21 DIAGNOSIS — E119 Type 2 diabetes mellitus without complications: Secondary | ICD-10-CM | POA: Diagnosis not present

## 2016-03-21 DIAGNOSIS — I4891 Unspecified atrial fibrillation: Secondary | ICD-10-CM | POA: Diagnosis not present

## 2016-03-21 DIAGNOSIS — J189 Pneumonia, unspecified organism: Secondary | ICD-10-CM | POA: Diagnosis not present

## 2016-03-21 DIAGNOSIS — K922 Gastrointestinal hemorrhage, unspecified: Secondary | ICD-10-CM | POA: Diagnosis not present

## 2016-03-31 DIAGNOSIS — E119 Type 2 diabetes mellitus without complications: Secondary | ICD-10-CM | POA: Diagnosis not present

## 2016-03-31 DIAGNOSIS — K922 Gastrointestinal hemorrhage, unspecified: Secondary | ICD-10-CM | POA: Diagnosis not present

## 2016-03-31 DIAGNOSIS — J189 Pneumonia, unspecified organism: Secondary | ICD-10-CM | POA: Diagnosis not present

## 2016-03-31 DIAGNOSIS — I4891 Unspecified atrial fibrillation: Secondary | ICD-10-CM | POA: Diagnosis not present

## 2016-04-10 DIAGNOSIS — E119 Type 2 diabetes mellitus without complications: Secondary | ICD-10-CM | POA: Diagnosis not present

## 2016-04-10 DIAGNOSIS — E785 Hyperlipidemia, unspecified: Secondary | ICD-10-CM | POA: Diagnosis not present

## 2016-04-10 DIAGNOSIS — C61 Malignant neoplasm of prostate: Secondary | ICD-10-CM | POA: Diagnosis not present

## 2016-04-10 DIAGNOSIS — N390 Urinary tract infection, site not specified: Secondary | ICD-10-CM | POA: Diagnosis not present

## 2016-04-10 DIAGNOSIS — R278 Other lack of coordination: Secondary | ICD-10-CM | POA: Diagnosis not present

## 2016-04-10 DIAGNOSIS — K219 Gastro-esophageal reflux disease without esophagitis: Secondary | ICD-10-CM | POA: Diagnosis not present

## 2016-04-10 DIAGNOSIS — D631 Anemia in chronic kidney disease: Secondary | ICD-10-CM | POA: Diagnosis not present

## 2016-04-10 DIAGNOSIS — R2689 Other abnormalities of gait and mobility: Secondary | ICD-10-CM | POA: Diagnosis not present

## 2016-04-10 DIAGNOSIS — I350 Nonrheumatic aortic (valve) stenosis: Secondary | ICD-10-CM | POA: Diagnosis not present

## 2016-04-11 DIAGNOSIS — I350 Nonrheumatic aortic (valve) stenosis: Secondary | ICD-10-CM | POA: Diagnosis not present

## 2016-04-11 DIAGNOSIS — N390 Urinary tract infection, site not specified: Secondary | ICD-10-CM | POA: Diagnosis not present

## 2016-04-11 DIAGNOSIS — R278 Other lack of coordination: Secondary | ICD-10-CM | POA: Diagnosis not present

## 2016-04-11 DIAGNOSIS — E785 Hyperlipidemia, unspecified: Secondary | ICD-10-CM | POA: Diagnosis not present

## 2016-04-11 DIAGNOSIS — E119 Type 2 diabetes mellitus without complications: Secondary | ICD-10-CM | POA: Diagnosis not present

## 2016-04-11 DIAGNOSIS — K219 Gastro-esophageal reflux disease without esophagitis: Secondary | ICD-10-CM | POA: Diagnosis not present

## 2016-04-11 DIAGNOSIS — D631 Anemia in chronic kidney disease: Secondary | ICD-10-CM | POA: Diagnosis not present

## 2016-04-11 DIAGNOSIS — I509 Heart failure, unspecified: Secondary | ICD-10-CM | POA: Diagnosis not present

## 2016-04-11 DIAGNOSIS — C61 Malignant neoplasm of prostate: Secondary | ICD-10-CM | POA: Diagnosis not present

## 2016-04-11 DIAGNOSIS — I4891 Unspecified atrial fibrillation: Secondary | ICD-10-CM | POA: Diagnosis not present

## 2016-04-11 DIAGNOSIS — R2689 Other abnormalities of gait and mobility: Secondary | ICD-10-CM | POA: Diagnosis not present

## 2016-04-12 DIAGNOSIS — D631 Anemia in chronic kidney disease: Secondary | ICD-10-CM | POA: Diagnosis not present

## 2016-04-12 DIAGNOSIS — N390 Urinary tract infection, site not specified: Secondary | ICD-10-CM | POA: Diagnosis not present

## 2016-04-12 DIAGNOSIS — R2689 Other abnormalities of gait and mobility: Secondary | ICD-10-CM | POA: Diagnosis not present

## 2016-04-12 DIAGNOSIS — E119 Type 2 diabetes mellitus without complications: Secondary | ICD-10-CM | POA: Diagnosis not present

## 2016-04-12 DIAGNOSIS — E785 Hyperlipidemia, unspecified: Secondary | ICD-10-CM | POA: Diagnosis not present

## 2016-04-12 DIAGNOSIS — K219 Gastro-esophageal reflux disease without esophagitis: Secondary | ICD-10-CM | POA: Diagnosis not present

## 2016-04-12 DIAGNOSIS — I350 Nonrheumatic aortic (valve) stenosis: Secondary | ICD-10-CM | POA: Diagnosis not present

## 2016-04-12 DIAGNOSIS — R278 Other lack of coordination: Secondary | ICD-10-CM | POA: Diagnosis not present

## 2016-04-12 DIAGNOSIS — C61 Malignant neoplasm of prostate: Secondary | ICD-10-CM | POA: Diagnosis not present

## 2016-04-13 DIAGNOSIS — K922 Gastrointestinal hemorrhage, unspecified: Secondary | ICD-10-CM | POA: Diagnosis not present

## 2016-04-13 DIAGNOSIS — N390 Urinary tract infection, site not specified: Secondary | ICD-10-CM | POA: Diagnosis not present

## 2016-04-13 DIAGNOSIS — K219 Gastro-esophageal reflux disease without esophagitis: Secondary | ICD-10-CM | POA: Diagnosis not present

## 2016-04-13 DIAGNOSIS — R278 Other lack of coordination: Secondary | ICD-10-CM | POA: Diagnosis not present

## 2016-04-13 DIAGNOSIS — E119 Type 2 diabetes mellitus without complications: Secondary | ICD-10-CM | POA: Diagnosis not present

## 2016-04-13 DIAGNOSIS — C61 Malignant neoplasm of prostate: Secondary | ICD-10-CM | POA: Diagnosis not present

## 2016-04-13 DIAGNOSIS — I350 Nonrheumatic aortic (valve) stenosis: Secondary | ICD-10-CM | POA: Diagnosis not present

## 2016-04-13 DIAGNOSIS — E785 Hyperlipidemia, unspecified: Secondary | ICD-10-CM | POA: Diagnosis not present

## 2016-04-13 DIAGNOSIS — I2510 Atherosclerotic heart disease of native coronary artery without angina pectoris: Secondary | ICD-10-CM | POA: Diagnosis not present

## 2016-04-13 DIAGNOSIS — R2689 Other abnormalities of gait and mobility: Secondary | ICD-10-CM | POA: Diagnosis not present

## 2016-04-13 DIAGNOSIS — D631 Anemia in chronic kidney disease: Secondary | ICD-10-CM | POA: Diagnosis not present

## 2016-04-13 DIAGNOSIS — I4891 Unspecified atrial fibrillation: Secondary | ICD-10-CM | POA: Diagnosis not present

## 2016-04-13 DIAGNOSIS — D649 Anemia, unspecified: Secondary | ICD-10-CM | POA: Diagnosis not present

## 2016-04-14 DIAGNOSIS — N390 Urinary tract infection, site not specified: Secondary | ICD-10-CM | POA: Diagnosis not present

## 2016-04-14 DIAGNOSIS — I350 Nonrheumatic aortic (valve) stenosis: Secondary | ICD-10-CM | POA: Diagnosis not present

## 2016-04-14 DIAGNOSIS — K219 Gastro-esophageal reflux disease without esophagitis: Secondary | ICD-10-CM | POA: Diagnosis not present

## 2016-04-14 DIAGNOSIS — C61 Malignant neoplasm of prostate: Secondary | ICD-10-CM | POA: Diagnosis not present

## 2016-04-14 DIAGNOSIS — R278 Other lack of coordination: Secondary | ICD-10-CM | POA: Diagnosis not present

## 2016-04-14 DIAGNOSIS — D631 Anemia in chronic kidney disease: Secondary | ICD-10-CM | POA: Diagnosis not present

## 2016-04-14 DIAGNOSIS — R2689 Other abnormalities of gait and mobility: Secondary | ICD-10-CM | POA: Diagnosis not present

## 2016-04-14 DIAGNOSIS — E785 Hyperlipidemia, unspecified: Secondary | ICD-10-CM | POA: Diagnosis not present

## 2016-04-14 DIAGNOSIS — E119 Type 2 diabetes mellitus without complications: Secondary | ICD-10-CM | POA: Diagnosis not present

## 2016-04-15 DIAGNOSIS — E785 Hyperlipidemia, unspecified: Secondary | ICD-10-CM | POA: Diagnosis not present

## 2016-04-15 DIAGNOSIS — D631 Anemia in chronic kidney disease: Secondary | ICD-10-CM | POA: Diagnosis not present

## 2016-04-15 DIAGNOSIS — N390 Urinary tract infection, site not specified: Secondary | ICD-10-CM | POA: Diagnosis not present

## 2016-04-15 DIAGNOSIS — K219 Gastro-esophageal reflux disease without esophagitis: Secondary | ICD-10-CM | POA: Diagnosis not present

## 2016-04-15 DIAGNOSIS — E119 Type 2 diabetes mellitus without complications: Secondary | ICD-10-CM | POA: Diagnosis not present

## 2016-04-15 DIAGNOSIS — C61 Malignant neoplasm of prostate: Secondary | ICD-10-CM | POA: Diagnosis not present

## 2016-04-15 DIAGNOSIS — I509 Heart failure, unspecified: Secondary | ICD-10-CM | POA: Diagnosis not present

## 2016-04-15 DIAGNOSIS — R278 Other lack of coordination: Secondary | ICD-10-CM | POA: Diagnosis not present

## 2016-04-15 DIAGNOSIS — I350 Nonrheumatic aortic (valve) stenosis: Secondary | ICD-10-CM | POA: Diagnosis not present

## 2016-04-15 DIAGNOSIS — R609 Edema, unspecified: Secondary | ICD-10-CM | POA: Diagnosis not present

## 2016-04-15 DIAGNOSIS — R2689 Other abnormalities of gait and mobility: Secondary | ICD-10-CM | POA: Diagnosis not present

## 2016-04-15 DIAGNOSIS — I4891 Unspecified atrial fibrillation: Secondary | ICD-10-CM | POA: Diagnosis not present

## 2016-04-17 DIAGNOSIS — R609 Edema, unspecified: Secondary | ICD-10-CM | POA: Diagnosis not present

## 2016-04-17 DIAGNOSIS — E119 Type 2 diabetes mellitus without complications: Secondary | ICD-10-CM | POA: Diagnosis not present

## 2016-04-17 DIAGNOSIS — I509 Heart failure, unspecified: Secondary | ICD-10-CM | POA: Diagnosis not present

## 2016-04-17 DIAGNOSIS — N319 Neuromuscular dysfunction of bladder, unspecified: Secondary | ICD-10-CM | POA: Diagnosis not present

## 2016-04-18 DIAGNOSIS — K219 Gastro-esophageal reflux disease without esophagitis: Secondary | ICD-10-CM | POA: Diagnosis not present

## 2016-04-18 DIAGNOSIS — D631 Anemia in chronic kidney disease: Secondary | ICD-10-CM | POA: Diagnosis not present

## 2016-04-18 DIAGNOSIS — R2689 Other abnormalities of gait and mobility: Secondary | ICD-10-CM | POA: Diagnosis not present

## 2016-04-18 DIAGNOSIS — I350 Nonrheumatic aortic (valve) stenosis: Secondary | ICD-10-CM | POA: Diagnosis not present

## 2016-04-18 DIAGNOSIS — E785 Hyperlipidemia, unspecified: Secondary | ICD-10-CM | POA: Diagnosis not present

## 2016-04-18 DIAGNOSIS — E119 Type 2 diabetes mellitus without complications: Secondary | ICD-10-CM | POA: Diagnosis not present

## 2016-04-18 DIAGNOSIS — C61 Malignant neoplasm of prostate: Secondary | ICD-10-CM | POA: Diagnosis not present

## 2016-04-18 DIAGNOSIS — N390 Urinary tract infection, site not specified: Secondary | ICD-10-CM | POA: Diagnosis not present

## 2016-04-18 DIAGNOSIS — R278 Other lack of coordination: Secondary | ICD-10-CM | POA: Diagnosis not present

## 2016-04-19 DIAGNOSIS — K219 Gastro-esophageal reflux disease without esophagitis: Secondary | ICD-10-CM | POA: Diagnosis not present

## 2016-04-19 DIAGNOSIS — D631 Anemia in chronic kidney disease: Secondary | ICD-10-CM | POA: Diagnosis not present

## 2016-04-19 DIAGNOSIS — R278 Other lack of coordination: Secondary | ICD-10-CM | POA: Diagnosis not present

## 2016-04-19 DIAGNOSIS — E785 Hyperlipidemia, unspecified: Secondary | ICD-10-CM | POA: Diagnosis not present

## 2016-04-19 DIAGNOSIS — R2689 Other abnormalities of gait and mobility: Secondary | ICD-10-CM | POA: Diagnosis not present

## 2016-04-19 DIAGNOSIS — E119 Type 2 diabetes mellitus without complications: Secondary | ICD-10-CM | POA: Diagnosis not present

## 2016-04-19 DIAGNOSIS — C61 Malignant neoplasm of prostate: Secondary | ICD-10-CM | POA: Diagnosis not present

## 2016-04-19 DIAGNOSIS — N390 Urinary tract infection, site not specified: Secondary | ICD-10-CM | POA: Diagnosis not present

## 2016-04-19 DIAGNOSIS — I350 Nonrheumatic aortic (valve) stenosis: Secondary | ICD-10-CM | POA: Diagnosis not present

## 2016-04-20 DIAGNOSIS — R2689 Other abnormalities of gait and mobility: Secondary | ICD-10-CM | POA: Diagnosis not present

## 2016-04-20 DIAGNOSIS — I350 Nonrheumatic aortic (valve) stenosis: Secondary | ICD-10-CM | POA: Diagnosis not present

## 2016-04-20 DIAGNOSIS — R278 Other lack of coordination: Secondary | ICD-10-CM | POA: Diagnosis not present

## 2016-04-20 DIAGNOSIS — D631 Anemia in chronic kidney disease: Secondary | ICD-10-CM | POA: Diagnosis not present

## 2016-04-20 DIAGNOSIS — N390 Urinary tract infection, site not specified: Secondary | ICD-10-CM | POA: Diagnosis not present

## 2016-04-20 DIAGNOSIS — E119 Type 2 diabetes mellitus without complications: Secondary | ICD-10-CM | POA: Diagnosis not present

## 2016-04-20 DIAGNOSIS — K219 Gastro-esophageal reflux disease without esophagitis: Secondary | ICD-10-CM | POA: Diagnosis not present

## 2016-04-20 DIAGNOSIS — E785 Hyperlipidemia, unspecified: Secondary | ICD-10-CM | POA: Diagnosis not present

## 2016-04-20 DIAGNOSIS — C61 Malignant neoplasm of prostate: Secondary | ICD-10-CM | POA: Diagnosis not present

## 2016-04-21 DIAGNOSIS — E785 Hyperlipidemia, unspecified: Secondary | ICD-10-CM | POA: Diagnosis not present

## 2016-04-21 DIAGNOSIS — K219 Gastro-esophageal reflux disease without esophagitis: Secondary | ICD-10-CM | POA: Diagnosis not present

## 2016-04-21 DIAGNOSIS — C61 Malignant neoplasm of prostate: Secondary | ICD-10-CM | POA: Diagnosis not present

## 2016-04-21 DIAGNOSIS — I350 Nonrheumatic aortic (valve) stenosis: Secondary | ICD-10-CM | POA: Diagnosis not present

## 2016-04-21 DIAGNOSIS — N390 Urinary tract infection, site not specified: Secondary | ICD-10-CM | POA: Diagnosis not present

## 2016-04-21 DIAGNOSIS — D631 Anemia in chronic kidney disease: Secondary | ICD-10-CM | POA: Diagnosis not present

## 2016-04-21 DIAGNOSIS — R278 Other lack of coordination: Secondary | ICD-10-CM | POA: Diagnosis not present

## 2016-04-21 DIAGNOSIS — E119 Type 2 diabetes mellitus without complications: Secondary | ICD-10-CM | POA: Diagnosis not present

## 2016-04-21 DIAGNOSIS — R2689 Other abnormalities of gait and mobility: Secondary | ICD-10-CM | POA: Diagnosis not present

## 2016-04-22 DIAGNOSIS — R2689 Other abnormalities of gait and mobility: Secondary | ICD-10-CM | POA: Diagnosis not present

## 2016-04-22 DIAGNOSIS — E119 Type 2 diabetes mellitus without complications: Secondary | ICD-10-CM | POA: Diagnosis not present

## 2016-04-22 DIAGNOSIS — K219 Gastro-esophageal reflux disease without esophagitis: Secondary | ICD-10-CM | POA: Diagnosis not present

## 2016-04-22 DIAGNOSIS — R278 Other lack of coordination: Secondary | ICD-10-CM | POA: Diagnosis not present

## 2016-04-22 DIAGNOSIS — I350 Nonrheumatic aortic (valve) stenosis: Secondary | ICD-10-CM | POA: Diagnosis not present

## 2016-04-22 DIAGNOSIS — C61 Malignant neoplasm of prostate: Secondary | ICD-10-CM | POA: Diagnosis not present

## 2016-04-22 DIAGNOSIS — D631 Anemia in chronic kidney disease: Secondary | ICD-10-CM | POA: Diagnosis not present

## 2016-04-22 DIAGNOSIS — E785 Hyperlipidemia, unspecified: Secondary | ICD-10-CM | POA: Diagnosis not present

## 2016-04-22 DIAGNOSIS — N390 Urinary tract infection, site not specified: Secondary | ICD-10-CM | POA: Diagnosis not present

## 2016-04-27 DIAGNOSIS — Z8701 Personal history of pneumonia (recurrent): Secondary | ICD-10-CM | POA: Diagnosis not present

## 2016-04-27 DIAGNOSIS — I509 Heart failure, unspecified: Secondary | ICD-10-CM | POA: Diagnosis not present

## 2016-04-27 DIAGNOSIS — D62 Acute posthemorrhagic anemia: Secondary | ICD-10-CM | POA: Diagnosis not present

## 2016-04-27 DIAGNOSIS — E119 Type 2 diabetes mellitus without complications: Secondary | ICD-10-CM | POA: Diagnosis not present

## 2016-04-27 DIAGNOSIS — T83518D Infection and inflammatory reaction due to other urinary catheter, subsequent encounter: Secondary | ICD-10-CM | POA: Diagnosis not present

## 2016-04-27 DIAGNOSIS — N319 Neuromuscular dysfunction of bladder, unspecified: Secondary | ICD-10-CM | POA: Diagnosis not present

## 2016-05-04 DIAGNOSIS — E119 Type 2 diabetes mellitus without complications: Secondary | ICD-10-CM | POA: Diagnosis not present

## 2016-05-04 DIAGNOSIS — T83518D Infection and inflammatory reaction due to other urinary catheter, subsequent encounter: Secondary | ICD-10-CM | POA: Diagnosis not present

## 2016-05-04 DIAGNOSIS — N319 Neuromuscular dysfunction of bladder, unspecified: Secondary | ICD-10-CM | POA: Diagnosis not present

## 2016-05-04 DIAGNOSIS — Z8701 Personal history of pneumonia (recurrent): Secondary | ICD-10-CM | POA: Diagnosis not present

## 2016-05-04 DIAGNOSIS — D62 Acute posthemorrhagic anemia: Secondary | ICD-10-CM | POA: Diagnosis not present

## 2016-05-04 DIAGNOSIS — I509 Heart failure, unspecified: Secondary | ICD-10-CM | POA: Diagnosis not present

## 2016-05-10 DIAGNOSIS — T83518D Infection and inflammatory reaction due to other urinary catheter, subsequent encounter: Secondary | ICD-10-CM | POA: Diagnosis not present

## 2016-05-10 DIAGNOSIS — D62 Acute posthemorrhagic anemia: Secondary | ICD-10-CM | POA: Diagnosis not present

## 2016-05-10 DIAGNOSIS — I509 Heart failure, unspecified: Secondary | ICD-10-CM | POA: Diagnosis not present

## 2016-05-10 DIAGNOSIS — N319 Neuromuscular dysfunction of bladder, unspecified: Secondary | ICD-10-CM | POA: Diagnosis not present

## 2016-05-10 DIAGNOSIS — Z8701 Personal history of pneumonia (recurrent): Secondary | ICD-10-CM | POA: Diagnosis not present

## 2016-05-10 DIAGNOSIS — E119 Type 2 diabetes mellitus without complications: Secondary | ICD-10-CM | POA: Diagnosis not present

## 2016-05-17 DIAGNOSIS — D62 Acute posthemorrhagic anemia: Secondary | ICD-10-CM | POA: Diagnosis not present

## 2016-05-17 DIAGNOSIS — E119 Type 2 diabetes mellitus without complications: Secondary | ICD-10-CM | POA: Diagnosis not present

## 2016-05-17 DIAGNOSIS — T83518D Infection and inflammatory reaction due to other urinary catheter, subsequent encounter: Secondary | ICD-10-CM | POA: Diagnosis not present

## 2016-05-17 DIAGNOSIS — Z8701 Personal history of pneumonia (recurrent): Secondary | ICD-10-CM | POA: Diagnosis not present

## 2016-05-17 DIAGNOSIS — N319 Neuromuscular dysfunction of bladder, unspecified: Secondary | ICD-10-CM | POA: Diagnosis not present

## 2016-05-17 DIAGNOSIS — I509 Heart failure, unspecified: Secondary | ICD-10-CM | POA: Diagnosis not present

## 2016-05-25 DIAGNOSIS — D62 Acute posthemorrhagic anemia: Secondary | ICD-10-CM | POA: Diagnosis not present

## 2016-05-25 DIAGNOSIS — E119 Type 2 diabetes mellitus without complications: Secondary | ICD-10-CM | POA: Diagnosis not present

## 2016-05-25 DIAGNOSIS — Z8701 Personal history of pneumonia (recurrent): Secondary | ICD-10-CM | POA: Diagnosis not present

## 2016-05-25 DIAGNOSIS — N319 Neuromuscular dysfunction of bladder, unspecified: Secondary | ICD-10-CM | POA: Diagnosis not present

## 2016-05-25 DIAGNOSIS — T83518D Infection and inflammatory reaction due to other urinary catheter, subsequent encounter: Secondary | ICD-10-CM | POA: Diagnosis not present

## 2016-05-25 DIAGNOSIS — I509 Heart failure, unspecified: Secondary | ICD-10-CM | POA: Diagnosis not present

## 2016-06-10 DIAGNOSIS — D62 Acute posthemorrhagic anemia: Secondary | ICD-10-CM | POA: Diagnosis not present

## 2016-06-10 DIAGNOSIS — T83518D Infection and inflammatory reaction due to other urinary catheter, subsequent encounter: Secondary | ICD-10-CM | POA: Diagnosis not present

## 2016-06-10 DIAGNOSIS — N319 Neuromuscular dysfunction of bladder, unspecified: Secondary | ICD-10-CM | POA: Diagnosis not present

## 2016-06-10 DIAGNOSIS — E119 Type 2 diabetes mellitus without complications: Secondary | ICD-10-CM | POA: Diagnosis not present

## 2016-06-10 DIAGNOSIS — I509 Heart failure, unspecified: Secondary | ICD-10-CM | POA: Diagnosis not present

## 2016-06-10 DIAGNOSIS — Z8701 Personal history of pneumonia (recurrent): Secondary | ICD-10-CM | POA: Diagnosis not present

## 2016-06-17 DIAGNOSIS — D62 Acute posthemorrhagic anemia: Secondary | ICD-10-CM | POA: Diagnosis not present

## 2016-06-17 DIAGNOSIS — I509 Heart failure, unspecified: Secondary | ICD-10-CM | POA: Diagnosis not present

## 2016-06-17 DIAGNOSIS — E119 Type 2 diabetes mellitus without complications: Secondary | ICD-10-CM | POA: Diagnosis not present

## 2016-06-17 DIAGNOSIS — T83518D Infection and inflammatory reaction due to other urinary catheter, subsequent encounter: Secondary | ICD-10-CM | POA: Diagnosis not present

## 2016-06-17 DIAGNOSIS — Z8701 Personal history of pneumonia (recurrent): Secondary | ICD-10-CM | POA: Diagnosis not present

## 2016-06-17 DIAGNOSIS — N319 Neuromuscular dysfunction of bladder, unspecified: Secondary | ICD-10-CM | POA: Diagnosis not present

## 2016-07-15 DIAGNOSIS — Z466 Encounter for fitting and adjustment of urinary device: Secondary | ICD-10-CM | POA: Diagnosis not present

## 2016-07-15 DIAGNOSIS — I350 Nonrheumatic aortic (valve) stenosis: Secondary | ICD-10-CM | POA: Diagnosis not present

## 2016-07-15 DIAGNOSIS — E119 Type 2 diabetes mellitus without complications: Secondary | ICD-10-CM | POA: Diagnosis not present

## 2016-07-15 DIAGNOSIS — I482 Chronic atrial fibrillation: Secondary | ICD-10-CM | POA: Diagnosis not present

## 2016-07-15 DIAGNOSIS — I509 Heart failure, unspecified: Secondary | ICD-10-CM | POA: Diagnosis not present

## 2016-07-15 DIAGNOSIS — N319 Neuromuscular dysfunction of bladder, unspecified: Secondary | ICD-10-CM | POA: Diagnosis not present

## 2016-07-18 DIAGNOSIS — E119 Type 2 diabetes mellitus without complications: Secondary | ICD-10-CM | POA: Diagnosis not present

## 2016-07-18 DIAGNOSIS — N319 Neuromuscular dysfunction of bladder, unspecified: Secondary | ICD-10-CM | POA: Diagnosis not present

## 2016-07-18 DIAGNOSIS — I350 Nonrheumatic aortic (valve) stenosis: Secondary | ICD-10-CM | POA: Diagnosis not present

## 2016-07-18 DIAGNOSIS — I482 Chronic atrial fibrillation: Secondary | ICD-10-CM | POA: Diagnosis not present

## 2016-07-18 DIAGNOSIS — I509 Heart failure, unspecified: Secondary | ICD-10-CM | POA: Diagnosis not present

## 2016-07-18 DIAGNOSIS — Z466 Encounter for fitting and adjustment of urinary device: Secondary | ICD-10-CM | POA: Diagnosis not present

## 2016-07-26 DIAGNOSIS — N390 Urinary tract infection, site not specified: Secondary | ICD-10-CM | POA: Diagnosis not present

## 2016-08-12 DIAGNOSIS — I509 Heart failure, unspecified: Secondary | ICD-10-CM | POA: Diagnosis not present

## 2016-08-12 DIAGNOSIS — Z466 Encounter for fitting and adjustment of urinary device: Secondary | ICD-10-CM | POA: Diagnosis not present

## 2016-08-12 DIAGNOSIS — I482 Chronic atrial fibrillation: Secondary | ICD-10-CM | POA: Diagnosis not present

## 2016-08-12 DIAGNOSIS — E119 Type 2 diabetes mellitus without complications: Secondary | ICD-10-CM | POA: Diagnosis not present

## 2016-08-12 DIAGNOSIS — I350 Nonrheumatic aortic (valve) stenosis: Secondary | ICD-10-CM | POA: Diagnosis not present

## 2016-08-12 DIAGNOSIS — N319 Neuromuscular dysfunction of bladder, unspecified: Secondary | ICD-10-CM | POA: Diagnosis not present

## 2016-08-24 DIAGNOSIS — N319 Neuromuscular dysfunction of bladder, unspecified: Secondary | ICD-10-CM | POA: Diagnosis not present

## 2016-08-24 DIAGNOSIS — E119 Type 2 diabetes mellitus without complications: Secondary | ICD-10-CM | POA: Diagnosis not present

## 2016-08-24 DIAGNOSIS — I350 Nonrheumatic aortic (valve) stenosis: Secondary | ICD-10-CM | POA: Diagnosis not present

## 2016-08-24 DIAGNOSIS — Z466 Encounter for fitting and adjustment of urinary device: Secondary | ICD-10-CM | POA: Diagnosis not present

## 2016-08-24 DIAGNOSIS — I482 Chronic atrial fibrillation: Secondary | ICD-10-CM | POA: Diagnosis not present

## 2016-08-24 DIAGNOSIS — I509 Heart failure, unspecified: Secondary | ICD-10-CM | POA: Diagnosis not present

## 2016-08-26 DIAGNOSIS — I482 Chronic atrial fibrillation: Secondary | ICD-10-CM | POA: Diagnosis not present

## 2016-08-26 DIAGNOSIS — N319 Neuromuscular dysfunction of bladder, unspecified: Secondary | ICD-10-CM | POA: Diagnosis not present

## 2016-08-26 DIAGNOSIS — I350 Nonrheumatic aortic (valve) stenosis: Secondary | ICD-10-CM | POA: Diagnosis not present

## 2016-08-26 DIAGNOSIS — I509 Heart failure, unspecified: Secondary | ICD-10-CM | POA: Diagnosis not present

## 2016-08-26 DIAGNOSIS — Z466 Encounter for fitting and adjustment of urinary device: Secondary | ICD-10-CM | POA: Diagnosis not present

## 2016-08-26 DIAGNOSIS — E119 Type 2 diabetes mellitus without complications: Secondary | ICD-10-CM | POA: Diagnosis not present

## 2016-09-16 DIAGNOSIS — Z466 Encounter for fitting and adjustment of urinary device: Secondary | ICD-10-CM | POA: Diagnosis not present

## 2016-09-16 DIAGNOSIS — N319 Neuromuscular dysfunction of bladder, unspecified: Secondary | ICD-10-CM | POA: Diagnosis not present

## 2016-09-16 DIAGNOSIS — I509 Heart failure, unspecified: Secondary | ICD-10-CM | POA: Diagnosis not present

## 2016-09-16 DIAGNOSIS — I350 Nonrheumatic aortic (valve) stenosis: Secondary | ICD-10-CM | POA: Diagnosis not present

## 2016-09-16 DIAGNOSIS — E119 Type 2 diabetes mellitus without complications: Secondary | ICD-10-CM | POA: Diagnosis not present

## 2016-09-16 DIAGNOSIS — I482 Chronic atrial fibrillation: Secondary | ICD-10-CM | POA: Diagnosis not present

## 2016-10-12 DIAGNOSIS — M6281 Muscle weakness (generalized): Secondary | ICD-10-CM | POA: Diagnosis not present

## 2016-10-12 DIAGNOSIS — R2689 Other abnormalities of gait and mobility: Secondary | ICD-10-CM | POA: Diagnosis not present

## 2016-10-12 DIAGNOSIS — R2681 Unsteadiness on feet: Secondary | ICD-10-CM | POA: Diagnosis not present

## 2016-10-14 DIAGNOSIS — M6281 Muscle weakness (generalized): Secondary | ICD-10-CM | POA: Diagnosis not present

## 2016-10-14 DIAGNOSIS — R2681 Unsteadiness on feet: Secondary | ICD-10-CM | POA: Diagnosis not present

## 2016-10-14 DIAGNOSIS — R2689 Other abnormalities of gait and mobility: Secondary | ICD-10-CM | POA: Diagnosis not present

## 2016-10-17 DIAGNOSIS — I509 Heart failure, unspecified: Secondary | ICD-10-CM | POA: Diagnosis not present

## 2016-10-17 DIAGNOSIS — E119 Type 2 diabetes mellitus without complications: Secondary | ICD-10-CM | POA: Diagnosis not present

## 2016-10-17 DIAGNOSIS — I482 Chronic atrial fibrillation: Secondary | ICD-10-CM | POA: Diagnosis not present

## 2016-10-17 DIAGNOSIS — N319 Neuromuscular dysfunction of bladder, unspecified: Secondary | ICD-10-CM | POA: Diagnosis not present

## 2016-10-17 DIAGNOSIS — Z466 Encounter for fitting and adjustment of urinary device: Secondary | ICD-10-CM | POA: Diagnosis not present

## 2016-10-17 DIAGNOSIS — I350 Nonrheumatic aortic (valve) stenosis: Secondary | ICD-10-CM | POA: Diagnosis not present

## 2016-10-18 DIAGNOSIS — R2681 Unsteadiness on feet: Secondary | ICD-10-CM | POA: Diagnosis not present

## 2016-10-18 DIAGNOSIS — M6281 Muscle weakness (generalized): Secondary | ICD-10-CM | POA: Diagnosis not present

## 2016-10-18 DIAGNOSIS — R2689 Other abnormalities of gait and mobility: Secondary | ICD-10-CM | POA: Diagnosis not present

## 2016-10-20 DIAGNOSIS — I509 Heart failure, unspecified: Secondary | ICD-10-CM | POA: Diagnosis not present

## 2016-10-20 DIAGNOSIS — Z466 Encounter for fitting and adjustment of urinary device: Secondary | ICD-10-CM | POA: Diagnosis not present

## 2016-10-20 DIAGNOSIS — M6281 Muscle weakness (generalized): Secondary | ICD-10-CM | POA: Diagnosis not present

## 2016-10-20 DIAGNOSIS — I482 Chronic atrial fibrillation: Secondary | ICD-10-CM | POA: Diagnosis not present

## 2016-10-20 DIAGNOSIS — R2681 Unsteadiness on feet: Secondary | ICD-10-CM | POA: Diagnosis not present

## 2016-10-20 DIAGNOSIS — N319 Neuromuscular dysfunction of bladder, unspecified: Secondary | ICD-10-CM | POA: Diagnosis not present

## 2016-10-20 DIAGNOSIS — E119 Type 2 diabetes mellitus without complications: Secondary | ICD-10-CM | POA: Diagnosis not present

## 2016-10-20 DIAGNOSIS — I350 Nonrheumatic aortic (valve) stenosis: Secondary | ICD-10-CM | POA: Diagnosis not present

## 2016-10-20 DIAGNOSIS — R2689 Other abnormalities of gait and mobility: Secondary | ICD-10-CM | POA: Diagnosis not present

## 2016-10-21 DIAGNOSIS — R2681 Unsteadiness on feet: Secondary | ICD-10-CM | POA: Diagnosis not present

## 2016-10-21 DIAGNOSIS — R2689 Other abnormalities of gait and mobility: Secondary | ICD-10-CM | POA: Diagnosis not present

## 2016-10-21 DIAGNOSIS — M6281 Muscle weakness (generalized): Secondary | ICD-10-CM | POA: Diagnosis not present

## 2016-10-24 DIAGNOSIS — R2689 Other abnormalities of gait and mobility: Secondary | ICD-10-CM | POA: Diagnosis not present

## 2016-10-24 DIAGNOSIS — M6281 Muscle weakness (generalized): Secondary | ICD-10-CM | POA: Diagnosis not present

## 2016-10-24 DIAGNOSIS — R2681 Unsteadiness on feet: Secondary | ICD-10-CM | POA: Diagnosis not present

## 2016-10-25 DIAGNOSIS — M6281 Muscle weakness (generalized): Secondary | ICD-10-CM | POA: Diagnosis not present

## 2016-10-25 DIAGNOSIS — R2681 Unsteadiness on feet: Secondary | ICD-10-CM | POA: Diagnosis not present

## 2016-10-25 DIAGNOSIS — R2689 Other abnormalities of gait and mobility: Secondary | ICD-10-CM | POA: Diagnosis not present

## 2016-10-27 DIAGNOSIS — M6281 Muscle weakness (generalized): Secondary | ICD-10-CM | POA: Diagnosis not present

## 2016-10-27 DIAGNOSIS — R2681 Unsteadiness on feet: Secondary | ICD-10-CM | POA: Diagnosis not present

## 2016-10-27 DIAGNOSIS — R2689 Other abnormalities of gait and mobility: Secondary | ICD-10-CM | POA: Diagnosis not present

## 2016-10-28 DIAGNOSIS — R2689 Other abnormalities of gait and mobility: Secondary | ICD-10-CM | POA: Diagnosis not present

## 2016-10-28 DIAGNOSIS — R2681 Unsteadiness on feet: Secondary | ICD-10-CM | POA: Diagnosis not present

## 2016-10-28 DIAGNOSIS — M6281 Muscle weakness (generalized): Secondary | ICD-10-CM | POA: Diagnosis not present

## 2016-10-31 DIAGNOSIS — R2689 Other abnormalities of gait and mobility: Secondary | ICD-10-CM | POA: Diagnosis not present

## 2016-10-31 DIAGNOSIS — M6281 Muscle weakness (generalized): Secondary | ICD-10-CM | POA: Diagnosis not present

## 2016-10-31 DIAGNOSIS — R2681 Unsteadiness on feet: Secondary | ICD-10-CM | POA: Diagnosis not present

## 2016-11-01 DIAGNOSIS — R2689 Other abnormalities of gait and mobility: Secondary | ICD-10-CM | POA: Diagnosis not present

## 2016-11-01 DIAGNOSIS — R2681 Unsteadiness on feet: Secondary | ICD-10-CM | POA: Diagnosis not present

## 2016-11-01 DIAGNOSIS — M6281 Muscle weakness (generalized): Secondary | ICD-10-CM | POA: Diagnosis not present

## 2016-11-03 DIAGNOSIS — R2681 Unsteadiness on feet: Secondary | ICD-10-CM | POA: Diagnosis not present

## 2016-11-03 DIAGNOSIS — R2689 Other abnormalities of gait and mobility: Secondary | ICD-10-CM | POA: Diagnosis not present

## 2016-11-03 DIAGNOSIS — M6281 Muscle weakness (generalized): Secondary | ICD-10-CM | POA: Diagnosis not present

## 2016-11-04 DIAGNOSIS — M6281 Muscle weakness (generalized): Secondary | ICD-10-CM | POA: Diagnosis not present

## 2016-11-04 DIAGNOSIS — R2689 Other abnormalities of gait and mobility: Secondary | ICD-10-CM | POA: Diagnosis not present

## 2016-11-04 DIAGNOSIS — R2681 Unsteadiness on feet: Secondary | ICD-10-CM | POA: Diagnosis not present

## 2016-11-07 DIAGNOSIS — R2681 Unsteadiness on feet: Secondary | ICD-10-CM | POA: Diagnosis not present

## 2016-11-07 DIAGNOSIS — R2689 Other abnormalities of gait and mobility: Secondary | ICD-10-CM | POA: Diagnosis not present

## 2016-11-07 DIAGNOSIS — M6281 Muscle weakness (generalized): Secondary | ICD-10-CM | POA: Diagnosis not present

## 2016-11-08 DIAGNOSIS — R2681 Unsteadiness on feet: Secondary | ICD-10-CM | POA: Diagnosis not present

## 2016-11-08 DIAGNOSIS — M6281 Muscle weakness (generalized): Secondary | ICD-10-CM | POA: Diagnosis not present

## 2016-11-08 DIAGNOSIS — R2689 Other abnormalities of gait and mobility: Secondary | ICD-10-CM | POA: Diagnosis not present

## 2016-11-10 DIAGNOSIS — R2681 Unsteadiness on feet: Secondary | ICD-10-CM | POA: Diagnosis not present

## 2016-11-10 DIAGNOSIS — R2689 Other abnormalities of gait and mobility: Secondary | ICD-10-CM | POA: Diagnosis not present

## 2016-11-10 DIAGNOSIS — M6281 Muscle weakness (generalized): Secondary | ICD-10-CM | POA: Diagnosis not present

## 2016-11-11 DIAGNOSIS — R2689 Other abnormalities of gait and mobility: Secondary | ICD-10-CM | POA: Diagnosis not present

## 2016-11-11 DIAGNOSIS — M6281 Muscle weakness (generalized): Secondary | ICD-10-CM | POA: Diagnosis not present

## 2016-11-11 DIAGNOSIS — R2681 Unsteadiness on feet: Secondary | ICD-10-CM | POA: Diagnosis not present

## 2016-11-15 DIAGNOSIS — M6281 Muscle weakness (generalized): Secondary | ICD-10-CM | POA: Diagnosis not present

## 2016-11-15 DIAGNOSIS — R2681 Unsteadiness on feet: Secondary | ICD-10-CM | POA: Diagnosis not present

## 2016-11-15 DIAGNOSIS — R2689 Other abnormalities of gait and mobility: Secondary | ICD-10-CM | POA: Diagnosis not present

## 2016-11-18 DIAGNOSIS — Z466 Encounter for fitting and adjustment of urinary device: Secondary | ICD-10-CM | POA: Diagnosis not present

## 2016-11-18 DIAGNOSIS — E119 Type 2 diabetes mellitus without complications: Secondary | ICD-10-CM | POA: Diagnosis not present

## 2016-11-18 DIAGNOSIS — I482 Chronic atrial fibrillation: Secondary | ICD-10-CM | POA: Diagnosis not present

## 2016-11-18 DIAGNOSIS — N319 Neuromuscular dysfunction of bladder, unspecified: Secondary | ICD-10-CM | POA: Diagnosis not present

## 2016-11-18 DIAGNOSIS — I509 Heart failure, unspecified: Secondary | ICD-10-CM | POA: Diagnosis not present

## 2016-11-18 DIAGNOSIS — I350 Nonrheumatic aortic (valve) stenosis: Secondary | ICD-10-CM | POA: Diagnosis not present

## 2016-11-22 DIAGNOSIS — Z466 Encounter for fitting and adjustment of urinary device: Secondary | ICD-10-CM | POA: Diagnosis not present

## 2016-11-22 DIAGNOSIS — I509 Heart failure, unspecified: Secondary | ICD-10-CM | POA: Diagnosis not present

## 2016-11-22 DIAGNOSIS — I350 Nonrheumatic aortic (valve) stenosis: Secondary | ICD-10-CM | POA: Diagnosis not present

## 2016-11-22 DIAGNOSIS — I482 Chronic atrial fibrillation: Secondary | ICD-10-CM | POA: Diagnosis not present

## 2016-11-22 DIAGNOSIS — E119 Type 2 diabetes mellitus without complications: Secondary | ICD-10-CM | POA: Diagnosis not present

## 2016-11-22 DIAGNOSIS — N319 Neuromuscular dysfunction of bladder, unspecified: Secondary | ICD-10-CM | POA: Diagnosis not present

## 2016-11-23 DIAGNOSIS — Z466 Encounter for fitting and adjustment of urinary device: Secondary | ICD-10-CM | POA: Diagnosis not present

## 2016-11-23 DIAGNOSIS — I482 Chronic atrial fibrillation: Secondary | ICD-10-CM | POA: Diagnosis not present

## 2016-11-23 DIAGNOSIS — N319 Neuromuscular dysfunction of bladder, unspecified: Secondary | ICD-10-CM | POA: Diagnosis not present

## 2016-11-23 DIAGNOSIS — I509 Heart failure, unspecified: Secondary | ICD-10-CM | POA: Diagnosis not present

## 2016-11-23 DIAGNOSIS — I350 Nonrheumatic aortic (valve) stenosis: Secondary | ICD-10-CM | POA: Diagnosis not present

## 2016-11-23 DIAGNOSIS — E119 Type 2 diabetes mellitus without complications: Secondary | ICD-10-CM | POA: Diagnosis not present

## 2016-11-28 DIAGNOSIS — I509 Heart failure, unspecified: Secondary | ICD-10-CM | POA: Diagnosis not present

## 2016-11-28 DIAGNOSIS — I350 Nonrheumatic aortic (valve) stenosis: Secondary | ICD-10-CM | POA: Diagnosis not present

## 2016-11-28 DIAGNOSIS — E119 Type 2 diabetes mellitus without complications: Secondary | ICD-10-CM | POA: Diagnosis not present

## 2016-11-28 DIAGNOSIS — Z466 Encounter for fitting and adjustment of urinary device: Secondary | ICD-10-CM | POA: Diagnosis not present

## 2016-11-28 DIAGNOSIS — N319 Neuromuscular dysfunction of bladder, unspecified: Secondary | ICD-10-CM | POA: Diagnosis not present

## 2016-11-28 DIAGNOSIS — I482 Chronic atrial fibrillation: Secondary | ICD-10-CM | POA: Diagnosis not present

## 2016-11-30 DIAGNOSIS — E119 Type 2 diabetes mellitus without complications: Secondary | ICD-10-CM | POA: Diagnosis not present

## 2016-11-30 DIAGNOSIS — I350 Nonrheumatic aortic (valve) stenosis: Secondary | ICD-10-CM | POA: Diagnosis not present

## 2016-11-30 DIAGNOSIS — N319 Neuromuscular dysfunction of bladder, unspecified: Secondary | ICD-10-CM | POA: Diagnosis not present

## 2016-11-30 DIAGNOSIS — I509 Heart failure, unspecified: Secondary | ICD-10-CM | POA: Diagnosis not present

## 2016-11-30 DIAGNOSIS — I482 Chronic atrial fibrillation: Secondary | ICD-10-CM | POA: Diagnosis not present

## 2016-11-30 DIAGNOSIS — Z466 Encounter for fitting and adjustment of urinary device: Secondary | ICD-10-CM | POA: Diagnosis not present

## 2016-12-01 DIAGNOSIS — Z466 Encounter for fitting and adjustment of urinary device: Secondary | ICD-10-CM | POA: Diagnosis not present

## 2016-12-01 DIAGNOSIS — E119 Type 2 diabetes mellitus without complications: Secondary | ICD-10-CM | POA: Diagnosis not present

## 2016-12-01 DIAGNOSIS — I509 Heart failure, unspecified: Secondary | ICD-10-CM | POA: Diagnosis not present

## 2016-12-01 DIAGNOSIS — I350 Nonrheumatic aortic (valve) stenosis: Secondary | ICD-10-CM | POA: Diagnosis not present

## 2016-12-01 DIAGNOSIS — N319 Neuromuscular dysfunction of bladder, unspecified: Secondary | ICD-10-CM | POA: Diagnosis not present

## 2016-12-01 DIAGNOSIS — I482 Chronic atrial fibrillation: Secondary | ICD-10-CM | POA: Diagnosis not present

## 2016-12-05 DIAGNOSIS — I509 Heart failure, unspecified: Secondary | ICD-10-CM | POA: Diagnosis not present

## 2016-12-05 DIAGNOSIS — E119 Type 2 diabetes mellitus without complications: Secondary | ICD-10-CM | POA: Diagnosis not present

## 2016-12-05 DIAGNOSIS — N319 Neuromuscular dysfunction of bladder, unspecified: Secondary | ICD-10-CM | POA: Diagnosis not present

## 2016-12-05 DIAGNOSIS — I350 Nonrheumatic aortic (valve) stenosis: Secondary | ICD-10-CM | POA: Diagnosis not present

## 2016-12-05 DIAGNOSIS — I482 Chronic atrial fibrillation: Secondary | ICD-10-CM | POA: Diagnosis not present

## 2016-12-05 DIAGNOSIS — Z466 Encounter for fitting and adjustment of urinary device: Secondary | ICD-10-CM | POA: Diagnosis not present

## 2016-12-07 DIAGNOSIS — I509 Heart failure, unspecified: Secondary | ICD-10-CM | POA: Diagnosis not present

## 2016-12-07 DIAGNOSIS — E119 Type 2 diabetes mellitus without complications: Secondary | ICD-10-CM | POA: Diagnosis not present

## 2016-12-07 DIAGNOSIS — I482 Chronic atrial fibrillation: Secondary | ICD-10-CM | POA: Diagnosis not present

## 2016-12-07 DIAGNOSIS — I350 Nonrheumatic aortic (valve) stenosis: Secondary | ICD-10-CM | POA: Diagnosis not present

## 2016-12-07 DIAGNOSIS — Z466 Encounter for fitting and adjustment of urinary device: Secondary | ICD-10-CM | POA: Diagnosis not present

## 2016-12-07 DIAGNOSIS — N319 Neuromuscular dysfunction of bladder, unspecified: Secondary | ICD-10-CM | POA: Diagnosis not present

## 2016-12-09 DIAGNOSIS — I509 Heart failure, unspecified: Secondary | ICD-10-CM | POA: Diagnosis not present

## 2016-12-09 DIAGNOSIS — N319 Neuromuscular dysfunction of bladder, unspecified: Secondary | ICD-10-CM | POA: Diagnosis not present

## 2016-12-09 DIAGNOSIS — E119 Type 2 diabetes mellitus without complications: Secondary | ICD-10-CM | POA: Diagnosis not present

## 2016-12-09 DIAGNOSIS — I482 Chronic atrial fibrillation: Secondary | ICD-10-CM | POA: Diagnosis not present

## 2016-12-09 DIAGNOSIS — I350 Nonrheumatic aortic (valve) stenosis: Secondary | ICD-10-CM | POA: Diagnosis not present

## 2016-12-09 DIAGNOSIS — N390 Urinary tract infection, site not specified: Secondary | ICD-10-CM | POA: Diagnosis not present

## 2016-12-09 DIAGNOSIS — Z466 Encounter for fitting and adjustment of urinary device: Secondary | ICD-10-CM | POA: Diagnosis not present

## 2016-12-13 DIAGNOSIS — I509 Heart failure, unspecified: Secondary | ICD-10-CM | POA: Diagnosis not present

## 2016-12-13 DIAGNOSIS — N319 Neuromuscular dysfunction of bladder, unspecified: Secondary | ICD-10-CM | POA: Diagnosis not present

## 2016-12-13 DIAGNOSIS — I350 Nonrheumatic aortic (valve) stenosis: Secondary | ICD-10-CM | POA: Diagnosis not present

## 2016-12-13 DIAGNOSIS — Z466 Encounter for fitting and adjustment of urinary device: Secondary | ICD-10-CM | POA: Diagnosis not present

## 2016-12-13 DIAGNOSIS — E119 Type 2 diabetes mellitus without complications: Secondary | ICD-10-CM | POA: Diagnosis not present

## 2016-12-13 DIAGNOSIS — I482 Chronic atrial fibrillation: Secondary | ICD-10-CM | POA: Diagnosis not present

## 2016-12-15 DIAGNOSIS — E119 Type 2 diabetes mellitus without complications: Secondary | ICD-10-CM | POA: Diagnosis not present

## 2016-12-15 DIAGNOSIS — N319 Neuromuscular dysfunction of bladder, unspecified: Secondary | ICD-10-CM | POA: Diagnosis not present

## 2016-12-15 DIAGNOSIS — Z466 Encounter for fitting and adjustment of urinary device: Secondary | ICD-10-CM | POA: Diagnosis not present

## 2016-12-15 DIAGNOSIS — I350 Nonrheumatic aortic (valve) stenosis: Secondary | ICD-10-CM | POA: Diagnosis not present

## 2016-12-15 DIAGNOSIS — I482 Chronic atrial fibrillation: Secondary | ICD-10-CM | POA: Diagnosis not present

## 2016-12-15 DIAGNOSIS — I509 Heart failure, unspecified: Secondary | ICD-10-CM | POA: Diagnosis not present

## 2016-12-20 DIAGNOSIS — I350 Nonrheumatic aortic (valve) stenosis: Secondary | ICD-10-CM | POA: Diagnosis not present

## 2016-12-20 DIAGNOSIS — Z466 Encounter for fitting and adjustment of urinary device: Secondary | ICD-10-CM | POA: Diagnosis not present

## 2016-12-20 DIAGNOSIS — N319 Neuromuscular dysfunction of bladder, unspecified: Secondary | ICD-10-CM | POA: Diagnosis not present

## 2016-12-20 DIAGNOSIS — I482 Chronic atrial fibrillation: Secondary | ICD-10-CM | POA: Diagnosis not present

## 2016-12-20 DIAGNOSIS — E119 Type 2 diabetes mellitus without complications: Secondary | ICD-10-CM | POA: Diagnosis not present

## 2016-12-20 DIAGNOSIS — I509 Heart failure, unspecified: Secondary | ICD-10-CM | POA: Diagnosis not present

## 2016-12-21 DIAGNOSIS — E119 Type 2 diabetes mellitus without complications: Secondary | ICD-10-CM | POA: Diagnosis not present

## 2016-12-21 DIAGNOSIS — Z466 Encounter for fitting and adjustment of urinary device: Secondary | ICD-10-CM | POA: Diagnosis not present

## 2016-12-21 DIAGNOSIS — I509 Heart failure, unspecified: Secondary | ICD-10-CM | POA: Diagnosis not present

## 2016-12-21 DIAGNOSIS — N319 Neuromuscular dysfunction of bladder, unspecified: Secondary | ICD-10-CM | POA: Diagnosis not present

## 2016-12-21 DIAGNOSIS — I350 Nonrheumatic aortic (valve) stenosis: Secondary | ICD-10-CM | POA: Diagnosis not present

## 2016-12-21 DIAGNOSIS — I482 Chronic atrial fibrillation: Secondary | ICD-10-CM | POA: Diagnosis not present

## 2016-12-30 DIAGNOSIS — I509 Heart failure, unspecified: Secondary | ICD-10-CM | POA: Diagnosis not present

## 2016-12-30 DIAGNOSIS — Z466 Encounter for fitting and adjustment of urinary device: Secondary | ICD-10-CM | POA: Diagnosis not present

## 2016-12-30 DIAGNOSIS — I482 Chronic atrial fibrillation: Secondary | ICD-10-CM | POA: Diagnosis not present

## 2016-12-30 DIAGNOSIS — I350 Nonrheumatic aortic (valve) stenosis: Secondary | ICD-10-CM | POA: Diagnosis not present

## 2016-12-30 DIAGNOSIS — N319 Neuromuscular dysfunction of bladder, unspecified: Secondary | ICD-10-CM | POA: Diagnosis not present

## 2016-12-30 DIAGNOSIS — E119 Type 2 diabetes mellitus without complications: Secondary | ICD-10-CM | POA: Diagnosis not present

## 2017-01-04 DIAGNOSIS — I482 Chronic atrial fibrillation: Secondary | ICD-10-CM | POA: Diagnosis not present

## 2017-01-04 DIAGNOSIS — I350 Nonrheumatic aortic (valve) stenosis: Secondary | ICD-10-CM | POA: Diagnosis not present

## 2017-01-04 DIAGNOSIS — I509 Heart failure, unspecified: Secondary | ICD-10-CM | POA: Diagnosis not present

## 2017-01-04 DIAGNOSIS — N319 Neuromuscular dysfunction of bladder, unspecified: Secondary | ICD-10-CM | POA: Diagnosis not present

## 2017-01-04 DIAGNOSIS — Z466 Encounter for fitting and adjustment of urinary device: Secondary | ICD-10-CM | POA: Diagnosis not present

## 2017-01-04 DIAGNOSIS — E119 Type 2 diabetes mellitus without complications: Secondary | ICD-10-CM | POA: Diagnosis not present

## 2017-01-05 DIAGNOSIS — I509 Heart failure, unspecified: Secondary | ICD-10-CM | POA: Diagnosis not present

## 2017-01-05 DIAGNOSIS — I350 Nonrheumatic aortic (valve) stenosis: Secondary | ICD-10-CM | POA: Diagnosis not present

## 2017-01-05 DIAGNOSIS — Z466 Encounter for fitting and adjustment of urinary device: Secondary | ICD-10-CM | POA: Diagnosis not present

## 2017-01-05 DIAGNOSIS — E119 Type 2 diabetes mellitus without complications: Secondary | ICD-10-CM | POA: Diagnosis not present

## 2017-01-05 DIAGNOSIS — I482 Chronic atrial fibrillation: Secondary | ICD-10-CM | POA: Diagnosis not present

## 2017-01-05 DIAGNOSIS — N319 Neuromuscular dysfunction of bladder, unspecified: Secondary | ICD-10-CM | POA: Diagnosis not present

## 2017-01-09 DIAGNOSIS — I509 Heart failure, unspecified: Secondary | ICD-10-CM | POA: Diagnosis not present

## 2017-01-09 DIAGNOSIS — E119 Type 2 diabetes mellitus without complications: Secondary | ICD-10-CM | POA: Diagnosis not present

## 2017-01-09 DIAGNOSIS — N319 Neuromuscular dysfunction of bladder, unspecified: Secondary | ICD-10-CM | POA: Diagnosis not present

## 2017-01-09 DIAGNOSIS — I350 Nonrheumatic aortic (valve) stenosis: Secondary | ICD-10-CM | POA: Diagnosis not present

## 2017-01-09 DIAGNOSIS — Z466 Encounter for fitting and adjustment of urinary device: Secondary | ICD-10-CM | POA: Diagnosis not present

## 2017-01-09 DIAGNOSIS — I482 Chronic atrial fibrillation: Secondary | ICD-10-CM | POA: Diagnosis not present

## 2017-01-11 DIAGNOSIS — Z466 Encounter for fitting and adjustment of urinary device: Secondary | ICD-10-CM | POA: Diagnosis not present

## 2017-01-11 DIAGNOSIS — N319 Neuromuscular dysfunction of bladder, unspecified: Secondary | ICD-10-CM | POA: Diagnosis not present

## 2017-01-11 DIAGNOSIS — I509 Heart failure, unspecified: Secondary | ICD-10-CM | POA: Diagnosis not present

## 2017-01-11 DIAGNOSIS — I350 Nonrheumatic aortic (valve) stenosis: Secondary | ICD-10-CM | POA: Diagnosis not present

## 2017-01-11 DIAGNOSIS — E119 Type 2 diabetes mellitus without complications: Secondary | ICD-10-CM | POA: Diagnosis not present

## 2017-01-11 DIAGNOSIS — I482 Chronic atrial fibrillation: Secondary | ICD-10-CM | POA: Diagnosis not present

## 2017-01-16 DIAGNOSIS — I350 Nonrheumatic aortic (valve) stenosis: Secondary | ICD-10-CM | POA: Diagnosis not present

## 2017-01-16 DIAGNOSIS — Z466 Encounter for fitting and adjustment of urinary device: Secondary | ICD-10-CM | POA: Diagnosis not present

## 2017-01-16 DIAGNOSIS — I509 Heart failure, unspecified: Secondary | ICD-10-CM | POA: Diagnosis not present

## 2017-01-16 DIAGNOSIS — I482 Chronic atrial fibrillation: Secondary | ICD-10-CM | POA: Diagnosis not present

## 2017-01-16 DIAGNOSIS — E119 Type 2 diabetes mellitus without complications: Secondary | ICD-10-CM | POA: Diagnosis not present

## 2017-01-16 DIAGNOSIS — N319 Neuromuscular dysfunction of bladder, unspecified: Secondary | ICD-10-CM | POA: Diagnosis not present

## 2017-01-18 DIAGNOSIS — E119 Type 2 diabetes mellitus without complications: Secondary | ICD-10-CM | POA: Diagnosis not present

## 2017-01-18 DIAGNOSIS — N319 Neuromuscular dysfunction of bladder, unspecified: Secondary | ICD-10-CM | POA: Diagnosis not present

## 2017-01-18 DIAGNOSIS — Z466 Encounter for fitting and adjustment of urinary device: Secondary | ICD-10-CM | POA: Diagnosis not present

## 2017-01-18 DIAGNOSIS — I350 Nonrheumatic aortic (valve) stenosis: Secondary | ICD-10-CM | POA: Diagnosis not present

## 2017-01-18 DIAGNOSIS — I509 Heart failure, unspecified: Secondary | ICD-10-CM | POA: Diagnosis not present

## 2017-01-18 DIAGNOSIS — I482 Chronic atrial fibrillation: Secondary | ICD-10-CM | POA: Diagnosis not present

## 2017-01-19 DIAGNOSIS — I482 Chronic atrial fibrillation: Secondary | ICD-10-CM | POA: Diagnosis not present

## 2017-01-19 DIAGNOSIS — I509 Heart failure, unspecified: Secondary | ICD-10-CM | POA: Diagnosis not present

## 2017-01-19 DIAGNOSIS — I350 Nonrheumatic aortic (valve) stenosis: Secondary | ICD-10-CM | POA: Diagnosis not present

## 2017-01-19 DIAGNOSIS — Z466 Encounter for fitting and adjustment of urinary device: Secondary | ICD-10-CM | POA: Diagnosis not present

## 2017-01-19 DIAGNOSIS — N319 Neuromuscular dysfunction of bladder, unspecified: Secondary | ICD-10-CM | POA: Diagnosis not present

## 2017-01-19 DIAGNOSIS — E119 Type 2 diabetes mellitus without complications: Secondary | ICD-10-CM | POA: Diagnosis not present

## 2017-01-27 DIAGNOSIS — N319 Neuromuscular dysfunction of bladder, unspecified: Secondary | ICD-10-CM | POA: Diagnosis not present

## 2017-01-27 DIAGNOSIS — I482 Chronic atrial fibrillation: Secondary | ICD-10-CM | POA: Diagnosis not present

## 2017-01-27 DIAGNOSIS — Z466 Encounter for fitting and adjustment of urinary device: Secondary | ICD-10-CM | POA: Diagnosis not present

## 2017-01-27 DIAGNOSIS — I350 Nonrheumatic aortic (valve) stenosis: Secondary | ICD-10-CM | POA: Diagnosis not present

## 2017-01-27 DIAGNOSIS — I509 Heart failure, unspecified: Secondary | ICD-10-CM | POA: Diagnosis not present

## 2017-01-27 DIAGNOSIS — E119 Type 2 diabetes mellitus without complications: Secondary | ICD-10-CM | POA: Diagnosis not present

## 2017-02-01 ENCOUNTER — Inpatient Hospital Stay (HOSPITAL_COMMUNITY)
Admission: EM | Admit: 2017-02-01 | Discharge: 2017-03-03 | DRG: 698 | Disposition: E | Payer: Medicare HMO | Attending: Internal Medicine | Admitting: Internal Medicine

## 2017-02-01 ENCOUNTER — Encounter (HOSPITAL_COMMUNITY): Payer: Self-pay | Admitting: Family Medicine

## 2017-02-01 ENCOUNTER — Emergency Department (HOSPITAL_COMMUNITY): Payer: Medicare HMO

## 2017-02-01 DIAGNOSIS — G9340 Encephalopathy, unspecified: Secondary | ICD-10-CM | POA: Diagnosis not present

## 2017-02-01 DIAGNOSIS — E876 Hypokalemia: Secondary | ICD-10-CM | POA: Diagnosis not present

## 2017-02-01 DIAGNOSIS — Z8249 Family history of ischemic heart disease and other diseases of the circulatory system: Secondary | ICD-10-CM

## 2017-02-01 DIAGNOSIS — R410 Disorientation, unspecified: Secondary | ICD-10-CM | POA: Diagnosis not present

## 2017-02-01 DIAGNOSIS — T83511A Infection and inflammatory reaction due to indwelling urethral catheter, initial encounter: Principal | ICD-10-CM | POA: Diagnosis present

## 2017-02-01 DIAGNOSIS — Z993 Dependence on wheelchair: Secondary | ICD-10-CM | POA: Diagnosis not present

## 2017-02-01 DIAGNOSIS — G9341 Metabolic encephalopathy: Secondary | ICD-10-CM | POA: Diagnosis present

## 2017-02-01 DIAGNOSIS — Z79899 Other long term (current) drug therapy: Secondary | ICD-10-CM

## 2017-02-01 DIAGNOSIS — J690 Pneumonitis due to inhalation of food and vomit: Secondary | ICD-10-CM | POA: Diagnosis not present

## 2017-02-01 DIAGNOSIS — I4891 Unspecified atrial fibrillation: Secondary | ICD-10-CM

## 2017-02-01 DIAGNOSIS — E119 Type 2 diabetes mellitus without complications: Secondary | ICD-10-CM | POA: Diagnosis present

## 2017-02-01 DIAGNOSIS — J189 Pneumonia, unspecified organism: Secondary | ICD-10-CM | POA: Diagnosis present

## 2017-02-01 DIAGNOSIS — R0902 Hypoxemia: Secondary | ICD-10-CM | POA: Diagnosis not present

## 2017-02-01 DIAGNOSIS — Z515 Encounter for palliative care: Secondary | ICD-10-CM | POA: Diagnosis not present

## 2017-02-01 DIAGNOSIS — E782 Mixed hyperlipidemia: Secondary | ICD-10-CM | POA: Diagnosis present

## 2017-02-01 DIAGNOSIS — Z87440 Personal history of urinary (tract) infections: Secondary | ICD-10-CM

## 2017-02-01 DIAGNOSIS — E860 Dehydration: Secondary | ICD-10-CM | POA: Diagnosis present

## 2017-02-01 DIAGNOSIS — R4020 Unspecified coma: Secondary | ICD-10-CM | POA: Diagnosis not present

## 2017-02-01 DIAGNOSIS — Z7401 Bed confinement status: Secondary | ICD-10-CM | POA: Diagnosis not present

## 2017-02-01 DIAGNOSIS — R000 Tachycardia, unspecified: Secondary | ICD-10-CM | POA: Diagnosis not present

## 2017-02-01 DIAGNOSIS — N319 Neuromuscular dysfunction of bladder, unspecified: Secondary | ICD-10-CM | POA: Diagnosis present

## 2017-02-01 DIAGNOSIS — Z7984 Long term (current) use of oral hypoglycemic drugs: Secondary | ICD-10-CM

## 2017-02-01 DIAGNOSIS — R6510 Systemic inflammatory response syndrome (SIRS) of non-infectious origin without acute organ dysfunction: Secondary | ICD-10-CM | POA: Diagnosis not present

## 2017-02-01 DIAGNOSIS — I350 Nonrheumatic aortic (valve) stenosis: Secondary | ICD-10-CM | POA: Diagnosis not present

## 2017-02-01 DIAGNOSIS — I482 Chronic atrial fibrillation: Secondary | ICD-10-CM | POA: Diagnosis present

## 2017-02-01 DIAGNOSIS — A419 Sepsis, unspecified organism: Secondary | ICD-10-CM | POA: Diagnosis not present

## 2017-02-01 DIAGNOSIS — C61 Malignant neoplasm of prostate: Secondary | ICD-10-CM | POA: Diagnosis present

## 2017-02-01 DIAGNOSIS — K5900 Constipation, unspecified: Secondary | ICD-10-CM | POA: Diagnosis present

## 2017-02-01 DIAGNOSIS — N390 Urinary tract infection, site not specified: Secondary | ICD-10-CM | POA: Diagnosis not present

## 2017-02-01 DIAGNOSIS — J9601 Acute respiratory failure with hypoxia: Secondary | ICD-10-CM | POA: Diagnosis present

## 2017-02-01 DIAGNOSIS — R0602 Shortness of breath: Secondary | ICD-10-CM

## 2017-02-01 DIAGNOSIS — Z66 Do not resuscitate: Secondary | ICD-10-CM | POA: Diagnosis present

## 2017-02-01 DIAGNOSIS — N179 Acute kidney failure, unspecified: Secondary | ICD-10-CM | POA: Diagnosis not present

## 2017-02-01 DIAGNOSIS — Z87891 Personal history of nicotine dependence: Secondary | ICD-10-CM | POA: Diagnosis not present

## 2017-02-01 DIAGNOSIS — D72829 Elevated white blood cell count, unspecified: Secondary | ICD-10-CM | POA: Diagnosis not present

## 2017-02-01 DIAGNOSIS — R918 Other nonspecific abnormal finding of lung field: Secondary | ICD-10-CM | POA: Diagnosis not present

## 2017-02-01 DIAGNOSIS — R0789 Other chest pain: Secondary | ICD-10-CM | POA: Diagnosis not present

## 2017-02-01 LAB — CBC WITH DIFFERENTIAL/PLATELET
BASOS ABS: 0 10*3/uL (ref 0.0–0.1)
Basophils Relative: 0 %
EOS PCT: 0 %
Eosinophils Absolute: 0 10*3/uL (ref 0.0–0.7)
HEMATOCRIT: 39.4 % (ref 39.0–52.0)
HEMOGLOBIN: 13.1 g/dL (ref 13.0–17.0)
LYMPHS PCT: 6 %
Lymphs Abs: 1 10*3/uL (ref 0.7–4.0)
MCH: 32.3 pg (ref 26.0–34.0)
MCHC: 33.2 g/dL (ref 30.0–36.0)
MCV: 97.3 fL (ref 78.0–100.0)
Monocytes Absolute: 1.4 10*3/uL — ABNORMAL HIGH (ref 0.1–1.0)
Monocytes Relative: 8 %
NEUTROS ABS: 15 10*3/uL — AB (ref 1.7–7.7)
NEUTROS PCT: 86 %
PLATELETS: 267 10*3/uL (ref 150–400)
RBC: 4.05 MIL/uL — ABNORMAL LOW (ref 4.22–5.81)
RDW: 14.1 % (ref 11.5–15.5)
WBC: 17.4 10*3/uL — AB (ref 4.0–10.5)

## 2017-02-01 LAB — I-STAT CG4 LACTIC ACID, ED
Lactic Acid, Venous: 3.45 mmol/L (ref 0.5–1.9)
Lactic Acid, Venous: 5.03 mmol/L (ref 0.5–1.9)

## 2017-02-01 LAB — PROTIME-INR
INR: 1.08
Prothrombin Time: 13.9 seconds (ref 11.4–15.2)

## 2017-02-01 LAB — LIPASE, BLOOD: LIPASE: 25 U/L (ref 11–51)

## 2017-02-01 LAB — COMPREHENSIVE METABOLIC PANEL
ALBUMIN: 3.9 g/dL (ref 3.5–5.0)
ALT: 12 U/L — ABNORMAL LOW (ref 17–63)
ANION GAP: 15 (ref 5–15)
AST: 23 U/L (ref 15–41)
Alkaline Phosphatase: 111 U/L (ref 38–126)
BUN: 63 mg/dL — ABNORMAL HIGH (ref 6–20)
CHLORIDE: 95 mmol/L — AB (ref 101–111)
CO2: 26 mmol/L (ref 22–32)
Calcium: 8.8 mg/dL — ABNORMAL LOW (ref 8.9–10.3)
Creatinine, Ser: 2.54 mg/dL — ABNORMAL HIGH (ref 0.61–1.24)
GFR calc non Af Amer: 21 mL/min — ABNORMAL LOW (ref >60)
GFR, EST AFRICAN AMERICAN: 24 mL/min — AB (ref >60)
Glucose, Bld: 179 mg/dL — ABNORMAL HIGH (ref 65–99)
Potassium: 3.8 mmol/L (ref 3.5–5.1)
SODIUM: 136 mmol/L (ref 135–145)
Total Bilirubin: 0.7 mg/dL (ref 0.3–1.2)
Total Protein: 7.1 g/dL (ref 6.5–8.1)

## 2017-02-01 LAB — CBG MONITORING, ED: Glucose-Capillary: 165 mg/dL — ABNORMAL HIGH (ref 65–99)

## 2017-02-01 LAB — I-STAT TROPONIN, ED: Troponin i, poc: 0.01 ng/mL (ref 0.00–0.08)

## 2017-02-01 LAB — BRAIN NATRIURETIC PEPTIDE: B Natriuretic Peptide: 208.2 pg/mL — ABNORMAL HIGH (ref 0.0–100.0)

## 2017-02-01 MED ORDER — INSULIN ASPART 100 UNIT/ML ~~LOC~~ SOLN
0.0000 [IU] | Freq: Three times a day (TID) | SUBCUTANEOUS | Status: DC
  Administered 2017-02-02: 2 [IU] via SUBCUTANEOUS
  Administered 2017-02-02: 1 [IU] via SUBCUTANEOUS
  Administered 2017-02-02: 2 [IU] via SUBCUTANEOUS
  Administered 2017-02-03 – 2017-02-05 (×4): 1 [IU] via SUBCUTANEOUS

## 2017-02-01 MED ORDER — SODIUM CHLORIDE 0.9 % IV BOLUS (SEPSIS)
500.0000 mL | Freq: Once | INTRAVENOUS | Status: AC
  Administered 2017-02-01: 23:00:00 via INTRAVENOUS

## 2017-02-01 MED ORDER — DEXTROSE 5 % IV SOLN
1.0000 g | Freq: Three times a day (TID) | INTRAVENOUS | Status: DC
  Administered 2017-02-01: 1 g via INTRAVENOUS
  Filled 2017-02-01 (×2): qty 1

## 2017-02-01 MED ORDER — ENOXAPARIN SODIUM 40 MG/0.4ML ~~LOC~~ SOLN: 40.0000 mg | SUBCUTANEOUS | Status: DC

## 2017-02-01 MED ORDER — DILTIAZEM HCL 25 MG/5ML IV SOLN
5.0000 mg | Freq: Once | INTRAVENOUS | Status: AC
  Administered 2017-02-01: 5 mg via INTRAVENOUS
  Filled 2017-02-01: qty 5

## 2017-02-01 MED ORDER — PANTOPRAZOLE SODIUM 40 MG PO TBEC
40.0000 mg | DELAYED_RELEASE_TABLET | Freq: Every day | ORAL | Status: DC
  Administered 2017-02-02 – 2017-02-05 (×5): 40 mg via ORAL
  Filled 2017-02-01 (×5): qty 1

## 2017-02-01 MED ORDER — ONDANSETRON HCL 4 MG PO TABS: 4.0000 mg | ORAL_TABLET | Freq: Four times a day (QID) | ORAL | Status: DC | PRN

## 2017-02-01 MED ORDER — VANCOMYCIN HCL IN DEXTROSE 1-5 GM/200ML-% IV SOLN
1000.0000 mg | Freq: Once | INTRAVENOUS | Status: AC
  Administered 2017-02-02: 1000 mg via INTRAVENOUS
  Filled 2017-02-01: qty 200

## 2017-02-01 MED ORDER — PRAVASTATIN SODIUM 20 MG PO TABS
10.0000 mg | ORAL_TABLET | Freq: Every day | ORAL | Status: DC
  Administered 2017-02-02 – 2017-02-04 (×3): 10 mg via ORAL
  Filled 2017-02-01 (×3): qty 1

## 2017-02-01 MED ORDER — SODIUM CHLORIDE 0.9 % IV BOLUS (SEPSIS)
500.0000 mL | Freq: Once | INTRAVENOUS | Status: AC
  Administered 2017-02-01: 500 mL via INTRAVENOUS

## 2017-02-01 MED ORDER — AZITHROMYCIN 500 MG IV SOLR
500.0000 mg | Freq: Once | INTRAVENOUS | Status: AC
  Administered 2017-02-01: 500 mg via INTRAVENOUS
  Filled 2017-02-01: qty 500

## 2017-02-01 MED ORDER — POLYETHYLENE GLYCOL 3350 17 G PO PACK: 17.0000 g | PACK | Freq: Every day | ORAL | Status: DC | PRN

## 2017-02-01 MED ORDER — SODIUM CHLORIDE 0.9 % IV SOLN
Freq: Once | INTRAVENOUS | Status: AC
  Administered 2017-02-01: 23:00:00 via INTRAVENOUS

## 2017-02-01 MED ORDER — METOPROLOL SUCCINATE 12.5 MG HALF TABLET
12.5000 mg | ORAL_TABLET | Freq: Every day | ORAL | Status: DC
  Filled 2017-02-01: qty 1

## 2017-02-01 MED ORDER — FERROUS SULFATE 325 (65 FE) MG PO TABS
325.0000 mg | ORAL_TABLET | Freq: Two times a day (BID) | ORAL | Status: DC
  Administered 2017-02-02 – 2017-02-05 (×7): 325 mg via ORAL
  Filled 2017-02-01 (×7): qty 1

## 2017-02-01 MED ORDER — SENNOSIDES-DOCUSATE SODIUM 8.6-50 MG PO TABS: 1.0000 | ORAL_TABLET | Freq: Every evening | ORAL | Status: DC | PRN

## 2017-02-01 MED ORDER — CEFTRIAXONE SODIUM 1 G IJ SOLR
1.0000 g | Freq: Once | INTRAMUSCULAR | Status: AC
  Administered 2017-02-01: 1 g via INTRAVENOUS
  Filled 2017-02-01: qty 10

## 2017-02-01 MED ORDER — POLYVINYL ALCOHOL 1.4 % OP SOLN
1.0000 [drp] | Freq: Every day | OPHTHALMIC | Status: DC
  Administered 2017-02-03 – 2017-02-05 (×3): 1 [drp] via OPHTHALMIC
  Filled 2017-02-01 (×2): qty 15

## 2017-02-01 MED ORDER — ONDANSETRON HCL 4 MG/2ML IJ SOLN: 4.0000 mg | Freq: Four times a day (QID) | INTRAMUSCULAR | Status: DC | PRN

## 2017-02-01 MED ORDER — ACETAMINOPHEN 325 MG PO TABS: 650.0000 mg | ORAL_TABLET | Freq: Four times a day (QID) | ORAL | Status: DC | PRN

## 2017-02-01 MED ORDER — BICALUTAMIDE 50 MG PO TABS
50.0000 mg | ORAL_TABLET | Freq: Every day | ORAL | Status: DC
  Administered 2017-02-02 – 2017-02-04 (×3): 50 mg via ORAL
  Filled 2017-02-01 (×4): qty 1

## 2017-02-01 MED ORDER — ACETAMINOPHEN 650 MG RE SUPP: 650.0000 mg | Freq: Four times a day (QID) | RECTAL | Status: DC | PRN

## 2017-02-01 NOTE — ED Notes (Signed)
Will get LACTIC ACID at 20:00 since first lactic was drawn at 17:57.

## 2017-02-01 NOTE — ED Notes (Signed)
Patient's son reports the facility informed him he was experiencing shortness of breath. Patient reports he is labored breath and chest pain.

## 2017-02-01 NOTE — ED Notes (Signed)
No output noted in catheter bag at this time

## 2017-02-01 NOTE — ED Notes (Signed)
Pt given applesauce, crackers, and a ginger ale per hospitalist request.  Tolerating well.

## 2017-02-01 NOTE — ED Notes (Signed)
Placed patient on oxygen  at 2L.

## 2017-02-01 NOTE — ED Notes (Signed)
Changed leg bag so we can collect urine specimen

## 2017-02-01 NOTE — H&P (Signed)
History and Physical    Parker [name] HDQ:[id number] DOB: 03/25/1923 DOA: 01/05/2017  PCP: Seward Carol, MD  Patient coming from: Skilled nursing facility.  Chief Complaint: Altered mental status.  History obtained from patient's son and ER physician and previous records.  HPI: Parker [name] is a 84 y.o. male with history of atrial fibrillation, diabetes mellitus type 2, GI bleed, aortic stenosis and prostate cancer was brought to the ER after patient was found to be increasingly confused since morning.  As per the patient's son patient also was mildly hypoxic or did not have any nausea vomiting diarrhea chest pain or shortness of breath.  No change in medications recently.  ED Course: In the ER patient initially was found to be in A. fib with RVR which improved with Cardizem bolus.  Lactate was elevated at around 3.4 which further worsened at 5 despite fluids.  WBC count was 17,000 with UA showing features consistent with UTI and chest x-ray showing possible right sided pneumonia.  Patient was started on empiric antibiotics after cultures obtained and admitted for developing sepsis from UTI and possible pneumonia.  As per the patient's and patient is mostly bedbound and uses a wheelchair to move around.  CT head done due to acute encephalopathy was unremarkable.  Review of Systems: As per HPI, rest all negative.   Past Medical History:  Diagnosis Date  . Aortic stenosis   . Aortic stenosis   . Atrial fibrillation ([place])   . Bilateral leg edema   . Cardiac murmur   . Combined fat and carbohydrate induced hyperlipemia 10/28/2014  . Diabetes mellitus without complication ([place])   . Dyslipidemia   . Elevated brain natriuretic peptide (BNP) level 09/01/2014  . Hyperlipidemia 10/28/2014  . LBP (low back pain) 10/28/2014  . Neurogenic bladder    self cath at home  . Prostate cancer ([place])   . Vitamin B deficiency     Past Surgical History:  Procedure Laterality Date  . ORIF HUMERUS  FRACTURE     Open reduction and internal fixation of transcondylar distal humerus fracture with intercondylar extension   . RADIAL HEAD ARTHROPLASTY       reports that he has quit smoking. he has never used smokeless tobacco. He reports that he does not drink alcohol or use drugs.  No Known Allergies  Family History  Problem Relation Age of Onset  . Hypertension Other     Prior to Admission medications   Medication Sig Start Date End Date Taking? Authorizing Provider  acetaminophen (TYLENOL) 500 MG tablet Take 500 mg by mouth every 8 (eight) hours.    Yes [provider]  bicalutamide (CASODEX) 50 MG tablet Take 50 mg by mouth daily.   Yes [provider]  ferrous sulfate 325 (65 FE) MG tablet Take 1 tablet (325 mg total) by mouth 2 (two) times daily with a meal. 03/09/16  Yes Gherghe, Vella Redhead, MD  furosemide (LASIX) 40 MG tablet Take 40 mg by mouth every evening.  03/25/10  Yes [provider]  furosemide (LASIX) 80 MG tablet Take 160 mg by mouth daily.  01/09/17  Yes [provider]  Menthol 0.1 % LOTN Apply 1 application topically every 12 (twelve) hours as needed (redness).   Yes [provider]  metFORMIN (GLUCOPHAGE) 500 MG tablet Take 500 mg by mouth daily with breakfast.  01/09/17  Yes [provider]  metoprolol succinate (TOPROL XL) 25 MG 24 hr tablet Take 0.5 tablets (12.5 mg total) by  mouth daily. 02/17/15  Yes Nahser, Wonda Cheng, MD  pantoprazole (PROTONIX) 40 MG tablet Take 1 tablet (40 mg total) by mouth daily. 03/09/16  Yes Gherghe, Vella Redhead, MD  Polyvinyl Alcohol-Povidone PF (REFRESH) 1.4-0.6 % SOLN Apply 1 drop to eye daily.   Yes [provider]  pravastatin (PRAVACHOL) 20 MG tablet Take 10 mg by mouth at bedtime.  01/18/17  Yes [provider]  Sennosides-Docusate Sodium (SENEXON-S PO) Take 1 tablet by mouth daily. Hold for loose stools    Yes [provider]  cyanocobalamin (,VITAMIN B-12,) 1000  MCG/ML injection Inject 1,000 mcg into the muscle every 30 (thirty) days.  03/06/15   [provider]  Dextromethorphan-Guaifenesin (ROBAFEN DM) 10-100 MG/5ML liquid Take 10 mLs by mouth every 6 (six) hours as needed (cough).    [provider]  HYDROcodone-acetaminophen (NORCO/VICODIN) 5-325 MG tablet Take 1 tablet by mouth 2 (two) times daily as needed for moderate pain. 03/09/16   Caren Griffins, MD  levofloxacin (LEVAQUIN) 500 MG tablet Take 1 tablet (500 mg total) by mouth daily. For 5 more days Patient not taking: Reported on 01/28/2017 03/09/16   Caren Griffins, MD  metFORMIN (GLUCOPHAGE-XR) 500 MG 24 hr tablet Take 500 mg by mouth daily.  02/02/15   [provider]  polyethylene glycol (MIRALAX / GLYCOLAX) packet Take 17 g by mouth daily as needed (for constipation).    [provider]  pravastatin (PRAVACHOL) 10 MG tablet Take 10 mg by mouth at bedtime.     [provider]    Physical Exam: Vitals:   01/19/2017 1918 01/26/2017 1946 01/27/2017 2030 01/21/2017 2129  BP: 121/81 (!) 132/91 118/80 (!) 134/93  Pulse: (!) 121 (!) 130 (!) 41 98  Resp: (!) 32 (!) 30 (!) 28 (!) 35  Temp: (!) 97.4 F (36.3 C)   97.8 F (36.6 C)  TempSrc: Oral   Oral  SpO2: 97% 97% 96% 97%      Constitutional: Moderately built and nourished. Vitals:   01/15/2017 1918 01/29/2017 1946 01/03/2017 2030 01/03/2017 2129  BP: 121/81 (!) 132/91 118/80 (!) 134/93  Pulse: (!) 121 (!) 130 (!) 41 98  Resp: (!) 32 (!) 30 (!) 28 (!) 35  Temp: (!) 97.4 F (36.3 C)   97.8 F (36.6 C)  TempSrc: Oral   Oral  SpO2: 97% 97% 96% 97%   Eyes: Anicteric no pallor. ENMT: No discharge from the ears eyes nose or mouth. Neck: No mass felt but no JVD appreciated. Respiratory: No rhonchi or crepitations. Cardiovascular: S1-S2 heard irregular. Abdomen: Soft nontender bowel sounds present. Musculoskeletal: No edema.  No joint effusion. Skin: No rash.  Skin appears warm. Neurologic: Alert awake  oriented to his name.  Follows commands moves all extremities. Psychiatric: Oriented to his name.   Labs on Admission: I have personally reviewed following labs and imaging studies  CBC: Recent Labs  Lab 01/13/2017 1741  WBC 17.4*  NEUTROABS 15.0*  HGB 13.1  HCT 39.4  MCV 97.3  PLT 297   Basic Metabolic Panel: Recent Labs  Lab 01/22/2017 1741  NA 136  K 3.8  CL 95*  CO2 26  GLUCOSE 179*  BUN 63*  CREATININE 2.54*  CALCIUM 8.8*   GFR: CrCl cannot be calculated (Unknown ideal weight.). Liver Function Tests: Recent Labs  Lab 01/31/2017 1741  AST 23  ALT 12*  ALKPHOS 111  BILITOT 0.7  PROT 7.1  ALBUMIN 3.9   Recent Labs  Lab 01/22/2017 1741  LIPASE 25   No results for input(s): AMMONIA in the last 168 hours. Coagulation Profile: Recent Labs  Lab 01/25/2017 1741  INR 1.08   Cardiac Enzymes: No results for input(s): CKTOTAL, CKMB, CKMBINDEX, TROPONINI in the last 168 hours. BNP (last 3 results) No results for input(s): PROBNP in the last 8760 hours. HbA1C: No results for input(s): HGBA1C in the last 72 hours. CBG: Recent Labs  Lab 01/12/2017 1818  GLUCAP 165*   Lipid Profile: No results for input(s): CHOL, HDL, LDLCALC, TRIG, CHOLHDL, LDLDIRECT in the last 72 hours. Thyroid Function Tests: No results for input(s): TSH, T4TOTAL, FREET4, T3FREE, THYROIDAB in the last 72 hours. Anemia Panel: No results for input(s): VITAMINB12, FOLATE, FERRITIN, TIBC, IRON, RETICCTPCT in the last 72 hours. Urine analysis:    Component Value Date/Time   COLORURINE YELLOW 03/03/2016 0013   APPEARANCEUR TURBID (A) 03/03/2016 0013   LABSPEC 1.010 03/03/2016 0013   PHURINE 5.0 03/03/2016 0013   GLUCOSEU NEGATIVE 03/03/2016 0013   HGBUR MODERATE (A) 03/03/2016 0013   BILIRUBINUR NEGATIVE 03/03/2016 0013   KETONESUR NEGATIVE 03/03/2016 0013   PROTEINUR 100 (A) 03/03/2016 0013   UROBILINOGEN 0.2 07/28/2014 0117   NITRITE NEGATIVE 03/03/2016 0013   LEUKOCYTESUR LARGE (A)  03/03/2016 0013   Sepsis Labs: @LABRCNTIP (procalcitonin:4,lacticidven:4) )No results found for this or any previous visit (from the past 240 hour(s)).   Radiological Exams on Admission: Ct Head Wo Contrast  Result Date: 01/09/2017 CLINICAL DATA:  Altered level of consciousness.  03/04/2016 EXAM: CT HEAD WITHOUT CONTRAST TECHNIQUE: Contiguous axial images were obtained from the base of the skull through the vertex without intravenous contrast. COMPARISON:  03/04/2016 FINDINGS: Brain: There is mild diffuse low-attenuation within the subcortical and periventricular white matter compatible with chronic microvascular disease. There is prominence of the sulci and ventricles compatible with brain atrophy. No evidence of acute infarction, intracranial hemorrhage or mass. No abnormal extra-axial fluid collections identified. No mass effect or midline shift identified. Vascular: No hyperdense vessel or unexpected calcification. Skull: The paranasal sinuses are clear. The mastoid air cells are also clear. The calvarium appears intact. Sinuses/Orbits: No acute finding. Other: None. IMPRESSION: 1. No acute intracranial abnormalities. 2. Chronic small vessel ischemic change and brain atrophy. Electronically Signed   By: Kerby Moors M.D.   On: 01/29/2017 18:46   Dg Chest Port 1 View  Result Date: 01/31/2017 CLINICAL DATA:  Shortness of Breath EXAM: PORTABLE CHEST 1 VIEW COMPARISON:  03/09/2016 FINDINGS: Large hiatal hernia. Mild cardiomegaly. Turn for airspace opacity in the medial right upper lobe. No effusions or acute bony abnormality. IMPRESSION: Cardiomegaly. Large hiatal hernia. Concern for right upper lobe opacity/pneumonia. Electronically Signed   By: Rolm Baptise M.D.   On: 01/17/2017 17:32    EKG: Independently reviewed.  A. fib with RVR.  Assessment/Plan Principal Problem:   SIRS (systemic inflammatory response syndrome) (HCC) Active Problems:   Diabetes type 2, controlled (HCC)   Aortic  stenosis   Atrial fibrillation (HCC)   Acute encephalopathy   Pneumonia    1. Sepsis secondary to possible UTI and developing pneumonia -patient has been empirically placed on vancomycin and cefepime.  Follow urine cultures blood cultures.  Follow lactate levels gently hydrate as needed since patient has history of CHF.  So far patient has received 1.5 L fluid bolus.  Further fluids will be based on blood pressure response and lactate levels.  Follow pro calcitonin levels. 2. Acute encephalopathy likely from sepsis improved at this time.  Closely monitor. 3. A.  fib with RVR likely precipitated by sepsis.  Improved with 1 dose of Cardizem bolus.  Patient is on metoprolol which will be continued.  Closely observe heart rate.  Not on anticoagulation secondary to history of GI bleed.  Chads 2 vasc score is 3. 4. History of moderate aortic stenosis -being managed conservatively. 5. Diabetes mellitus type 2 -while in the hospital patient be on sliding scale coverage. 6. History of prostate cancer on Casodex. 7. Acute renal failure -creatinine worsened from 0.6 last months to 2.5 now.  Likely from sepsis.  Holding of Lasix now and gently receiving fluids.  Closely follow intake output and metabolic panel.  Note that patient uses large doses of Lasix and presently is on hold.  Closely follow respiratory status.   DVT prophylaxis: Lovenox. Code Status: DNR. Family Communication: Patient's son. Disposition Plan: Back to nursing facility once stable. Consults called: None. Admission status: Inpatient.   Rise Patience MD [facility] Pager [phone].  If 7PM-7AM, please contact night-coverage [email or web address] Password [email or web address]  01/11/2017, 10:02 PM

## 2017-02-01 NOTE — ED Notes (Signed)
Patient transported to CT 

## 2017-02-01 NOTE — ED Provider Notes (Signed)
[place] DEPT Provider Note   CSN: [id number] Arrival date & time: 01/08/2017  1618     History   Chief Complaint Chief Complaint  Patient presents with  . Urinary Tract Infection    HPI Parker [name] is a 84 y.o. male.  84 year old male with prior history of aortic stenosis, fibrillation, diabetes, and anemia presents from his facility for evaluation of decreased mental status.  Patient is a [facility] at [facility].  He arrives today by EMS.  Staff at his facility noticed that he seemed to be more confused today.  Patient complains of mild shortness of breath today.  Patient also complains of vague chest discomfort.  It is unclear how long he has had chest discomfort.  Patient's son is at bedside and he confirms that patient is somewhat more confused than baseline. Further history from the patient is limited secondary to AMS.   Patient has a valid DNR.  This was confirmed with patient's son.   The history is provided by the patient.  Altered Mental Status   This is a new problem. The current episode started yesterday. The problem has not changed since onset.Associated symptoms include confusion.    Past Medical History:  Diagnosis Date  . Aortic stenosis   . Aortic stenosis   . Atrial fibrillation ([place])   . Bilateral leg edema   . Cardiac murmur   . Combined fat and carbohydrate induced hyperlipemia 10/28/2014  . Diabetes mellitus without complication ([place])   . Dyslipidemia   . Elevated brain natriuretic peptide (BNP) level 09/01/2014  . Hyperlipidemia 10/28/2014  . LBP (low back pain) 10/28/2014  . Neurogenic bladder    self cath at home  . Prostate cancer ([place])   . Vitamin B deficiency     Patient Active Problem List   Diagnosis Date Noted  . Anemia   . Acute GI bleeding 03/04/2016  . UTI (urinary tract infection) 03/04/2016  . Acute encephalopathy 03/04/2016  . Aortic stenosis 11/17/2014  . Atrial fibrillation ([place]) 11/17/2014   . Other specified diabetes mellitus without complications ([place]) 41/96/2229  . Diabetes type 2, controlled ([place]) 10/28/2014  . Diabetes ([place]) 10/28/2014  . Hyperlipidemia 10/28/2014  . Accumulation of fluid in tissues 10/28/2014  . Herpes zoster without complication 79/89/2119  . LBP (low back pain) 10/28/2014  . Combined fat and carbohydrate induced hyperlipemia 10/28/2014  . Type 2 diabetes mellitus without complications ([place]) 41/74/0814  . Localized edema 09/01/2014  . Elevated brain natriuretic peptide (BNP) level 09/01/2014  . Fluid overload 09/01/2014    Past Surgical History:  Procedure Laterality Date  . ORIF HUMERUS FRACTURE     Open reduction and internal fixation of transcondylar distal humerus fracture with intercondylar extension   . RADIAL HEAD ARTHROPLASTY         Home Medications    Prior to Admission medications   Medication Sig Start Date End Date Taking? Authorizing Provider  acetaminophen (TYLENOL) 500 MG tablet Take 500 mg by mouth every 8 (eight) hours.    Yes [provider]  bicalutamide (CASODEX) 50 MG tablet Take 50 mg by mouth daily.   Yes [provider]  ferrous sulfate 325 (65 FE) MG tablet Take 1 tablet (325 mg total) by mouth 2 (two) times daily with a meal. 03/09/16  Yes Gherghe, Vella Redhead, MD  furosemide (LASIX) 40 MG tablet Take 40 mg by mouth every evening.  03/25/10  Yes [provider]  furosemide (LASIX) 80 MG tablet Take  160 mg by mouth daily.  01/09/17  Yes [provider]  Menthol 0.1 % LOTN Apply 1 application topically every 12 (twelve) hours as needed (redness).   Yes [provider]  metFORMIN (GLUCOPHAGE) 500 MG tablet Take 500 mg by mouth daily with breakfast.  01/09/17  Yes [provider]  metoprolol succinate (TOPROL XL) 25 MG 24 hr tablet Take 0.5 tablets (12.5 mg total) by mouth daily. 02/17/15  Yes Nahser, Wonda Cheng, MD  pantoprazole (PROTONIX) 40 MG tablet Take 1 tablet (40 mg  total) by mouth daily. 03/09/16  Yes Gherghe, Vella Redhead, MD  Polyvinyl Alcohol-Povidone PF (REFRESH) 1.4-0.6 % SOLN Apply 1 drop to eye daily.   Yes [provider]  pravastatin (PRAVACHOL) 20 MG tablet Take 10 mg by mouth at bedtime.  01/18/17  Yes [provider]  Sennosides-Docusate Sodium (SENEXON-S PO) Take 1 tablet by mouth daily. Hold for loose stools    Yes [provider]  cyanocobalamin (,VITAMIN B-12,) 1000 MCG/ML injection Inject 1,000 mcg into the muscle every 30 (thirty) days.  03/06/15   [provider]  Dextromethorphan-Guaifenesin (ROBAFEN DM) 10-100 MG/5ML liquid Take 10 mLs by mouth every 6 (six) hours as needed (cough).    [provider]  HYDROcodone-acetaminophen (NORCO/VICODIN) 5-325 MG tablet Take 1 tablet by mouth 2 (two) times daily as needed for moderate pain. 03/09/16   Caren Griffins, MD  levofloxacin (LEVAQUIN) 500 MG tablet Take 1 tablet (500 mg total) by mouth daily. For 5 more days Patient not taking: Reported on 01/25/2017 03/09/16   Caren Griffins, MD  metFORMIN (GLUCOPHAGE-XR) 500 MG 24 hr tablet Take 500 mg by mouth daily.  02/02/15   [provider]  polyethylene glycol (MIRALAX / GLYCOLAX) packet Take 17 g by mouth daily as needed (for constipation).    [provider]  pravastatin (PRAVACHOL) 10 MG tablet Take 10 mg by mouth at bedtime.     [provider]    Family History Family History  Family history unknown: Yes    Social History Social History   Tobacco Use  . Smoking status: Former Research scientist (life sciences)  . Smokeless tobacco: Never Used  Substance Use Topics  . Alcohol use: No  . Drug use: No     Allergies   Patient has no known allergies.   Review of Systems Review of Systems  Unable to perform ROS: Mental status change  Respiratory: Positive for shortness of breath.   Cardiovascular: Positive for chest pain.  Psychiatric/Behavioral: Positive for confusion.     Physical  Exam Updated Vital Signs BP 99/62   Pulse 85   Temp 97.8 F (36.6 C) (Oral)   Resp (!) 31   SpO2 91%   Physical Exam  Constitutional: He appears well-developed and well-nourished. No distress.  HENT:  Head: Normocephalic and atraumatic.  Mouth/Throat: Oropharynx is clear and moist.  Eyes: Conjunctivae and EOM are normal. Pupils are equal, round, and reactive to light.  Neck: Normal range of motion. Neck supple.  Cardiovascular: Normal heart sounds.  Irregularly irregular rhythm consistent with AFIB  Tachycardic   Pulmonary/Chest: Effort normal and breath sounds normal. No respiratory distress.  Abdominal: Soft. He exhibits no distension. There is no tenderness.  Musculoskeletal: Normal range of motion. He exhibits no edema or deformity.  Neurological: He is alert.  Pleasantly confused   Oriented to person   Skin: Skin is warm and dry.  Psychiatric: He has a normal mood and affect.  Nursing note and  vitals reviewed.    ED Treatments / Results  Labs (all labs ordered are listed, but only abnormal results are displayed) Labs Reviewed  COMPREHENSIVE METABOLIC PANEL - Abnormal; Notable for the following components:      Result Value   Chloride 95 (*)    Glucose, Bld 179 (*)    BUN 63 (*)    Creatinine, Ser 2.54 (*)    Calcium 8.8 (*)    ALT 12 (*)    GFR calc non Af Amer 21 (*)    GFR calc Af Amer 24 (*)    All other components within normal limits  BRAIN NATRIURETIC PEPTIDE - Abnormal; Notable for the following components:   B Natriuretic Peptide 208.2 (*)    All other components within normal limits  CBC WITH DIFFERENTIAL/PLATELET - Abnormal; Notable for the following components:   WBC 17.4 (*)    RBC 4.05 (*)    Neutro Abs 15.0 (*)    Monocytes Absolute 1.4 (*)    All other components within normal limits  CBG MONITORING, ED - Abnormal; Notable for the following components:   Glucose-Capillary 165 (*)    All other components within normal limits  I-STAT CG4  LACTIC ACID, ED - Abnormal; Notable for the following components:   Lactic Acid, Venous 3.45 (*)    All other components within normal limits  CULTURE, BLOOD (ROUTINE X 2)  CULTURE, BLOOD (ROUTINE X 2)  LIPASE, BLOOD  PROTIME-INR  URINALYSIS, ROUTINE W REFLEX MICROSCOPIC  I-STAT TROPONIN, ED  I-STAT CG4 LACTIC ACID, ED    EKG  EKG Interpretation  Date/Time:  Wednesday February 01 2017 17:10:20 EST Ventricular Rate:  139 PR Interval:    QRS Duration: 73 QT Interval:  302 QTC Calculation: 460 R Axis:   -33 Text Interpretation:  Atrial fibrillation with rapid V-rate Inferior infarct, old Extensive anterior infarct, old Lateral leads are also involved Confirmed by Dene Gentry [phone]) on 01/31/2017 5:30:03 PM       Radiology Ct Head Wo Contrast  Result Date: 01/23/2017 CLINICAL DATA:  Altered level of consciousness.  03/04/2016 EXAM: CT HEAD WITHOUT CONTRAST TECHNIQUE: Contiguous axial images were obtained from the base of the skull through the vertex without intravenous contrast. COMPARISON:  03/04/2016 FINDINGS: Brain: There is mild diffuse low-attenuation within the subcortical and periventricular white matter compatible with chronic microvascular disease. There is prominence of the sulci and ventricles compatible with brain atrophy. No evidence of acute infarction, intracranial hemorrhage or mass. No abnormal extra-axial fluid collections identified. No mass effect or midline shift identified. Vascular: No hyperdense vessel or unexpected calcification. Skull: The paranasal sinuses are clear. The mastoid air cells are also clear. The calvarium appears intact. Sinuses/Orbits: No acute finding. Other: None. IMPRESSION: 1. No acute intracranial abnormalities. 2. Chronic small vessel ischemic change and brain atrophy. Electronically Signed   By: Kerby Moors M.D.   On: 01/18/2017 18:46   Dg Chest Port 1 View  Result Date: 01/10/2017 CLINICAL DATA:  Shortness of Breath EXAM: PORTABLE  CHEST 1 VIEW COMPARISON:  03/09/2016 FINDINGS: Large hiatal hernia. Mild cardiomegaly. Turn for airspace opacity in the medial right upper lobe. No effusions or acute bony abnormality. IMPRESSION: Cardiomegaly. Large hiatal hernia. Concern for right upper lobe opacity/pneumonia. Electronically Signed   By: Rolm Baptise M.D.   On: 01/27/2017 17:32    Procedures Procedures (including critical care time)  Medications Ordered in ED Medications  cefTRIAXone (ROCEPHIN) 1 g in dextrose 5 % 50 mL IVPB (1 g  Intravenous New Bag/Given 01/16/2017 1810)  azithromycin (ZITHROMAX) 500 mg in dextrose 5 % 250 mL IVPB (not administered)  diltiazem (CARDIZEM) injection 5 mg (5 mg Intravenous Given 01/12/2017 1800)  sodium chloride 0.9 % bolus 500 mL (500 mLs Intravenous New Bag/Given 01/20/2017 1811)     Initial Impression / Assessment and Plan / ED Course  I have reviewed the triage vital signs and the nursing notes.  Pertinent labs & imaging results that were available during my care of the patient were reviewed by me and considered in my medical decision making (see chart for details).     MDM  Screen complete  Patient is presenting with altered mental status and reported shortness of breath.  Workup suggests possibility of dehydration with acute renal failure.  The patient's chest x-ray suggests pneumonia.  Broad-spectrum antibiotics given upon arrival.  IV fluids resuscitation initiated. DNR status confirmed with Family (Son) at bedside.  Case discussed with admitting hospitalist service and they will evaluate the patient for admission.   Final Clinical Impressions(s) / ED Diagnoses   Final diagnoses:  Shortness of breath  Acute renal failure, unspecified acute renal failure type [facility])  Atrial fibrillation, unspecified type [facility])    ED Discharge Orders    None       Valarie Merino, MD 01/18/2017 2024

## 2017-02-01 NOTE — ED Notes (Signed)
Bed: WA08 Expected date:  Expected time:  Means of arrival:  Comments: EMS-[facility]I 

## 2017-02-01 NOTE — ED Notes (Signed)
Patient's bed assignment has been changed and awaiting a new bed assignment.

## 2017-02-01 NOTE — ED Notes (Signed)
Gave report to McNary, RN for room 1501.

## 2017-02-01 NOTE — ED Triage Notes (Signed)
Patient is from [facility] [place] and transported via [employer]. Per EMS, patient is experiencing a mental status change from yesterday with urine being cloudy and dark. Also, complains of a burning sensation. Per facility, patients base line is alert, oriented 4 but today he is alert, oriented x 1. Also usually walks with a walker.

## 2017-02-02 ENCOUNTER — Other Ambulatory Visit: Payer: Self-pay

## 2017-02-02 ENCOUNTER — Inpatient Hospital Stay (HOSPITAL_COMMUNITY): Payer: Medicare HMO

## 2017-02-02 DIAGNOSIS — N179 Acute kidney failure, unspecified: Secondary | ICD-10-CM

## 2017-02-02 DIAGNOSIS — D72829 Elevated white blood cell count, unspecified: Secondary | ICD-10-CM

## 2017-02-02 DIAGNOSIS — G9340 Encephalopathy, unspecified: Secondary | ICD-10-CM

## 2017-02-02 DIAGNOSIS — J189 Pneumonia, unspecified organism: Secondary | ICD-10-CM

## 2017-02-02 LAB — URINALYSIS, ROUTINE W REFLEX MICROSCOPIC
Bilirubin Urine: NEGATIVE
Glucose, UA: NEGATIVE mg/dL
Ketones, ur: NEGATIVE mg/dL
Nitrite: NEGATIVE
PH: 5 (ref 5.0–8.0)
Protein, ur: NEGATIVE mg/dL
SPECIFIC GRAVITY, URINE: 1.01 (ref 1.005–1.030)
Squamous Epithelial / LPF: NONE SEEN

## 2017-02-02 LAB — CBC
HCT: 40.4 % (ref 39.0–52.0)
HCT: 38.1 % — ABNORMAL LOW (ref 39.0–52.0)
Hemoglobin: 13.7 g/dL (ref 13.0–17.0)
Hemoglobin: 12.7 g/dL — ABNORMAL LOW (ref 13.0–17.0)
MCH: 32.2 pg (ref 26.0–34.0)
MCH: 32.2 pg (ref 26.0–34.0)
MCHC: 33.9 g/dL (ref 30.0–36.0)
MCHC: 33.3 g/dL (ref 30.0–36.0)
MCV: 94.8 fL (ref 78.0–100.0)
MCV: 96.7 fL (ref 78.0–100.0)
PLATELETS: 227 10*3/uL (ref 150–400)
PLATELETS: 212 10*3/uL (ref 150–400)
RBC: 4.26 MIL/uL (ref 4.22–5.81)
RBC: 3.94 MIL/uL — ABNORMAL LOW (ref 4.22–5.81)
RDW: 14.1 % (ref 11.5–15.5)
RDW: 13.9 % (ref 11.5–15.5)
WBC: 20.1 10*3/uL — AB (ref 4.0–10.5)
WBC: 21.5 10*3/uL — ABNORMAL HIGH (ref 4.0–10.5)

## 2017-02-02 LAB — GLUCOSE, CAPILLARY
GLUCOSE-CAPILLARY: 160 mg/dL — AB (ref 65–99)
GLUCOSE-CAPILLARY: 118 mg/dL — AB (ref 65–99)
Glucose-Capillary: 190 mg/dL — ABNORMAL HIGH (ref 65–99)
Glucose-Capillary: 155 mg/dL — ABNORMAL HIGH (ref 65–99)
Glucose-Capillary: 130 mg/dL — ABNORMAL HIGH (ref 65–99)

## 2017-02-02 LAB — BASIC METABOLIC PANEL
Anion gap: 14 (ref 5–15)
Anion gap: 12 (ref 5–15)
BUN: 63 mg/dL — AB (ref 6–20)
BUN: 53 mg/dL — AB (ref 6–20)
CALCIUM: 8.5 mg/dL — AB (ref 8.9–10.3)
CALCIUM: 8.5 mg/dL — AB (ref 8.9–10.3)
CO2: 24 mmol/L (ref 22–32)
CO2: 26 mmol/L (ref 22–32)
CREATININE: 3 mg/dL — AB (ref 0.61–1.24)
CREATININE: 2.14 mg/dL — AB (ref 0.61–1.24)
Chloride: 97 mmol/L — ABNORMAL LOW (ref 101–111)
Chloride: 98 mmol/L — ABNORMAL LOW (ref 101–111)
GFR calc Af Amer: 20 mL/min — ABNORMAL LOW (ref >60)
GFR calc Af Amer: 29 mL/min — ABNORMAL LOW (ref >60)
GFR, EST NON AFRICAN AMERICAN: 17 mL/min — AB (ref >60)
GFR, EST NON AFRICAN AMERICAN: 25 mL/min — AB (ref >60)
GLUCOSE: 174 mg/dL — AB (ref 65–99)
Glucose, Bld: 193 mg/dL — ABNORMAL HIGH (ref 65–99)
Potassium: 3.8 mmol/L (ref 3.5–5.1)
Potassium: 3.4 mmol/L — ABNORMAL LOW (ref 3.5–5.1)
SODIUM: 135 mmol/L (ref 135–145)
Sodium: 136 mmol/L (ref 135–145)

## 2017-02-02 LAB — STREP PNEUMONIAE URINARY ANTIGEN: Strep Pneumo Urinary Antigen: NEGATIVE

## 2017-02-02 LAB — INFLUENZA PANEL BY PCR (TYPE A & B)
INFLBPCR: NEGATIVE
Influenza A By PCR: NEGATIVE

## 2017-02-02 LAB — LACTIC ACID, PLASMA
LACTIC ACID, VENOUS: 3.7 mmol/L — AB (ref 0.5–1.9)
LACTIC ACID, VENOUS: 4 mmol/L — AB (ref 0.5–1.9)

## 2017-02-02 LAB — MRSA PCR SCREENING: MRSA BY PCR: NEGATIVE

## 2017-02-02 LAB — TROPONIN I
Troponin I: <0.03 ng/mL (ref <0.03)
Troponin I: <0.03 ng/mL (ref <0.03)
Troponin I: <0.03 ng/mL (ref <0.03)

## 2017-02-02 LAB — TSH: TSH: 3.917 u[IU]/mL (ref 0.350–4.500)

## 2017-02-02 LAB — PROCALCITONIN: PROCALCITONIN: 1.98 ng/mL

## 2017-02-02 MED ORDER — SODIUM CHLORIDE 0.9 % IV BOLUS (SEPSIS)
500.0000 mL | Freq: Once | INTRAVENOUS | Status: AC
  Administered 2017-02-02: 500 mL via INTRAVENOUS

## 2017-02-02 MED ORDER — METOPROLOL TARTRATE 5 MG/5ML IV SOLN
5.0000 mg | INTRAVENOUS | Status: AC
  Administered 2017-02-02: 5 mg via INTRAVENOUS
  Filled 2017-02-02: qty 5

## 2017-02-02 MED ORDER — SODIUM CHLORIDE 0.9 % IV SOLN
INTRAVENOUS | Status: DC
  Administered 2017-02-02 – 2017-02-03 (×3): via INTRAVENOUS

## 2017-02-02 MED ORDER — DEXTROSE 5 % IV SOLN
1.0000 g | INTRAVENOUS | Status: DC
  Administered 2017-02-02: 1 g via INTRAVENOUS
  Filled 2017-02-02: qty 1

## 2017-02-02 MED ORDER — METOPROLOL SUCCINATE ER 25 MG PO TB24
12.5000 mg | ORAL_TABLET | Freq: Every day | ORAL | Status: DC
  Administered 2017-02-02 – 2017-02-05 (×4): 12.5 mg via ORAL
  Filled 2017-02-02 (×5): qty 1

## 2017-02-02 MED ORDER — METOPROLOL TARTRATE 5 MG/5ML IV SOLN
2.5000 mg | Freq: Once | INTRAVENOUS | Status: AC
  Administered 2017-02-02: 2.5 mg via INTRAVENOUS
  Filled 2017-02-02: qty 5

## 2017-02-02 MED ORDER — METOPROLOL SUCCINATE ER 25 MG PO TB24: 12.5000 mg | ORAL_TABLET | Freq: Every day | ORAL | Status: DC

## 2017-02-02 MED ORDER — ALBUTEROL SULFATE (2.5 MG/3ML) 0.083% IN NEBU: 2.5000 mg | INHALATION_SOLUTION | RESPIRATORY_TRACT | Status: DC | PRN

## 2017-02-02 NOTE — Progress Notes (Addendum)
Patient's HR up to 170s times 3 for just a few seconds. HR quickly decreased to  110s. Patient had no complaints. Paged [facility]. New orders placed for 528ml bolus over 2 hours and 2.5mg  IV lopressor. Continue to monitor.

## 2017-02-02 NOTE — ED Notes (Signed)
Angus Seller, RN report.

## 2017-02-02 NOTE — Care Management Note (Signed)
Case Management Note  Patient Details  Name: Parker [name] MRN: [id number] Date of Birth: 07-20-1923  Subjective/Objective:                  urosepsis  Action/Plan: Date: February 02, 2017 Velva Harman, BSN, [place], [place] [phone] Chart and notes review for patient progress and needs. Will follow for case management and discharge needs. No cm or discharge needs present at time of this review. Next review date: [id number]  Expected Discharge Date:  (unknown)               Expected Discharge Plan:  Home/Self Care  In-House Referral:     Discharge planning Services  CM Consult  Post Acute Care Choice:    Choice offered to:     DME Arranged:    DME Agency:     HH Arranged:    HH Agency:     Status of Service:  In process, will continue to follow  If discussed at Long Length of Stay Meetings, dates discussed:    Additional Comments:  Leeroy Cha, RN 02/02/2017, 8:38 AM

## 2017-02-02 NOTE — Progress Notes (Signed)
PROGRESS NOTE    Parker [name]  TWS:[id number] DOB: 1923-12-12 DOA: 01/08/2017 PCP: Seward Carol, MD  Brief [place] y.o[place] male with history of atrial fibrillation, diabetes mellitus type 2, GI bleed, aortic stenosis and prostate cancer was brought to the ER after patient was found to be increasingly confused since morning.  As per the patient's son patient also was mildly hypoxic or did not have any nausea vomiting diarrhea chest pain or shortness of breath.  No change in medications recently.  ED Course: In the ER patient initially was found to be in A. fib with RVR which improved with Cardizem bolus.  Lactate was elevated at around 3.4 which further worsened at 5 despite fluids.  WBC count was 17,000 with UA showing features consistent with UTI and chest x-ray showing possible right sided pneumonia.  Patient was started on empiric antibiotics after cultures obtained and admitted for developing sepsis from UTI and possible pneumonia.  As per the patient's and patient is mostly bedbound and uses a wheelchair to move around.  CT head done due to acute encephalopathy was unremarkable. 02/02/2017 patient awake.  Sent by the bedside.  He feels his breathing is better but still short of breath and dyspneic on exertion.   :Assessment & Plan:   Principal Problem:   SIRS (systemic inflammatory response syndrome) (HCC) Active Problems:   Diabetes type 2, controlled (HCC)   Aortic stenosis   Atrial fibrillation (HCC)   Acute encephalopathy   Pneumonia  1Sepsis due to HCAP-patient admitted with shortness of breath and cough.  Chest x-ray showed possible early right upper lobe pneumonia.  Patient coming from nursing home.  According to family patient has had multiple episodes of pneumonia.  He was started on vancomycin and cefepime empirically.  MRSA PCR is negative will DC vancomycin today.  His lactate level has been decreasing with IV hydration.  Start nebulizer treatments with Xopenex.  2] A. fib  RVR upon admission status post 1 dose of Cardizem bolus.  Patient takes metoprolol which is being continued at this time is not on any anticoagulation due to history of GI bleed.  He is on metoprolol 12.5 mg once a day his blood pressure has been soft.  His heart rate has been anywhere from 128-37.  The last documented heart rate to have is 37 while I was seeing the patient his heart rate was in the 90s.  Patient's son does not want anything aggressive done at this time.  Patient is DO NOT RESUSCITATE his son [name] has the POA.  Monitor closely.  Also has a history of moderate aortic stenosis which is being managed medically.  3] status post acute encephalopathy secondary to HCAP.  Head CT shows no acute changes but atrophy consistent with age.  4] leukocytosis-due to healthcare associated pneumonia.  UA showed moderate amount of leukocytes with negative nitrites.  Final urine culture is pending at this time.  Patient does have a chronic Foley catheter in place with a history of prostate cancer on Casodex treatment as an outpatient.  He does have a history of recurrent chronic UTI.  5] acute renal failure with a creatinine of 3.0 today last creatinine 0.6 from a few months ago.  Possibly secondary to dehydration and sepsis.  I will continue hydration cautiously.  And monitor his renal functions.  Patient was on metformin as an outpatient.  DVT prophylaxis: SCD Code Status: DNR Family Communication discussed with son Disposition Plan: tbd Consultants: None  Procedures: None Antimicrobials: Cefepime Subjective:  Complains of shortness of breath  Objective: Vitals:   02/02/17 0400 02/02/17 0500 02/02/17 0600 02/02/17 0800  BP:  96/63 (!) 112/58   Pulse: (!) 126 (!) 106 (!) 37   Resp: (!) 22 20 (!) 23   Temp:    (!) 97.3 F (36.3 C)  TempSrc:    Oral  SpO2: 97% 96% 96%   Weight:      Height:        Intake/Output Summary (Last 24 hours) at 02/02/2017 1146 Last data filed at 02/02/2017  0615 Gross per 24 hour  Intake 50 ml  Output 2450 ml  Net -2400 ml   Filed Weights   02/02/17 0018 02/02/17 0303  Weight: 90.7 kg (200 lb) 81.9 kg (180 lb 8.9 oz)    Examination:  General exam: Appears calm and comfortable  Respiratory system: rhonchi  to auscultation. Respiratory effort tachypneic Cardiovascular system: S1 & S2 heard, RRR. No JVD, murmurs, rubs, gallops or clicks. No pedal edema. Gastrointestinal system: Abdomen is nondistended, soft and nontender. No organomegaly or masses felt. Normal bowel sounds heard. Central nervous system: Alert and oriented. No focal neurological deficits. Extremities: Symmetric 5 x 5 power. Skin: No rashes, lesions or ulcers Psychiatry: Judgement and insight appear normal. Mood & affect appropriate.     Data Reviewed: I have personally reviewed following labs and imaging studies  CBC: Recent Labs  Lab 01/31/2017 1741 02/02/17 0329  WBC 17.4* 20.1*  NEUTROABS 15.0*  --   HGB 13.1 13.7  HCT 39.4 40.4  MCV 97.3 94.8  PLT 267 124   Basic Metabolic Panel: Recent Labs  Lab 01/11/2017 1741 02/02/17 0329  NA 136 135  K 3.8 3.8  CL 95* 97*  CO2 26 24  GLUCOSE 179* 193*  BUN 63* 63*  CREATININE 2.54* 3.00*  CALCIUM 8.8* 8.5*   GFR: Estimated Creatinine Clearance: 16.6 mL/min (A) (by C-G formula based on SCr of 3 mg/dL (H)). Liver Function Tests: Recent Labs  Lab 01/24/2017 1741  AST 23  ALT 12*  ALKPHOS 111  BILITOT 0.7  PROT 7.1  ALBUMIN 3.9   Recent Labs  Lab 01/28/2017 1741  LIPASE 25   No results for input(s): AMMONIA in the last 168 hours. Coagulation Profile: Recent Labs  Lab 01/19/2017 1741  INR 1.08   Cardiac Enzymes: Recent Labs  Lab 02/02/17 0058 02/02/17 0329 02/02/17 0952  TROPONINI <0.03 <0.03 <0.03   BNP (last 3 results) No results for input(s): PROBNP in the last 8760 hours. HbA1C: No results for input(s): HGBA1C in the last 72 hours. CBG: Recent Labs  Lab 01/27/2017 1818 02/02/17 0323  02/02/17 0753  GLUCAP 165* 190* 160*   Lipid Profile: No results for input(s): CHOL, HDL, LDLCALC, TRIG, CHOLHDL, LDLDIRECT in the last 72 hours. Thyroid Function Tests: Recent Labs    01/11/2017 1757  TSH 3.917   Anemia Panel: No results for input(s): VITAMINB12, FOLATE, FERRITIN, TIBC, IRON, RETICCTPCT in the last 72 hours. Sepsis Labs: Recent Labs  Lab 01/14/2017 1756 02/02/2017 2139 02/02/17 0058 02/02/17 0059 02/02/17 0329  PROCALCITON  --   --  1.98  --   --   LATICACIDVEN 3.45* 5.03*  --  4.0* 3.7*    Recent Results (from the past 240 hour(s))  MRSA PCR Screening     Status: None   Collection Time: 02/02/17  2:53 AM  Result Value Ref Range Status   MRSA by PCR NEGATIVE NEGATIVE Final    Comment:  The GeneXpert MRSA Assay (FDA approved for NASAL specimens only), is one component of a comprehensive MRSA colonization surveillance program. It is not intended to diagnose MRSA infection nor to guide or monitor treatment for MRSA infections.          Radiology Studies: Ct Head Wo Contrast  Result Date: 01/23/2017 CLINICAL DATA:  Altered level of consciousness.  03/04/2016 EXAM: CT HEAD WITHOUT CONTRAST TECHNIQUE: Contiguous axial images were obtained from the base of the skull through the vertex without intravenous contrast. COMPARISON:  03/04/2016 FINDINGS: Brain: There is mild diffuse low-attenuation within the subcortical and periventricular white matter compatible with chronic microvascular disease. There is prominence of the sulci and ventricles compatible with brain atrophy. No evidence of acute infarction, intracranial hemorrhage or mass. No abnormal extra-axial fluid collections identified. No mass effect or midline shift identified. Vascular: No hyperdense vessel or unexpected calcification. Skull: The paranasal sinuses are clear. The mastoid air cells are also clear. The calvarium appears intact. Sinuses/Orbits: No acute finding. Other: None. IMPRESSION:  1. No acute intracranial abnormalities. 2. Chronic small vessel ischemic change and brain atrophy. Electronically Signed   By: Kerby Moors M.D.   On: 01/18/2017 18:46   Dg Chest Port 1 View  Result Date: 01/28/2017 CLINICAL DATA:  Shortness of Breath EXAM: PORTABLE CHEST 1 VIEW COMPARISON:  03/09/2016 FINDINGS: Large hiatal hernia. Mild cardiomegaly. Turn for airspace opacity in the medial right upper lobe. No effusions or acute bony abnormality. IMPRESSION: Cardiomegaly. Large hiatal hernia. Concern for right upper lobe opacity/pneumonia. Electronically Signed   By: Rolm Baptise M.D.   On: 01/06/2017 17:32        Scheduled Meds: . bicalutamide  50 mg Oral Daily  . ferrous sulfate  325 mg Oral BID WC  . insulin aspart  0-9 Units Subcutaneous TID WC  . metoprolol succinate  12.5 mg Oral Daily  . pantoprazole  40 mg Oral Daily  . polyvinyl alcohol  1 drop Both Eyes Daily  . pravastatin  10 mg Oral QHS   Continuous Infusions: . sodium chloride 125 mL/hr at 02/02/17 0857  . ceFEPime (MAXIPIME) IV       LOS: 1 day     Georgette Shell, MD [facility] If 7PM-7AM, please contact night-coverage [email or web address] Password [facility] 02/02/2017, 11:46 AM

## 2017-02-02 NOTE — ED Notes (Signed)
[name] Jumper [phone], son

## 2017-02-02 NOTE — Progress Notes (Signed)
Patient is currently in Atrial fibrillation RVR; heart rate continuously fluctuating between 110 and 150. Paged [facility]; orders were given and administered. Will continue to monitor

## 2017-02-02 NOTE — Progress Notes (Addendum)
Pharmacy Antibiotic Note  Parker [name] is a 84 y.o. male admitted on 01/05/2017 with PNA.  Pharmacy has been consulted for Vancomycin, cefepime dosing.  Plan: Vancomycin 1gm iv x1, then check level 48hr after dose, redose if < 20 Cefepime 1gm iv q24hr   Height: 5\' 10"  (177.8 cm) Weight: 180 lb 8.9 oz (81.9 kg) IBW/kg (Calculated) : 73  Temp (24hrs), Avg:97.8 F (36.6 C), Min:97.4 F (36.3 C), Max:98 F (36.7 C)  Recent Labs  Lab 01/20/2017 1741 01/09/2017 1756 01/09/2017 2139 02/02/17 0059 02/02/17 0329  WBC 17.4*  --   --   --  20.1*  CREATININE 2.54*  --   --   --  3.00*  LATICACIDVEN  --  3.45* 5.03* 4.0* 3.7*    Estimated Creatinine Clearance: 16.6 mL/min (A) (by C-G formula based on SCr of 3 mg/dL (H)).    No Known Allergies  Antimicrobials this admission: Vancomycin 01/21/2017 >> Cefepime 01/05/2017 >>  Dose adjustments this admission: -  Microbiology results: pending  Thank you for allowing pharmacy to be a part of this patient's care.  Nani Skillern Crowford 02/02/2017 5:36 AM

## 2017-02-02 NOTE — Progress Notes (Signed)
CRITICAL VALUE ALERT  Critical Value: Lactic 3.7  Date & Time Notied: 0430 02/02/2017  Provider Notified: [facility] notified  Orders Received/Actions taken: Orders received and administered

## 2017-02-03 ENCOUNTER — Inpatient Hospital Stay (HOSPITAL_COMMUNITY): Payer: Medicare HMO

## 2017-02-03 DIAGNOSIS — I350 Nonrheumatic aortic (valve) stenosis: Secondary | ICD-10-CM

## 2017-02-03 DIAGNOSIS — I4891 Unspecified atrial fibrillation: Secondary | ICD-10-CM

## 2017-02-03 LAB — BASIC METABOLIC PANEL
ANION GAP: 10 (ref 5–15)
BUN: 48 mg/dL — ABNORMAL HIGH (ref 6–20)
CHLORIDE: 102 mmol/L (ref 101–111)
CO2: 26 mmol/L (ref 22–32)
CREATININE: 1.33 mg/dL — AB (ref 0.61–1.24)
Calcium: 8.6 mg/dL — ABNORMAL LOW (ref 8.9–10.3)
GFR calc non Af Amer: 45 mL/min — ABNORMAL LOW (ref >60)
GFR, EST AFRICAN AMERICAN: 52 mL/min — AB (ref >60)
Glucose, Bld: 125 mg/dL — ABNORMAL HIGH (ref 65–99)
Potassium: 3.3 mmol/L — ABNORMAL LOW (ref 3.5–5.1)
SODIUM: 138 mmol/L (ref 135–145)

## 2017-02-03 LAB — LEGIONELLA PNEUMOPHILA SEROGP 1 UR AG: L. PNEUMOPHILA SEROGP 1 UR AG: NEGATIVE

## 2017-02-03 LAB — CBC
HEMATOCRIT: 35.7 % — AB (ref 39.0–52.0)
Hemoglobin: 11.9 g/dL — ABNORMAL LOW (ref 13.0–17.0)
MCH: 31.8 pg (ref 26.0–34.0)
MCHC: 33.3 g/dL (ref 30.0–36.0)
MCV: 95.5 fL (ref 78.0–100.0)
Platelets: 188 10*3/uL (ref 150–400)
RBC: 3.74 MIL/uL — AB (ref 4.22–5.81)
RDW: 14.2 % (ref 11.5–15.5)
WBC: 21.8 10*3/uL — AB (ref 4.0–10.5)

## 2017-02-03 LAB — GLUCOSE, CAPILLARY
GLUCOSE-CAPILLARY: 113 mg/dL — AB (ref 65–99)
GLUCOSE-CAPILLARY: 127 mg/dL — AB (ref 65–99)
Glucose-Capillary: 115 mg/dL — ABNORMAL HIGH (ref 65–99)
Glucose-Capillary: 133 mg/dL — ABNORMAL HIGH (ref 65–99)

## 2017-02-03 MED ORDER — DEXTROSE 5 % IV SOLN
1.0000 g | Freq: Two times a day (BID) | INTRAVENOUS | Status: DC
  Administered 2017-02-03 – 2017-02-04 (×3): 1 g via INTRAVENOUS
  Filled 2017-02-03 (×4): qty 1

## 2017-02-03 MED ORDER — ORAL CARE MOUTH RINSE
15.0000 mL | Freq: Two times a day (BID) | OROMUCOSAL | Status: DC
  Administered 2017-02-03 – 2017-02-04 (×2): 15 mL via OROMUCOSAL

## 2017-02-03 MED ORDER — METOPROLOL TARTRATE 5 MG/5ML IV SOLN
2.5000 mg | Freq: Once | INTRAVENOUS | Status: AC
  Administered 2017-02-03: 2.5 mg via INTRAVENOUS
  Filled 2017-02-03: qty 5

## 2017-02-03 MED ORDER — CHLORHEXIDINE GLUCONATE 0.12 % MT SOLN
15.0000 mL | Freq: Two times a day (BID) | OROMUCOSAL | Status: DC
  Administered 2017-02-03 – 2017-02-05 (×4): 15 mL via OROMUCOSAL
  Filled 2017-02-03 (×3): qty 15

## 2017-02-03 MED ORDER — METOPROLOL TARTRATE 5 MG/5ML IV SOLN
5.0000 mg | Freq: Once | INTRAVENOUS | Status: AC
  Administered 2017-02-03: 5 mg via INTRAVENOUS
  Filled 2017-02-03: qty 5

## 2017-02-03 MED ORDER — DILTIAZEM 12 MG/ML ORAL SUSPENSION
30.0000 mg | Freq: Four times a day (QID) | ORAL | Status: DC
  Administered 2017-02-03 – 2017-02-05 (×9): 30 mg via ORAL
  Filled 2017-02-03 (×13): qty 3

## 2017-02-03 NOTE — Progress Notes (Signed)
Pharmacy Antibiotic Note  Parker [name] is a 84 y.o. male admitted on 01/19/2017 with PNA and UTI.  Pharmacy has been consulted for cefepime dosing.  Plan: Increase to cefepime 1gm IV q12hr Follow up renal fxn, culture results, and clinical course.   Height: 5\' 10"  (177.8 cm) Weight: 184 lb 11.9 oz (83.8 kg) IBW/kg (Calculated) : 73  Temp (24hrs), Avg:97.6 F (36.4 C), Min:97.4 F (36.3 C), Max:98 F (36.7 C)  Recent Labs  Lab 01/21/2017 1741 01/30/2017 1756 01/08/2017 2139 02/02/17 0059 02/02/17 0329 02/02/17 1143 02/03/17 0302  WBC 17.4*  --   --   --  20.1* 21.5* 21.8*  CREATININE 2.54*  --   --   --  3.00* 2.14* 1.33*  LATICACIDVEN  --  3.45* 5.03* 4.0* 3.7*  --   --     Estimated Creatinine Clearance: 37.4 mL/min (A) (by C-G formula based on SCr of 1.33 mg/dL (H)).    No Known Allergies  Antimicrobials this admission: 1/30 Azithromycin >> x1 only 1/30 Ceftriaxone >> x1 only 1/30 Cefepime >> (2/8) 1/30 Vancomycin >> 1/31  Dose adjustments this admission:   Microbiology results: 1/31 BCx: ngtd 1/31 MRSA PCR: neg 1/31 Strep pneumo ur ag: neg 1/31 influenza a/b: neg 1/31 UCx: (collected after abx started):   Thank you for allowing pharmacy to be a part of this patient's care.  Gretta Arab PharmD, BCPS Pager [phone] 02/03/2017 11:33 AM

## 2017-02-03 NOTE — Progress Notes (Addendum)
Patient's HR keeps jumping up to 170s for seconds and then back to 110s. It stayed in 160s for approximately 1 minute. Patient has no complaints. Paged [facility]. New order for 5mg  IV lopressor. Continue to monitor.

## 2017-02-03 NOTE — Progress Notes (Signed)
Patient's HR intermittently increasing to 160s for seconds and then back to 110s. Patient has no complaints. New order for 2.5mg  Iv lopressor. Continue to monitor.

## 2017-02-03 NOTE — Consult Note (Signed)
Cardiology Consultation:   Patient ID: Parker [name]; [id number]; [birth year]   Admit date: 01/27/2017 Date of Consult: 02/03/2017  Primary Care Provider: Seward Carol, MD Primary Cardiologist: Addiel Mccardle  Primary Electrophysiologist:     Patient Profile:   Parker [name] is a 84 y.o. male with a hx of atrial fibrillation, aortic stenosis, prostate cancer, hyperlipidemia  who is being seen today for the evaluation of  rapid atrial fibrillation at the request of Dr. Zigmund Daniel.  History of Present Illness:   Parker [name] is a 84 year old gentleman who has known for many years.  He has had permanent atrial fibrillation.  He has had some aortic stenosis.  I last saw him in the office in May, 2017.  His last echocardiogram was in September, 2016.  He had  normal well-preserved left ventricular systolic function with an ejection fraction of 55-65%.  He had moderate aortic stenosis at that time.   The mean aortic valve gradient was 28 mmHg.  Was admitted to [facility] long hospital on January 31 with increased confusion. Have rapid atrial fibrillation.  His lactic acid level was elevated.  His white blood cell count was 17,000 and his urinalysis was consistent with a urinary tract infection.  He appears to have urosepsis and possibly may have pneumonia.  Was found to have acute renal insufficiency.  In March, 2018 his creatinine was 0.65.  When he was admitted on January 30, his creatinine was 2.5 and increased as high as 3.0.  His creatinine has improved this morning with antibiotic treatment for his urinary tract infection.   Past Medical History:  Diagnosis Date  . Aortic stenosis   . Aortic stenosis   . Atrial fibrillation ([place])   . Bilateral leg edema   . Cardiac murmur   . Combined fat and carbohydrate induced hyperlipemia 10/28/2014  . Diabetes mellitus without complication ([place])   . Dyslipidemia   . Elevated brain natriuretic peptide (BNP) level 09/01/2014  . Hyperlipidemia 10/28/2014  . LBP (low  back pain) 10/28/2014  . Neurogenic bladder    self cath at home  . Prostate cancer ([place])   . Vitamin B deficiency     Past Surgical History:  Procedure Laterality Date  . ORIF HUMERUS FRACTURE     Open reduction and internal fixation of transcondylar distal humerus fracture with intercondylar extension   . RADIAL HEAD ARTHROPLASTY       Home Medications:  Prior to Admission medications   Medication Sig Start Date End Date Taking? Authorizing Provider  acetaminophen (TYLENOL) 500 MG tablet Take 500 mg by mouth every 8 (eight) hours.    Yes [provider]  bicalutamide (CASODEX) 50 MG tablet Take 50 mg by mouth daily.   Yes [provider]  ferrous sulfate 325 (65 FE) MG tablet Take 1 tablet (325 mg total) by mouth 2 (two) times daily with a meal. 03/09/16  Yes Gherghe, Vella Redhead, MD  furosemide (LASIX) 40 MG tablet Take 40 mg by mouth every evening.  03/25/10  Yes [provider]  furosemide (LASIX) 80 MG tablet Take 160 mg by mouth daily.  01/09/17  Yes [provider]  Menthol 0.1 % LOTN Apply 1 application topically every 12 (twelve) hours as needed (redness).   Yes [provider]  metFORMIN (GLUCOPHAGE) 500 MG tablet Take 500 mg by mouth daily with breakfast.  01/09/17  Yes [provider]  metoprolol succinate (TOPROL XL) 25 MG 24 hr tablet Take 0.5 tablets (12.5 mg total) by mouth daily.  02/17/15  Yes Izic Stfort, Wonda Cheng, MD  pantoprazole (PROTONIX) 40 MG tablet Take 1 tablet (40 mg total) by mouth daily. 03/09/16  Yes Gherghe, Vella Redhead, MD  Polyvinyl Alcohol-Povidone PF (REFRESH) 1.4-0.6 % SOLN Apply 1 drop to eye daily.   Yes [provider]  pravastatin (PRAVACHOL) 20 MG tablet Take 10 mg by mouth at bedtime.  01/18/17  Yes [provider]  Sennosides-Docusate Sodium (SENEXON-S PO) Take 1 tablet by mouth daily. Hold for loose stools    Yes [provider]  cyanocobalamin (,VITAMIN B-12,) 1000 MCG/ML injection  Inject 1,000 mcg into the muscle every 30 (thirty) days.  03/06/15   [provider]  Dextromethorphan-Guaifenesin (ROBAFEN DM) 10-100 MG/5ML liquid Take 10 mLs by mouth every 6 (six) hours as needed (cough).    [provider]  HYDROcodone-acetaminophen (NORCO/VICODIN) 5-325 MG tablet Take 1 tablet by mouth 2 (two) times daily as needed for moderate pain. 03/09/16   Caren Griffins, MD  levofloxacin (LEVAQUIN) 500 MG tablet Take 1 tablet (500 mg total) by mouth daily. For 5 more days Patient not taking: Reported on 01/16/2017 03/09/16   Caren Griffins, MD  metFORMIN (GLUCOPHAGE-XR) 500 MG 24 hr tablet Take 500 mg by mouth daily.  02/02/15   [provider]  polyethylene glycol (MIRALAX / GLYCOLAX) packet Take 17 g by mouth daily as needed (for constipation).    [provider]  pravastatin (PRAVACHOL) 10 MG tablet Take 10 mg by mouth at bedtime.     [provider]    Inpatient Medications: Scheduled Meds: . bicalutamide  50 mg Oral Daily  . diltiazem  30 mg Oral Q6H  . ferrous sulfate  325 mg Oral BID WC  . insulin aspart  0-9 Units Subcutaneous TID WC  . metoprolol succinate  12.5 mg Oral Daily  . pantoprazole  40 mg Oral Daily  . polyvinyl alcohol  1 drop Both Eyes Daily  . pravastatin  10 mg Oral QHS   Continuous Infusions: . ceFEPime (MAXIPIME) IV Stopped (02/02/17 2154)   PRN Meds: acetaminophen **OR** acetaminophen, albuterol, ondansetron **OR** ondansetron (ZOFRAN) IV, polyethylene glycol, senna-docusate  Allergies:   No Known Allergies  Social History:   Social History   Socioeconomic History  . Marital status: Widowed    Spouse name: Not on file  . Number of children: Not on file  . Years of education: Not on file  . Highest education level: Not on file  Social Needs  . Financial resource strain: Not on file  . Food insecurity - worry: Not on file  . Food insecurity - inability: Not on file  . Transportation needs -  medical: Not on file  . Transportation needs - non-medical: Not on file  Occupational History  . Not on file  Tobacco Use  . Smoking status: Former Research scientist (life sciences)  . Smokeless tobacco: Never Used  Substance and Sexual Activity  . Alcohol use: No  . Drug use: No  . Sexual activity: No  Other Topics Concern  . Not on file  Social History Narrative  . Not on file    Family History:    Family History  Problem Relation Age of Onset  . Hypertension Other      ROS:  Please see the history of present illness.   All other ROS reviewed and negative.     Physical Exam/Data:   Vitals:   02/03/17 0500 02/03/17 0600 02/03/17 0800 02/03/17 0830  BP: (!) 125/102 116/60 (!) 111/53  Pulse: (!) 101 (!) 51 (!) 55   Resp: 17 19 16    Temp:    (!) 97.5 F (36.4 C)  TempSrc:    Axillary  SpO2: 97% 95% 90%   Weight:      Height:        Intake/Output Summary (Last 24 hours) at 02/03/2017 1046 Last data filed at 02/03/2017 0407 Gross per 24 hour  Intake 1710 ml  Output 1150 ml  Net 560 ml   Filed Weights   02/02/17 0018 02/02/17 0303 02/03/17 0408  Weight: 200 lb (90.7 kg) 180 lb 8.9 oz (81.9 kg) 184 lb 11.9 oz (83.8 kg)   Body mass index is 26.51 kg/m.  General: Chronically ill-appearing gentleman. HEENT: normal Lymph: no adenopathy Neck: no JVD Endocrine:  No thryomegaly Vascular: No carotid bruits; FA pulses 2+ bilaterally without bruits  Cardiac: Irregularly irregular.  He has a 2/6 to 3/6 systolic ejection murmur the left sternal border. Lungs:  clear to auscultation bilaterally, no wheezing, rhonchi or rales  Abd: soft, nontender, no hepatomegaly  Ext: no edema Musculoskeletal:  No deformities, BUE and BLE strength normal and equal Skin: warm and dry  Neuro:  CNs 2-12 intact, no focal abnormalities noted Psych:  Normal affect   EKG:  The EKG was personally reviewed and demonstrates:  Afib with RVR  Telemetry:  Telemetry was personally reviewed and demonstrates:   Afib with HR  137.   Relevant CV Studies:   Laboratory Data:  Chemistry Recent Labs  Lab 02/02/17 0329 02/02/17 1143 02/03/17 0302  NA 135 136 138  K 3.8 3.4* 3.3*  CL 97* 98* 102  CO2 24 26 26   GLUCOSE 193* 174* 125*  BUN 63* 53* 48*  CREATININE 3.00* 2.14* 1.33*  CALCIUM 8.5* 8.5* 8.6*  GFRNONAA 17* 25* 45*  GFRAA 20* 29* 52*  ANIONGAP 14 12 10     Recent Labs  Lab 01/13/2017 1741  PROT 7.1  ALBUMIN 3.9  AST 23  ALT 12*  ALKPHOS 111  BILITOT 0.7   Hematology Recent Labs  Lab 02/02/17 0329 02/02/17 1143 02/03/17 0302  WBC 20.1* 21.5* 21.8*  RBC 4.26 3.94* 3.74*  HGB 13.7 12.7* 11.9*  HCT 40.4 38.1* 35.7*  MCV 94.8 96.7 95.5  MCH 32.2 32.2 31.8  MCHC 33.9 33.3 33.3  RDW 14.1 13.9 14.2  PLT 227 212 188   Cardiac Enzymes Recent Labs  Lab 02/02/17 0058 02/02/17 0329 02/02/17 0952  TROPONINI <0.03 <0.03 <0.03    Recent Labs  Lab 01/24/2017 1753  TROPIPOC 0.01    BNP Recent Labs  Lab 01/05/2017 1741  BNP 208.2*    DDimer No results for input(s): DDIMER in the last 168 hours.  Radiology/Studies:  Dg Chest 1 View  Result Date: 02/03/2017 CLINICAL DATA:  Hypoxemia.  Follow-up exam. EXAM: CHEST 1 VIEW COMPARISON:  02/02/2017 and older studies. FINDINGS: Cardiac silhouette top-normal in size. Left lung base opacity partly silhouettes the hemidiaphragm. Milder opacity at the medial right lung base. These areas are likely due to atelectasis, possibly with small effusions. Mild hazy opacity is noted in the central right upper lobe similar to the prior exam. This may reflect atelectasis or pneumonia. Mild asymmetric interstitial edema is possible. No new lung abnormalities. No pneumothorax. IMPRESSION: 1. No significant change from the previous day's study. 2. Left greater than right lung base opacities with mild central right upper lobe hazy lung opacity. Lung base opacity is most likely atelectasis, possible small effusions. Central right upper lung  opacity may reflect  atelectasis or be due to infection possibly asymmetric edema. Electronically Signed   By: Lajean Manes M.D.   On: 02/03/2017 08:53   Dg Chest 1 View  Result Date: 02/02/2017 CLINICAL DATA:  Follow-up right upper lobe opacity. EXAM: CHEST 1 VIEW COMPARISON:  02/02/2016.  03/09/2016.  03/03/2016. FINDINGS: Mediastinum hilar structures normal. Persistent atelectatic changes/infiltrate right upper lobe. Continued follow-up exam can be obtained to demonstrate clearing. Mild left base atelectasis/infiltrate on today's exam. Small left pleural effusion cannot be excluded. No pneumothorax. Stable cardiomegaly. Hiatal hernia. IMPRESSION: 1. Persistent atelectasis/infiltrate right upper lobe. Continued follow-up chest x-rays can be obtained to demonstrate clearing. 2. New left base subsegmental atelectasis/infiltrate. Pleural effusion cannot be excluded. 3.  Stable cardiomegaly. 4.  Hiatal hernia again noted. Electronically Signed   By: Marcello Moores  Register   On: 02/02/2017 14:57   Ct Head Wo Contrast  Result Date: 01/31/2017 CLINICAL DATA:  Altered level of consciousness.  03/04/2016 EXAM: CT HEAD WITHOUT CONTRAST TECHNIQUE: Contiguous axial images were obtained from the base of the skull through the vertex without intravenous contrast. COMPARISON:  03/04/2016 FINDINGS: Brain: There is mild diffuse low-attenuation within the subcortical and periventricular white matter compatible with chronic microvascular disease. There is prominence of the sulci and ventricles compatible with brain atrophy. No evidence of acute infarction, intracranial hemorrhage or mass. No abnormal extra-axial fluid collections identified. No mass effect or midline shift identified. Vascular: No hyperdense vessel or unexpected calcification. Skull: The paranasal sinuses are clear. The mastoid air cells are also clear. The calvarium appears intact. Sinuses/Orbits: No acute finding. Other: None. IMPRESSION: 1. No acute intracranial abnormalities. 2.  Chronic small vessel ischemic change and brain atrophy. Electronically Signed   By: Kerby Moors M.D.   On: 01/14/2017 18:46   US Renal  Result Date: 02/02/2017 CLINICAL DATA:  Acute renal failure. EXAM: RENAL / URINARY TRACT ULTRASOUND COMPLETE COMPARISON:  None FINDINGS: Right Kidney: Length: 10.1 cm. Stone within the mid right kidney measures 7.3 mm. There are 3 cysts noted in the right kidney. The largest is in the inferior pole measuring 2 x 1.6 x 1.8 cm. No mass or hydronephrosis. Left Kidney: Length: 10.6 cm. Echogenicity within normal limits. No mass or hydronephrosis visualized. Bladder: Appears normal for degree of bladder distention. IMPRESSION: 1. No hydronephrosis. 2. Left kidney cysts. 3. Left kidney stone. Electronically Signed   By: Kerby Moors M.D.   On: 02/02/2017 15:08   Dg Chest Port 1 View  Result Date: 01/20/2017 CLINICAL DATA:  Shortness of Breath EXAM: PORTABLE CHEST 1 VIEW COMPARISON:  03/09/2016 FINDINGS: Large hiatal hernia. Mild cardiomegaly. Turn for airspace opacity in the medial right upper lobe. No effusions or acute bony abnormality. IMPRESSION: Cardiomegaly. Large hiatal hernia. Concern for right upper lobe opacity/pneumonia. Electronically Signed   By: Rolm Baptise M.D.   On: 01/26/2017 17:32    Assessment and Plan:   1. Atrial fib with RVR:   Mr. Heggs overall continues to gradually deteriorate.  His HR was faster in response to the sepsis syndrome.  I do not think that he needs any additional medications.  I think that his heart rate will improve as his sepsis syndrome improves.  2.  Aortic stenosis: His aortic stenosis was moderate coronary and an echocardiogram in 2016.  His pulse pressure may be a little bit diminished.  His murmur is very difficult to hear.  I do not think he needs an echocardiogram because he is not a candidate for aortic  valve replacement for TAVR.  3.  Urosepsis: Continue antibiotic therapy.  Further plans per Dr. Zigmund Daniel  I had  a long discussion with his son [name].  I agree with the DNR status.  Mr. Seubert does not want to be on a chronic ventilator does not want resuscitative measures. Unfortunately, I see that his health has progressively and steadily declined.  I think that a palliative care consult is indicated.  Please call us if we can be of any further assistance.  For questions or updates, please contact [place] Please consult [email or web address] for contact info under Cardiology/STEMI.   Signed, Mertie Moores, MD  02/03/2017 10:46 AM

## 2017-02-03 NOTE — Progress Notes (Signed)
Date: February 03, 2017 Stephenie Acres with [facility] [facility] and [facility] care/referral given for end of life issues. Velva Harman, BSN, [place], CCM:  [phone]

## 2017-02-03 NOTE — Progress Notes (Signed)
[facility] Liaison: RN visit  Notified by Velva Harman, [facility] of patient/family request for [facility] services at [facility]  after discharge. Chart and patient information under review by [facility] physician. Hospice eligibility pending at this time.  Writer spoke with patient and son, [name] at bedside to initiate education related to hospice philosophy, services and team approach to care. verbalized understanding of information given. Per discussion, plan is for discharge to [facility] by [facility], day not yet determined.   Please send signed and completed DNR form home with patient/family. Patient will need prescriptions for discharge comfort medications. DME needs have been discussed, patient currently has the following equipment in the home: Hospital Bed, W/C, walker. Patient/family requests the following DME for delivery to the home: none.   [facility] aware of the above. Please notify [facility] when patient is ready to leave the unit at discharge. (Call [phone] or [phone] after 5pm.) [facility] information and contact numbers given to at time of visit. Above information shared with [facility],CMRN.  Please call with any hospice related questions.  Thank you for this referral.  Farrel Gordon, RN, [facility] Liaison [phone] ? [facility] liaisons are now on [place].

## 2017-02-03 NOTE — Progress Notes (Addendum)
Approximately the last 15 minutes, the patient's HR has increased to 160s for seconds a couple of times. The patient still has no complaints. He did wake up confused pulling his tele wires off. We were able to orient him easily. Paged [facility]. Another dose 2.5mg  IV lopressor ordered and given. Continue to monitor.

## 2017-02-03 NOTE — Progress Notes (Signed)
PROGRESS NOTE    Parker [name]  PIR:[id number] DOB: Mar 27, 1923 DOA: 01/03/2017 PCP: Seward Carol, MD  Brief Narrative: 84 yo malewithhistory of atrial fibrillation, diabetes mellitus type 2, GI bleed, aortic stenosis and prostate cancer was brought to the ER after patient was found to be increasingly confused since morning. As per the patient's son patient also was mildly hypoxic or did not have any nausea vomiting diarrhea chest pain or shortness of breath. No change in medications recently.  ED Course:In the ER patient initially was found to be in A. fib with RVR which improved with Cardizem bolus. Lactate was elevated at around 3.4 which further worsened at 5 despite fluids. WBC count was 17,000 with UA showing features consistent with UTI and chest x-ray showing possible right sided pneumonia. Patient was started on empiric antibiotics after cultures obtained and admitted for developing sepsis from UTI and possible pneumonia. As per the patient's and patient is mostly bedbound and uses a wheelchair to move around. CT head done due to acute encephalopathy was unremarkable. 02/02/2017 patient awake.  Sent by the bedside.  He feels his breathing is better but still short of breath and dyspneic on exertion.  02/03/2017 discussed in detail with patient's son who is patient's POA.  Will get [facility] involved.  Patient has been having A. fib RVR overnight.  He has received some extra doses of Lopressor IV to control the heart rate.  Assessment & Plan:   Principal Problem:   SIRS (systemic inflammatory response syndrome) (HCC) Active Problems:   Diabetes type 2, controlled (HCC)   Aortic stenosis   Atrial fibrillation (HCC)   Acute encephalopathy   Pneumonia   1Sepsis due to HCAP-patient admitted with shortness of breath and cough.  Chest x-ray showed possible early right upper lobe pneumonia.  Patient coming from nursing home.  According to family patient has had multiple  episodes of pneumonia.  He was started on vancomycin and cefepime empirically.  MRSA PCR is negative will DC vancomycin today.  His lactate level has been decreasing with IV hydration.  Start nebulizer treatments with Xopenex.  Discussed with infectious disease over the phone.  Plan to continue cefepime for now.  2] A. fib RVR upon admission status post 1 dose of Cardizem bolus.  Patient takes metoprolol which is being continued at this time is not on any anticoagulation due to history of GI bleed.  He is on metoprolol 12.5 mg once a day his blood pressure has been soft.  His heart rate has been anywhere from 128-37.  The last documented heart rate to have is 37 while I was seeing the patient his heart rate was in the 90s.  Patient's son does not want anything aggressive done at this time.  Patient is DO NOT RESUSCITATE his son [name] has the POA.  Monitor closely.  Also has a history of moderate aortic stenosis which is being managed medically.  Discussed with Dr. Acie Fredrickson  cardiology over the phone.  3] status post acute encephalopathy secondary to HCAP.  Head CT shows no acute changes but atrophy consistent with age.  4] leukocytosis-due to healthcare associated pneumonia.  UA showed moderate amount of leukocytes with negative nitrites.  Final urine culture is pending at this time.  Patient does have a chronic Foley catheter in place with a history of prostate cancer on Casodex treatment as an outpatient.  He does have a history of recurrent chronic UTI.  5] acute renal failure with a creatinine of 3.0  today last creatinine 0.6 from a few months ago.  Possibly secondary to dehydration and sepsis.  I will continue hydration cautiously.  And monitor his renal functions.  Patient was on metformin as an outpatient.    DVT prophylaxis: scd Code Status:dnr Family Communication:dw son Disposition Plan: tbd Consultants:   cards dw id on phone Procedures:  none Antimicrobials:cefepime  Subjective: Resting awake says breathing is ok  Objective: Vitals:   02/03/17 0830 02/03/17 1200 02/03/17 1228 02/03/17 1400  BP:  102/64  127/87  Pulse:  (!) 116  (!) 48  Resp:  17  18  Temp: (!) 97.5 F (36.4 C)  97.9 F (36.6 C)   TempSrc: Axillary  Axillary   SpO2:  98%  99%  Weight:      Height:        Intake/Output Summary (Last 24 hours) at 02/03/2017 1529 Last data filed at 02/03/2017 1238 Gross per 24 hour  Intake 1785 ml  Output 1400 ml  Net 385 ml   Filed Weights   02/02/17 0018 02/02/17 0303 02/03/17 0408  Weight: 90.7 kg (200 lb) 81.9 kg (180 lb 8.9 oz) 83.8 kg (184 lb 11.9 oz)    Examination:  General exam: Appears calm and comfortable  Respiratory system: rhonchi to auscultation. Respiratory effort normal. Cardiovascular system: S1 & S2 heard, RRR. No JVD, murmurs, rubs, gallops or clicks. No pedal edema. Gastrointestinal system: Abdomen is nondistended, soft and nontender. No organomegaly or masses felt. Normal bowel sounds heard. Central nervous system: Alert and oriented. No focal neurological deficits. Extremities: Symmetric 5 x 5 power. Skin: No rashes, lesions or ulcers Psychiatry: Judgement and insight appear normal. Mood & affect appropriate.     Data Reviewed: I have personally reviewed following labs and imaging studies  CBC: Recent Labs  Lab 01/04/2017 1741 02/02/17 0329 02/02/17 1143 02/03/17 0302  WBC 17.4* 20.1* 21.5* 21.8*  NEUTROABS 15.0*  --   --   --   HGB 13.1 13.7 12.7* 11.9*  HCT 39.4 40.4 38.1* 35.7*  MCV 97.3 94.8 96.7 95.5  PLT 267 227 212 921   Basic Metabolic Panel: Recent Labs  Lab 01/17/2017 1741 02/02/17 0329 02/02/17 1143 02/03/17 0302  NA 136 135 136 138  K 3.8 3.8 3.4* 3.3*  CL 95* 97* 98* 102  CO2 26 24 26 26   GLUCOSE 179* 193* 174* 125*  BUN 63* 63* 53* 48*  CREATININE 2.54* 3.00* 2.14* 1.33*  CALCIUM 8.8* 8.5* 8.5* 8.6*   GFR: Estimated Creatinine Clearance: 37.4  mL/min (A) (by C-G formula based on SCr of 1.33 mg/dL (H)). Liver Function Tests: Recent Labs  Lab 01/22/2017 1741  AST 23  ALT 12*  ALKPHOS 111  BILITOT 0.7  PROT 7.1  ALBUMIN 3.9   Recent Labs  Lab 01/21/2017 1741  LIPASE 25   No results for input(s): AMMONIA in the last 168 hours. Coagulation Profile: Recent Labs  Lab 01/16/2017 1741  INR 1.08   Cardiac Enzymes: Recent Labs  Lab 02/02/17 0058 02/02/17 0329 02/02/17 0952  TROPONINI <0.03 <0.03 <0.03   BNP (last 3 results) No results for input(s): PROBNP in the last 8760 hours. HbA1C: No results for input(s): HGBA1C in the last 72 hours. CBG: Recent Labs  Lab 02/02/17 1150 02/02/17 1608 02/02/17 2105 02/03/17 0831 02/03/17 1214  GLUCAP 155* 130* 118* 113* 115*   Lipid Profile: No results for input(s): CHOL, HDL, LDLCALC, TRIG, CHOLHDL, LDLDIRECT in the last 72 hours. Thyroid Function Tests: Recent Labs  01/14/2017 1757  TSH 3.917   Anemia Panel: No results for input(s): VITAMINB12, FOLATE, FERRITIN, TIBC, IRON, RETICCTPCT in the last 72 hours. Sepsis Labs: Recent Labs  Lab 01/30/2017 1756 01/13/2017 2139 02/02/17 0058 02/02/17 0059 02/02/17 0329  PROCALCITON  --   --  1.98  --   --   LATICACIDVEN 3.45* 5.03*  --  4.0* 3.7*    Recent Results (from the past 240 hour(s))  Culture, blood (routine x 2)     Status: None (Preliminary result)   Collection Time: 01/21/2017  6:01 PM  Result Value Ref Range Status   Specimen Description   Final    BLOOD LEFT ANTECUBITAL Performed at [facility], [place] [address] [place], [place] [address]    Special Requests   Final    BOTTLES DRAWN AEROBIC AND ANAEROBIC Blood Culture adequate volume Performed at [facility] [address] [place], [place] [address]    Culture   Final    NO GROWTH 2 DAYS Performed at [facility] [address], [place], [place] [address]    Report Status PENDING  Incomplete  Culture, blood  (routine x 2)     Status: None (Preliminary result)   Collection Time: 01/28/2017  6:14 PM  Result Value Ref Range Status   Specimen Description   Final    BLOOD RIGHT WRIST Performed at [facility] [address] [place], [place] [address]    Special Requests   Final    BOTTLES DRAWN AEROBIC AND ANAEROBIC Blood Culture adequate volume Performed at [facility] [address] [place],  [address]    Culture   Final    NO GROWTH 2 DAYS Performed at [facility] [address], [place],  [address]    Report Status PENDING  Incomplete  MRSA PCR Screening     Status: None   Collection Time: 02/02/17  2:53 AM  Result Value Ref Range Status   MRSA by PCR NEGATIVE NEGATIVE Final    Comment:        The GeneXpert MRSA Assay (FDA approved for NASAL specimens only), is one component of a comprehensive MRSA colonization surveillance program. It is not intended to diagnose MRSA infection nor to guide or monitor treatment for MRSA infections.          Radiology Studies: Dg Chest 1 View  Result Date: 02/03/2017 CLINICAL DATA:  Hypoxemia.  Follow-up exam. EXAM: CHEST 1 VIEW COMPARISON:  02/02/2017 and older studies. FINDINGS: Cardiac silhouette top-normal in size. Left lung base opacity partly silhouettes the hemidiaphragm. Milder opacity at the medial right lung base. These areas are likely due to atelectasis, possibly with small effusions. Mild hazy opacity is noted in the central right upper lobe similar to the prior exam. This may reflect atelectasis or pneumonia. Mild asymmetric interstitial edema is possible. No new lung abnormalities. No pneumothorax. IMPRESSION: 1. No significant change from the previous day's study. 2. Left greater than right lung base opacities with mild central right upper lobe hazy lung opacity. Lung base opacity is most likely atelectasis, possible small effusions. Central right upper lung opacity may  reflect atelectasis or be due to infection possibly asymmetric edema. Electronically Signed   By: Lajean Manes M.D.   On: 02/03/2017 08:53   Dg Chest 1 View  Result Date: 02/02/2017 CLINICAL DATA:  Follow-up right upper lobe opacity. EXAM: CHEST 1 VIEW COMPARISON:  02/02/2016.  03/09/2016.  03/03/2016. FINDINGS:  Mediastinum hilar structures normal. Persistent atelectatic changes/infiltrate right upper lobe. Continued follow-up exam can be obtained to demonstrate clearing. Mild left base atelectasis/infiltrate on today's exam. Small left pleural effusion cannot be excluded. No pneumothorax. Stable cardiomegaly. Hiatal hernia. IMPRESSION: 1. Persistent atelectasis/infiltrate right upper lobe. Continued follow-up chest x-rays can be obtained to demonstrate clearing. 2. New left base subsegmental atelectasis/infiltrate. Pleural effusion cannot be excluded. 3.  Stable cardiomegaly. 4.  Hiatal hernia again noted. Electronically Signed   By: Marcello Moores  Register   On: 02/02/2017 14:57   Ct Head Wo Contrast  Result Date: 01/09/2017 CLINICAL DATA:  Altered level of consciousness.  03/04/2016 EXAM: CT HEAD WITHOUT CONTRAST TECHNIQUE: Contiguous axial images were obtained from the base of the skull through the vertex without intravenous contrast. COMPARISON:  03/04/2016 FINDINGS: Brain: There is mild diffuse low-attenuation within the subcortical and periventricular white matter compatible with chronic microvascular disease. There is prominence of the sulci and ventricles compatible with brain atrophy. No evidence of acute infarction, intracranial hemorrhage or mass. No abnormal extra-axial fluid collections identified. No mass effect or midline shift identified. Vascular: No hyperdense vessel or unexpected calcification. Skull: The paranasal sinuses are clear. The mastoid air cells are also clear. The calvarium appears intact. Sinuses/Orbits: No acute finding. Other: None. IMPRESSION: 1. No acute intracranial  abnormalities. 2. Chronic small vessel ischemic change and brain atrophy. Electronically Signed   By: Kerby Moors M.D.   On: 01/22/2017 18:46   US Renal  Result Date: 02/02/2017 CLINICAL DATA:  Acute renal failure. EXAM: RENAL / URINARY TRACT ULTRASOUND COMPLETE COMPARISON:  None FINDINGS: Right Kidney: Length: 10.1 cm. Stone within the mid right kidney measures 7.3 mm. There are 3 cysts noted in the right kidney. The largest is in the inferior pole measuring 2 x 1.6 x 1.8 cm. No mass or hydronephrosis. Left Kidney: Length: 10.6 cm. Echogenicity within normal limits. No mass or hydronephrosis visualized. Bladder: Appears normal for degree of bladder distention. IMPRESSION: 1. No hydronephrosis. 2. Left kidney cysts. 3. Left kidney stone. Electronically Signed   By: Kerby Moors M.D.   On: 02/02/2017 15:08   Dg Chest Port 1 View  Result Date: 01/03/2017 CLINICAL DATA:  Shortness of Breath EXAM: PORTABLE CHEST 1 VIEW COMPARISON:  03/09/2016 FINDINGS: Large hiatal hernia. Mild cardiomegaly. Turn for airspace opacity in the medial right upper lobe. No effusions or acute bony abnormality. IMPRESSION: Cardiomegaly. Large hiatal hernia. Concern for right upper lobe opacity/pneumonia. Electronically Signed   By: Rolm Baptise M.D.   On: 01/26/2017 17:32        Scheduled Meds: . bicalutamide  50 mg Oral Daily  . chlorhexidine  15 mL Mouth Rinse BID  . diltiazem  30 mg Oral Q6H  . ferrous sulfate  325 mg Oral BID WC  . insulin aspart  0-9 Units Subcutaneous TID WC  . mouth rinse  15 mL Mouth Rinse q12n4p  . metoprolol succinate  12.5 mg Oral Daily  . pantoprazole  40 mg Oral Daily  . polyvinyl alcohol  1 drop Both Eyes Daily  . pravastatin  10 mg Oral QHS   Continuous Infusions: . ceFEPime (MAXIPIME) IV Stopped (02/03/17 1300)     LOS: 2 days      Georgette Shell, MD [facility] If 7PM-7AM, please contact night-coverage [email or web address] Password [facility] 02/03/2017, 3:29 PM

## 2017-02-03 DEATH — deceased

## 2017-02-04 DIAGNOSIS — K5900 Constipation, unspecified: Secondary | ICD-10-CM

## 2017-02-04 DIAGNOSIS — E876 Hypokalemia: Secondary | ICD-10-CM

## 2017-02-04 DIAGNOSIS — R6510 Systemic inflammatory response syndrome (SIRS) of non-infectious origin without acute organ dysfunction: Secondary | ICD-10-CM

## 2017-02-04 LAB — GLUCOSE, CAPILLARY
GLUCOSE-CAPILLARY: 118 mg/dL — AB (ref 65–99)
GLUCOSE-CAPILLARY: 140 mg/dL — AB (ref 65–99)
Glucose-Capillary: 127 mg/dL — ABNORMAL HIGH (ref 65–99)
Glucose-Capillary: 135 mg/dL — ABNORMAL HIGH (ref 65–99)
Glucose-Capillary: 126 mg/dL — ABNORMAL HIGH (ref 65–99)

## 2017-02-04 LAB — BASIC METABOLIC PANEL
Anion gap: 13 (ref 5–15)
BUN: 46 mg/dL — ABNORMAL HIGH (ref 6–20)
CALCIUM: 9 mg/dL (ref 8.9–10.3)
CO2: 23 mmol/L (ref 22–32)
CREATININE: 0.92 mg/dL (ref 0.61–1.24)
Chloride: 105 mmol/L (ref 101–111)
GFR calc Af Amer: >60 mL/min (ref >60)
GLUCOSE: 139 mg/dL — AB (ref 65–99)
Potassium: 2.8 mmol/L — ABNORMAL LOW (ref 3.5–5.1)
Sodium: 141 mmol/L (ref 135–145)

## 2017-02-04 LAB — CBC
HCT: 35.7 % — ABNORMAL LOW (ref 39.0–52.0)
HEMOGLOBIN: 12.1 g/dL — AB (ref 13.0–17.0)
MCH: 32.4 pg (ref 26.0–34.0)
MCHC: 33.9 g/dL (ref 30.0–36.0)
MCV: 95.7 fL (ref 78.0–100.0)
PLATELETS: 212 10*3/uL (ref 150–400)
RBC: 3.73 MIL/uL — ABNORMAL LOW (ref 4.22–5.81)
RDW: 14.2 % (ref 11.5–15.5)
WBC: 11.5 10*3/uL — ABNORMAL HIGH (ref 4.0–10.5)

## 2017-02-04 MED ORDER — POTASSIUM CHLORIDE 10 MEQ/100ML IV SOLN
10.0000 meq | INTRAVENOUS | Status: AC
  Administered 2017-02-04 (×2): 10 meq via INTRAVENOUS

## 2017-02-04 MED ORDER — DEXTROSE 5 % IV SOLN
1.0000 g | Freq: Three times a day (TID) | INTRAVENOUS | Status: DC
  Administered 2017-02-04 – 2017-02-05 (×2): 1 g via INTRAVENOUS
  Filled 2017-02-04 (×4): qty 1

## 2017-02-04 MED ORDER — POTASSIUM CHLORIDE 10 MEQ/100ML IV SOLN: 10.0000 meq | INTRAVENOUS | Status: DC

## 2017-02-04 MED ORDER — POLYETHYLENE GLYCOL 3350 17 G PO PACK
17.0000 g | PACK | Freq: Every day | ORAL | Status: DC
  Administered 2017-02-04 – 2017-02-05 (×2): 17 g via ORAL
  Filled 2017-02-04 (×2): qty 1

## 2017-02-04 MED ORDER — POTASSIUM CHLORIDE CRYS ER 20 MEQ PO TBCR
40.0000 meq | EXTENDED_RELEASE_TABLET | Freq: Three times a day (TID) | ORAL | Status: DC
  Administered 2017-02-04 – 2017-02-05 (×4): 40 meq via ORAL
  Filled 2017-02-04 (×4): qty 2

## 2017-02-04 MED ORDER — BISACODYL 10 MG RE SUPP
10.0000 mg | Freq: Once | RECTAL | Status: AC
  Administered 2017-02-04: 10 mg via RECTAL
  Filled 2017-02-04: qty 1

## 2017-02-04 NOTE — Progress Notes (Signed)
Pharmacy Antibiotic Note  Parker [name] is a 84 y.o. male admitted on 01/25/2017 with PNA and UTI.  Pharmacy has been consulted for cefepime dosing.  Today, 02/04/2017: Day # 3 Cefepime  Afebrile, Tmin 97.5  WBC improved 21.8 > 11.5  SCr improved to 0.92, Crcl ~ 54 ml/min  Suspect UCx 40k Enterococcus may be colonization.  Currently improving w/o treatment.  TRH will monitor and change abx if worsening.  Plan: Increase to cefepime 1gm IV q8hr Follow up renal fxn, culture results, and clinical course.   Height: 5\' 10"  (177.8 cm) Weight: 183 lb 10.3 oz (83.3 kg) IBW/kg (Calculated) : 73  Temp (24hrs), Avg:98 F (36.7 C), Min:97.5 F (36.4 C), Max:98.7 F (37.1 C)  Recent Labs  Lab 01/23/2017 1741 01/11/2017 1756 01/10/2017 2139 02/02/17 0059 02/02/17 0329 02/02/17 1143 02/03/17 0302 02/04/17 0850  WBC 17.4*  --   --   --  20.1* 21.5* 21.8* 11.5*  CREATININE 2.54*  --   --   --  3.00* 2.14* 1.33* 0.92  LATICACIDVEN  --  3.45* 5.03* 4.0* 3.7*  --   --   --     Estimated Creatinine Clearance: 54 mL/min (by C-G formula based on SCr of 0.92 mg/dL).    No Known Allergies  Antimicrobials this admission: 1/30 Azithromycin >> x1 only 1/30 Ceftriaxone >> x1 only 1/30 Cefepime >> (2/8) 1/30 Vancomycin >> 1/31  Dose adjustments this admission:   Microbiology results: 1/31 BCx: ngtd Sputum ordered 1/31 MRSA PCR: neg 1/31 Strep pneumo ur ag: neg 1/31 influenza a/b: neg 1/31 UCx: (collected after abx started): 40k Enterococcus faecalis (possible colonization)  Thank you for allowing pharmacy to be a part of this patient's care.  Gretta Arab PharmD, BCPS Pager [phone] 02/04/2017 2:16 PM

## 2017-02-04 NOTE — Progress Notes (Signed)
PROGRESS NOTE    Parker [name]  TDD:[id number] DOB: May 03, 1923 DOA: 01/25/2017 PCP: Seward Carol, MD  Brief Narrative:84 yo malewithhistory of atrial fibrillation, diabetes mellitus type 2, GI bleed, aortic stenosis and prostate cancer was brought to the ER after patient was found to be increasingly confused since morning. As per the patient's son patient also was mildly hypoxic or did not have any nausea vomiting diarrhea chest pain or shortness of breath. No change in medications recently.  ED Course:In the ER patient initially was found to be in A. fib with RVR which improved with Cardizem bolus. Lactate was elevated at around 3.4 which further worsened at 5 despite fluids. WBC count was 17,000 with UA showing features consistent with UTI and chest x-ray showing possible right sided pneumonia. Patient was started on empiric antibiotics after cultures obtained and admitted for developing sepsis from UTI and possible pneumonia. As per the patient's and patient is mostly bedbound and uses a wheelchair to move around. CT head done due to acute encephalopathy was unremarkable. 02/02/2017 patient awake.Sent by the bedside. He feels his breathing is better but still short of breath and dyspneic on exertion.  02/03/2017 discussed in detail with patient's son who is patient's POA.  Will get hospice of [place] involved.  Patient has been having A. fib RVR overnight.  He has received some extra doses of Lopressor IV to control the heart rate.   Assessment & Plan:   Principal Problem:   SIRS (systemic inflammatory response syndrome) (HCC) Active Problems:   Diabetes type 2, controlled (HCC)   Aortic stenosis   Atrial fibrillation (HCC)   Acute encephalopathy   Pneumonia 1Sepsis due to HCAP-patient admitted with shortness of breath and cough.Chest x-ray showed possible early right upper lobe pneumonia. Patient coming from nursing home. According to family patient has had multiple  episodes of pneumonia. He was started on vancomycin and cefepime empirically. MRSA PCR is negative will DC vancomycin today. His lactate level has been decreasing with IV hydration. Start nebulizer treatments with Xopenex.  Discussed with infectious disease over the phone.  Plan to continue cefepime for now.  2]A. fib RVR upon admission status post 1 dose of Cardizem bolus. Patient takes metoprolol which is being continued at this time is not on any anticoagulation due to history of GI bleed. He is on metoprolol 12.5 mg once a day his blood pressure has been soft. His heart rate has been anywhere from 128-37. The last documented heart rate to have is 37 while I was seeing the patient his heart rate was in the 90s. Patient's son does not want anything aggressive done at this time. Patient is DO NOT RESUSCITATE his son Parker [name] has thePOA. Monitor closely. Also has a history of moderate aortic stenosis which is being managed medically.  Discussed with Dr. Acie Fredrickson  cardiology over the phone.  3]status post acute encephalopathy secondary to HCAP.Head CT shows no acute changes but atrophy consistent with age.  4]leukocytosis-due to healthcare associated pneumonia.  Significant improvement in her WBC count today follow-up final urine culture. Patient does have a chronic Foley catheter in place with a history of prostate cancer on Casodex treatment  He does have a history of recurrent chronic UTI.  5]acute renal failure creatinine back to baseline after IV hydration DC IV hydration.   6]hypokalemia-replete and recheck.  7]constipation-no BMs documented.  His belly is distended.  Will start him on MiraLAX and Dulcolax.         DVT prophylaxis:scd Code Status  dnr Family Communication:dw son Disposition Plan: TBD  Consultants:  Cardiology Procedures: None Antimicrobials: Cefepime  Subjective: Breathing seems okay   Objective: He is awake in no acute distress Vitals:    02/04/17 0400 02/04/17 0425 02/04/17 0800 02/04/17 1006  BP:    (!) 136/97  Pulse:    (!) 107  Resp:      Temp: 98.7 F (37.1 C)  (!) 97.5 F (36.4 C)   TempSrc: Axillary  Axillary   SpO2:      Weight:  83.3 kg (183 lb 10.3 oz)    Height:        Intake/Output Summary (Last 24 hours) at 02/04/2017 1056 Last data filed at 02/04/2017 0425 Gross per 24 hour  Intake 75 ml  Output 1075 ml  Net -1000 ml   Filed Weights   02/02/17 0303 02/03/17 0408 02/04/17 0425  Weight: 81.9 kg (180 lb 8.9 oz) 83.8 kg (184 lb 11.9 oz) 83.3 kg (183 lb 10.3 oz)    Examination:  General exam: Appears calm and comfortable  Respiratory system: Clear to auscultation. Respiratory effort normal. Cardiovascular system: S1 & S2 heard, RRR. No JVD, murmurs, rubs, gallops or clicks. No pedal edema. Gastrointestinal system: Abdomen is distended, soft and nontender. No organomegaly or masses felt. Normal bowel sounds heard. Central nervous system: Alert and oriented. No focal neurological deficits. Extremities: Symmetric 5 x 5 power. Skin: No rashes, lesions or ulcers Psychiatry: Judgement and insight appear normal. Mood & affect appropriate.     Data Reviewed: I have personally reviewed following labs and imaging studies  CBC: Recent Labs  Lab 01/16/2017 1741 02/02/17 0329 02/02/17 1143 02/03/17 0302 02/04/17 0850  WBC 17.4* 20.1* 21.5* 21.8* 11.5*  NEUTROABS 15.0*  --   --   --   --   HGB 13.1 13.7 12.7* 11.9* 12.1*  HCT 39.4 40.4 38.1* 35.7* 35.7*  MCV 97.3 94.8 96.7 95.5 95.7  PLT 267 227 212 188 762   Basic Metabolic Panel: Recent Labs  Lab 02/02/2017 1741 02/02/17 0329 02/02/17 1143 02/03/17 0302 02/04/17 0850  NA 136 135 136 138 141  K 3.8 3.8 3.4* 3.3* 2.8*  CL 95* 97* 98* 102 105  CO2 26 24 26 26 23   GLUCOSE 179* 193* 174* 125* 139*  BUN 63* 63* 53* 48* 46*  CREATININE 2.54* 3.00* 2.14* 1.33* 0.92  CALCIUM 8.8* 8.5* 8.5* 8.6* 9.0   GFR: Estimated Creatinine Clearance: 54 mL/min  (by C-G formula based on SCr of 0.92 mg/dL). Liver Function Tests: Recent Labs  Lab 01/11/2017 1741  AST 23  ALT 12*  ALKPHOS 111  BILITOT 0.7  PROT 7.1  ALBUMIN 3.9   Recent Labs  Lab 01/28/2017 1741  LIPASE 25   No results for input(s): AMMONIA in the last 168 hours. Coagulation Profile: Recent Labs  Lab 01/23/2017 1741  INR 1.08   Cardiac Enzymes: Recent Labs  Lab 02/02/17 0058 02/02/17 0329 02/02/17 0952  TROPONINI <0.03 <0.03 <0.03   BNP (last 3 results) No results for input(s): PROBNP in the last 8760 hours. HbA1C: No results for input(s): HGBA1C in the last 72 hours. CBG: Recent Labs  Lab 02/03/17 0831 02/03/17 1214 02/03/17 1634 02/03/17 2111 02/04/17 0829  GLUCAP 113* 115* 127* 133* 118*   Lipid Profile: No results for input(s): CHOL, HDL, LDLCALC, TRIG, CHOLHDL, LDLDIRECT in the last 72 hours. Thyroid Function Tests: Recent Labs    01/08/2017 1757  TSH 3.917   Anemia Panel: No results for input(s): VITAMINB12, FOLATE,  FERRITIN, TIBC, IRON, RETICCTPCT in the last 72 hours. Sepsis Labs: Recent Labs  Lab 01/03/2017 1756 01/06/2017 2139 02/02/17 0058 02/02/17 0059 02/02/17 0329  PROCALCITON  --   --  1.98  --   --   LATICACIDVEN 3.45* 5.03*  --  4.0* 3.7*    Recent Results (from the past 240 hour(s))  Culture, blood (routine x 2)     Status: None (Preliminary result)   Collection Time: 02/02/2017  6:01 PM  Result Value Ref Range Status   Specimen Description   Final    BLOOD LEFT ANTECUBITAL Performed at [facility], [place] [address], [place], [place] [address]    Special Requests   Final    BOTTLES DRAWN AEROBIC AND ANAEROBIC Blood Culture adequate volume Performed at [facility] [address] [place], [place] [address]    Culture   Final    NO GROWTH 3 DAYS Performed at [facility], [place] [address], [place], [place] [address]    Report Status PENDING  Incomplete  Culture, blood (routine x 2)      Status: None (Preliminary result)   Collection Time: 01/05/2017  6:14 PM  Result Value Ref Range Status   Specimen Description   Final    BLOOD RIGHT WRIST Performed at [facility] [address], [place], [place] [address]    Special Requests   Final    BOTTLES DRAWN AEROBIC AND ANAEROBIC Blood Culture adequate volume Performed at [facility] [address] [place], [place] [address]    Culture   Final    NO GROWTH 3 DAYS Performed at [facility], [place] [address] [place], [place] [address]    Report Status PENDING  Incomplete  MRSA PCR Screening     Status: None   Collection Time: 02/02/17  2:53 AM  Result Value Ref Range Status   MRSA by PCR NEGATIVE NEGATIVE Final    Comment:        The GeneXpert MRSA Assay (FDA approved for NASAL specimens only), is one component of a comprehensive MRSA colonization surveillance program. It is not intended to diagnose MRSA infection nor to guide or monitor treatment for MRSA infections.   Culture, Urine     Status: Abnormal (Preliminary result)   Collection Time: 02/02/17  6:36 PM  Result Value Ref Range Status   Specimen Description   Final    URINE, RANDOM Performed at [place] [address], [place], [place] [address]    Special Requests   Final    NONE Performed at [facility], [place] [address] [place], [place] [address]    Culture (A)  Final    40,000 COLONIES/mL UNIDENTIFIED ORGANISM Performed at [facility], [place] [address], [place], [place] [address]    Report Status PENDING  Incomplete         Radiology Studies: Dg Chest 1 View  Result Date: 02/03/2017 CLINICAL DATA:  Hypoxemia.  Follow-up exam. EXAM: CHEST 1 VIEW COMPARISON:  02/02/2017 and older studies. FINDINGS: Cardiac silhouette top-normal in size. Left lung base opacity partly silhouettes the hemidiaphragm. Milder opacity at the medial right lung base. These  areas are likely due to atelectasis, possibly with small effusions. Mild hazy opacity is noted in the central right upper lobe similar to the prior exam. This may reflect atelectasis or pneumonia. Mild asymmetric interstitial edema is possible. No new lung abnormalities. No pneumothorax. IMPRESSION: 1.  No significant change from the previous day's study. 2. Left greater than right lung base opacities with mild central right upper lobe hazy lung opacity. Lung base opacity is most likely atelectasis, possible small effusions. Central right upper lung opacity may reflect atelectasis or be due to infection possibly asymmetric edema. Electronically Signed   By: Lajean Manes M.D.   On: 02/03/2017 08:53   Dg Chest 1 View  Result Date: 02/02/2017 CLINICAL DATA:  Follow-up right upper lobe opacity. EXAM: CHEST 1 VIEW COMPARISON:  02/02/2016.  03/09/2016.  03/03/2016. FINDINGS: Mediastinum hilar structures normal. Persistent atelectatic changes/infiltrate right upper lobe. Continued follow-up exam can be obtained to demonstrate clearing. Mild left base atelectasis/infiltrate on today's exam. Small left pleural effusion cannot be excluded. No pneumothorax. Stable cardiomegaly. Hiatal hernia. IMPRESSION: 1. Persistent atelectasis/infiltrate right upper lobe. Continued follow-up chest x-rays can be obtained to demonstrate clearing. 2. New left base subsegmental atelectasis/infiltrate. Pleural effusion cannot be excluded. 3.  Stable cardiomegaly. 4.  Hiatal hernia again noted. Electronically Signed   By: Marcello Moores  Register   On: 02/02/2017 14:57   US Renal  Result Date: 02/02/2017 CLINICAL DATA:  Acute renal failure. EXAM: RENAL / URINARY TRACT ULTRASOUND COMPLETE COMPARISON:  None FINDINGS: Right Kidney: Length: 10.1 cm. Stone within the mid right kidney measures 7.3 mm. There are 3 cysts noted in the right kidney. The largest is in the inferior pole measuring 2 x 1.6 x 1.8 cm. No mass or hydronephrosis. Left Kidney:  Length: 10.6 cm. Echogenicity within normal limits. No mass or hydronephrosis visualized. Bladder: Appears normal for degree of bladder distention. IMPRESSION: 1. No hydronephrosis. 2. Left kidney cysts. 3. Left kidney stone. Electronically Signed   By: Kerby Moors M.D.   On: 02/02/2017 15:08        Scheduled Meds: . bicalutamide  50 mg Oral Daily  . chlorhexidine  15 mL Mouth Rinse BID  . diltiazem  30 mg Oral Q6H  . ferrous sulfate  325 mg Oral BID WC  . insulin aspart  0-9 Units Subcutaneous TID WC  . mouth rinse  15 mL Mouth Rinse q12n4p  . metoprolol succinate  12.5 mg Oral Daily  . pantoprazole  40 mg Oral Daily  . polyvinyl alcohol  1 drop Both Eyes Daily  . pravastatin  10 mg Oral QHS   Continuous Infusions: . ceFEPime (MAXIPIME) IV Stopped (02/03/17 2143)     LOS: 3 days      Georgette Shell, MD [facility] Pager [phone]  If 7PM-7AM, please contact night-coverage [email or web address] Password [facility] 02/04/2017, 10:56 AM

## 2017-02-04 NOTE — Progress Notes (Signed)
CSW received a call from [facility] requesting D/C status.  CSW spoke to RN who stated D/C is unlikely today.  CSW update [facility].  Please reconsult if future social work needs arise.  CSW signing off, as social work intervention is no longer needed.  Alphonse Guild. Karys Meckley, LCSW, LCAS, CSI Clinical Social Worker Ph: [phone]

## 2017-02-05 DIAGNOSIS — J690 Pneumonitis due to inhalation of food and vomit: Secondary | ICD-10-CM

## 2017-02-05 DIAGNOSIS — J9601 Acute respiratory failure with hypoxia: Secondary | ICD-10-CM

## 2017-02-05 LAB — GLUCOSE, CAPILLARY: GLUCOSE-CAPILLARY: 133 mg/dL — AB (ref 65–99)

## 2017-02-05 LAB — CBC
HCT: 36.8 % — ABNORMAL LOW (ref 39.0–52.0)
HEMOGLOBIN: 12 g/dL — AB (ref 13.0–17.0)
MCH: 31.6 pg (ref 26.0–34.0)
MCHC: 32.6 g/dL (ref 30.0–36.0)
MCV: 96.8 fL (ref 78.0–100.0)
Platelets: 223 10*3/uL (ref 150–400)
RBC: 3.8 MIL/uL — ABNORMAL LOW (ref 4.22–5.81)
RDW: 14.3 % (ref 11.5–15.5)
WBC: 8.5 10*3/uL (ref 4.0–10.5)

## 2017-02-05 LAB — BASIC METABOLIC PANEL
Anion gap: 11 (ref 5–15)
BUN: 54 mg/dL — ABNORMAL HIGH (ref 6–20)
CALCIUM: 8.6 mg/dL — AB (ref 8.9–10.3)
CO2: 21 mmol/L — ABNORMAL LOW (ref 22–32)
CREATININE: 1.12 mg/dL (ref 0.61–1.24)
Chloride: 109 mmol/L (ref 101–111)
GFR calc non Af Amer: 55 mL/min — ABNORMAL LOW (ref >60)
Glucose, Bld: 140 mg/dL — ABNORMAL HIGH (ref 65–99)
Potassium: 4.1 mmol/L (ref 3.5–5.1)
SODIUM: 141 mmol/L (ref 135–145)

## 2017-02-05 LAB — URINE CULTURE: Culture: 40000 — AB

## 2017-02-05 MED ORDER — GLYCOPYRROLATE 0.2 MG/ML IJ SOLN
0.2000 mg | INTRAMUSCULAR | Status: DC
  Administered 2017-02-05 – 2017-02-06 (×3): 0.2 mg via INTRAVENOUS
  Filled 2017-02-05 (×3): qty 1

## 2017-02-05 MED ORDER — LORAZEPAM BOLUS VIA INFUSION: 1.0000 mg | INTRAVENOUS | Status: DC | PRN

## 2017-02-05 MED ORDER — LORAZEPAM 2 MG/ML IJ SOLN
1.0000 mg | INTRAMUSCULAR | Status: DC | PRN
  Administered 2017-02-05 – 2017-02-06 (×5): 1 mg via INTRAVENOUS
  Filled 2017-02-05 (×5): qty 1

## 2017-02-05 MED ORDER — MORPHINE SULFATE (PF) 4 MG/ML IV SOLN
2.0000 mg | INTRAVENOUS | Status: DC | PRN
  Administered 2017-02-05 – 2017-02-06 (×7): 2 mg via INTRAVENOUS
  Filled 2017-02-05 (×6): qty 1

## 2017-02-05 MED ORDER — MORPHINE SULFATE (PF) 4 MG/ML IV SOLN
1.0000 mg | INTRAVENOUS | Status: DC | PRN
  Administered 2017-02-05: 1 mg via INTRAVENOUS
  Filled 2017-02-05: qty 1

## 2017-02-05 MED ORDER — MORPHINE SULFATE (PF) 4 MG/ML IV SOLN
0.5000 mg | INTRAVENOUS | Status: DC | PRN
  Administered 2017-02-05: 0.52 mg via INTRAVENOUS
  Filled 2017-02-05: qty 1

## 2017-02-05 MED ORDER — MORPHINE SULFATE (PF) 4 MG/ML IV SOLN
2.0000 mg | INTRAVENOUS | Status: DC | PRN
  Filled 2017-02-05: qty 1

## 2017-02-05 NOTE — Progress Notes (Signed)
PROGRESS NOTE    Parker [name]  XFG:[id number] DOB: December 30, 1923 DOA: 01/14/2017 PCP: Seward Carol, MD Brief Narrative: 84 yo malewithhistory of atrial fibrillation, diabetes mellitus type 2, GI bleed, aortic stenosis and prostate cancer was brought to the ER after patient was found to be increasingly confused since morning. As per the patient's son patient also was mildly hypoxic or did not have any nausea vomiting diarrhea chest pain or shortness of breath. No change in medications recently.  ED Course:In the ER patient initially was found to be in A. fib with RVR which improved with Cardizem bolus. Lactate was elevated at around 3.4 which further worsened at 5 despite fluids. WBC count was 17,000 with UA showing features consistent with UTI and chest x-ray showing possible right sided pneumonia. Patient was started on empiric antibiotics after cultures obtained and admitted for developing sepsis from UTI and possible pneumonia. As per the patient's and patient is mostly bedbound and uses a wheelchair to move around. CT head done due to acute encephalopathy was unremarkable. 02/02/2017 patient awake.Sent by the bedside. He feels his breathing is better but still short of breath and dyspneic on exertion.  02/03/2017 discussed in detail with patient's son who is patient's POA. Will get hosp[facility] of [place] involved. Patient has been having A. fib RVR overnight.He has received some extra doses of Lopressor IV to control the heart rate.  02/05/2017 earlier when I saw the patient he was getting cleaning done by the nursing team.  Patient has just had a BM.  Then I was called by the nurse as patient started vomiting and aspirated and became very hypoxic and was placed on 100% nonrebreather mask.  His son was present in the room.  Upon my arrival patient was gasping and having agonal respiration.  Patient was placed on morphine for comfort and oxygen for comfort.  Discussed in detail with  patient's son who is in agreement with the plan.  Patient was due to be discharged to the nursing home with hospice following.    Assessment & Plan:   Principal Problem:   SIRS (systemic inflammatory response syndrome) (HCC) Active Problems:   Diabetes type 2, controlled (HCC)   Aortic stenosis   Atrial fibrillation (HCC)   Acute encephalopathy   Pneumonia  1] acute hypoxic respiratory failure secondary to aspiration pneumonia-patient is a comfort care patient is DO NOT RESUSCITATE.  I will stop all his p.o. medications and put him on morphine for comfort along with Ativan.   DVT prophylaxis: Not applicable Code Status: Hospice care Family Communication: Discussed with son Brad Disposition Plan: TBD Consultants: Cardiology  Procedures none Antimicrobials: Maxipime Subjective: Patient unresponsive gasping for breath on 100% nonrebreather mask   Objective: Vitals:   02/05/17 0700 02/05/17 0744 02/05/17 0937 02/05/17 0953  BP:   (!) 133/92 (!) 133/92  Pulse:    (!) 106  Resp:  (!) 24 (!) 22   Temp: 97.6 F (36.4 C) (!) 97.4 F (36.3 C)    TempSrc: Axillary Oral    SpO2:   93%   Weight:      Height:        Intake/Output Summary (Last 24 hours) at 02/05/2017 1518 Last data filed at 02/05/2017 0600 Gross per 24 hour  Intake 430 ml  Output 825 ml  Net -395 ml   Filed Weights   02/02/17 0303 02/03/17 0408 02/04/17 0425  Weight: 81.9 kg (180 lb 8.9 oz) 83.8 kg (184 lb 11.9 oz) 83.3 kg (183 lb 10.3  oz)    Examination:  General exam: Appears calm and comfortable  Respiratory system:RHONCHIRespiratory effort normal. Cardiovascular system: S1 & S2 heard, RRR. No JVD, murmurs, rubs, gallops or clicks. No pedal edema. Gastrointestinal system: Abdomen is nondistended, soft and nontender. No organomegaly or masses felt. Normal bowel sounds heard. Central nervous system: Alert and oriented. No focal neurological deficits. Extremities: Symmetric 5 x 5 power. Skin: No rashes,  lesions or ulcers Psychiatry: Judgement and insight appear normal. Mood & affect appropriate.     Data Reviewed: I have personally reviewed following labs and imaging studies  CBC: Recent Labs  Lab 01/07/2017 1741 02/02/17 0329 02/02/17 1143 02/03/17 0302 02/04/17 0850 02/05/17 0339  WBC 17.4* 20.1* 21.5* 21.8* 11.5* 8.5  NEUTROABS 15.0*  --   --   --   --   --   HGB 13.1 13.7 12.7* 11.9* 12.1* 12.0*  HCT 39.4 40.4 38.1* 35.7* 35.7* 36.8*  MCV 97.3 94.8 96.7 95.5 95.7 96.8  PLT 267 227 212 188 212 973   Basic Metabolic Panel: Recent Labs  Lab 02/02/17 0329 02/02/17 1143 02/03/17 0302 02/04/17 0850 02/05/17 0339  NA 135 136 138 141 141  K 3.8 3.4* 3.3* 2.8* 4.1  CL 97* 98* 102 105 109  CO2 24 26 26 23  21*  GLUCOSE 193* 174* 125* 139* 140*  BUN 63* 53* 48* 46* 54*  CREATININE 3.00* 2.14* 1.33* 0.92 1.12  CALCIUM 8.5* 8.5* 8.6* 9.0 8.6*   GFR: Estimated Creatinine Clearance: 44.4 mL/min (by C-G formula based on SCr of 1.12 mg/dL). Liver Function Tests: Recent Labs  Lab 01/16/2017 1741  AST 23  ALT 12*  ALKPHOS 111  BILITOT 0.7  PROT 7.1  ALBUMIN 3.9   Recent Labs  Lab 01/19/2017 1741  LIPASE 25   No results for input(s): AMMONIA in the last 168 hours. Coagulation Profile: Recent Labs  Lab 01/27/2017 1741  INR 1.08   Cardiac Enzymes: Recent Labs  Lab 02/02/17 0058 02/02/17 0329 02/02/17 0952  TROPONINI <0.03 <0.03 <0.03   BNP (last 3 results) No results for input(s): PROBNP in the last 8760 hours. HbA1C: No results for input(s): HGBA1C in the last 72 hours. CBG: Recent Labs  Lab 02/04/17 1210 02/04/17 1640 02/04/17 2150 02/04/17 2319 02/05/17 0742  GLUCAP 140* 127* 135* 126* 133*   Lipid Profile: No results for input(s): CHOL, HDL, LDLCALC, TRIG, CHOLHDL, LDLDIRECT in the last 72 hours. Thyroid Function Tests: No results for input(s): TSH, T4TOTAL, FREET4, T3FREE, THYROIDAB in the last 72 hours. Anemia Panel: No results for input(s):  VITAMINB12, FOLATE, FERRITIN, TIBC, IRON, RETICCTPCT in the last 72 hours. Sepsis Labs: Recent Labs  Lab 01/13/2017 1756 01/11/2017 2139 02/02/17 0058 02/02/17 0059 02/02/17 0329  PROCALCITON  --   --  1.98  --   --   LATICACIDVEN 3.45* 5.03*  --  4.0* 3.7*    Recent Results (from the past 240 hour(s))  Culture, blood (routine x 2)     Status: None (Preliminary result)   Collection Time: 01/27/2017  6:01 PM  Result Value Ref Range Status   Specimen Description   Final    BLOOD LEFT ANTECUBITAL Performed at [facility], [place] [address], [place], [place] [address]    Special Requests   Final    BOTTLES DRAWN AEROBIC AND ANAEROBIC Blood Culture adequate volume Performed at [place] [address] [place], [place] [address]    Culture   Final    NO GROWTH 3  DAYS Performed at [facility], [place] [address] [place], [place] [address]    Report Status PENDING  Incomplete  Culture, blood (routine x 2)     Status: None (Preliminary result)   Collection Time: 01/12/2017  6:14 PM  Result Value Ref Range Status   Specimen Description   Final    BLOOD RIGHT WRIST Performed at [facility] [address] [place], [place] [address]    Special Requests   Final    BOTTLES DRAWN AEROBIC AND ANAEROBIC Blood Culture adequate volume Performed at [facility] [address] [place], [place] [address]    Culture   Final    NO GROWTH 3 DAYS Performed at [facility], [place] [address] [place], [place] [address]    Report Status PENDING  Incomplete  MRSA PCR Screening     Status: None   Collection Time: 02/02/17  2:53 AM  Result Value Ref Range Status   MRSA by PCR NEGATIVE NEGATIVE Final    Comment:        The GeneXpert MRSA Assay (FDA approved for NASAL specimens only), is one component of a comprehensive MRSA colonization surveillance program. It is not intended to diagnose MRSA infection nor to  guide or monitor treatment for MRSA infections.   Culture, Urine     Status: Abnormal   Collection Time: 02/02/17  6:36 PM  Result Value Ref Range Status   Specimen Description   Final    URINE, RANDOM Performed at [place] [address], [place],  [address]    Special Requests   Final    NONE Performed at [facility], [place] [address] [place], [place] [address]    Culture 40,000 COLONIES/mL ENTEROCOCCUS FAECALIS (A)  Final   Report Status 02/05/2017 FINAL  Final   Organism ID, Bacteria ENTEROCOCCUS FAECALIS (A)  Final      Susceptibility   Enterococcus faecalis - MIC*    AMPICILLIN <=2 SENSITIVE Sensitive     LEVOFLOXACIN >=8 RESISTANT Resistant     NITROFURANTOIN <=16 SENSITIVE Sensitive     VANCOMYCIN 1 SENSITIVE Sensitive     * 40,000 COLONIES/mL ENTEROCOCCUS FAECALIS         Radiology Studies: No results found.      Scheduled Meds: . chlorhexidine  15 mL Mouth Rinse BID  . diltiazem  30 mg Oral Q6H  . insulin aspart  0-9 Units Subcutaneous TID WC  . mouth rinse  15 mL Mouth Rinse q12n4p   Continuous Infusions: . ceFEPime (MAXIPIME) IV Stopped (02/05/17 0554)     LOS: 4 days       Georgette Shell, MD [facility] If 7PM-7AM, please contact night-coverage [email or web address] Password [facility] 02/05/2017, 3:18 PM

## 2017-02-05 NOTE — Progress Notes (Signed)
Patient's family appreciative of care, would like pt to receive Morphine and Ativan when available. Family going home to rest. Asked to be called if I feel death is near.  Explained I will be glad to do that but sometimes it just happens and I would not be able to notify them in time for them to get back to hospital. Family understands and states they have been here all day and do not feel like he knows they are here at this point. [facility] in [place] is the name they gave me if pt were to pass before they return. Will continue with comfort care and medicate as needed. Hoyle Barr, RN-C

## 2017-02-05 NOTE — Progress Notes (Signed)
Called to room to speak with [name] (son). Patient appeared congested unable to cough up secretions suctioned offered. Oxygen level steadily decreasing from 92 to 70%.  Patient vomited large amount of brown emesis with food . Suction provided and NRB applied at 100% Dr. Zigmund Daniel notified and arrived on unit . Emotional support provided to family.

## 2017-02-06 LAB — CULTURE, BLOOD (ROUTINE X 2)
CULTURE: NO GROWTH
Culture: NO GROWTH
SPECIAL REQUESTS: ADEQUATE
SPECIAL REQUESTS: ADEQUATE

## 2017-03-03 NOTE — Progress Notes (Signed)
Name: Parker [name], [place]  Location: [address] [facility] of request: End of Life  Family found meaning and peace of mind spending time with Pt. and saying their goodbyes. When chaplain arrived 11:25am Pt. was actively passing; it is now 6:30pm and Pt has not passed. This isn't the most ideal situation; however this is giving family more opportunity to spend time with Pt. in a way that is meaningful for their grieving process. Chaplain prayed with family and continues to check in.  [phone].  Thanks,  Erick Blinks

## 2017-03-03 NOTE — Discharge Summary (Signed)
Death Summary  Riyansh Gerstner QIO:[id number] DOB: 1923-10-10 DOA: 02/19/2017  PCP: Seward Carol, MD  Admit date: 02/05/2017 Date of Death: 02-11-2017 Time of Death: 5;30 am Notification: Seward Carol, MD notified of death of Feb 11, 2017   History of present illness:  Biruk Troia is a 84 y.o. male with a history of AFIB,DM,AORTIC STENOSIS,PROSTATE CANCER ,UTI [name] presented with complaint of SHORTNESS OF BREATH AND COUGH. Tyrice Anand did not improve after IV ANTIBIOTICS  Final Diagnoses:  1.  RESPIRATORY FAILURE/ASPIRATION PNEUMONIA   The results of significant diagnostics from this hospitalization (including imaging, microbiology, ancillary and laboratory) are listed below for reference.    Significant Diagnostic Studies: Dg Chest 1 View  Result Date: 02/03/2017 CLINICAL DATA:  Hypoxemia.  Follow-up exam. EXAM: CHEST 1 VIEW COMPARISON:  02/02/2017 and older studies. FINDINGS: Cardiac silhouette top-normal in size. Left lung base opacity partly silhouettes the hemidiaphragm. Milder opacity at the medial right lung base. These areas are likely due to atelectasis, possibly with small effusions. Mild hazy opacity is noted in the central right upper lobe similar to the prior exam. This may reflect atelectasis or pneumonia. Mild asymmetric interstitial edema is possible. No new lung abnormalities. No pneumothorax. IMPRESSION: 1. No significant change from the previous day's study. 2. Left greater than right lung base opacities with mild central right upper lobe hazy lung opacity. Lung base opacity is most likely atelectasis, possible small effusions. Central right upper lung opacity may reflect atelectasis or be due to infection possibly asymmetric edema. Electronically Signed   By: Lajean Manes M.D.   On: 02/03/2017 08:53   Dg Chest 1 View  Result Date: 02/02/2017 CLINICAL DATA:  Follow-up right upper lobe opacity. EXAM: CHEST 1 VIEW COMPARISON:  2016/02/07.  03/09/2016.  03/03/2016. FINDINGS:  Mediastinum hilar structures normal. Persistent atelectatic changes/infiltrate right upper lobe. Continued follow-up exam can be obtained to demonstrate clearing. Mild left base atelectasis/infiltrate on today's exam. Small left pleural effusion cannot be excluded. No pneumothorax. Stable cardiomegaly. Hiatal hernia. IMPRESSION: 1. Persistent atelectasis/infiltrate right upper lobe. Continued follow-up chest x-rays can be obtained to demonstrate clearing. 2. New left base subsegmental atelectasis/infiltrate. Pleural effusion cannot be excluded. 3.  Stable cardiomegaly. 4.  Hiatal hernia again noted. Electronically Signed   By: Marcello Moores  Register   On: 02/02/2017 14:57   Ct Head Wo Contrast  Result Date: 02/13/2017 CLINICAL DATA:  Altered level of consciousness.  03/04/2016 EXAM: CT HEAD WITHOUT CONTRAST TECHNIQUE: Contiguous axial images were obtained from the base of the skull through the vertex without intravenous contrast. COMPARISON:  03/04/2016 FINDINGS: Brain: There is mild diffuse low-attenuation within the subcortical and periventricular white matter compatible with chronic microvascular disease. There is prominence of the sulci and ventricles compatible with brain atrophy. No evidence of acute infarction, intracranial hemorrhage or mass. No abnormal extra-axial fluid collections identified. No mass effect or midline shift identified. Vascular: No hyperdense vessel or unexpected calcification. Skull: The paranasal sinuses are clear. The mastoid air cells are also clear. The calvarium appears intact. Sinuses/Orbits: No acute finding. Other: None. IMPRESSION: 1. No acute intracranial abnormalities. 2. Chronic small vessel ischemic change and brain atrophy. Electronically Signed   By: Kerby Moors M.D.   On: February 06, 2017 18:46   US Renal  Result Date: 02/02/2017 CLINICAL DATA:  Acute renal failure. EXAM: RENAL / URINARY TRACT ULTRASOUND COMPLETE COMPARISON:  None FINDINGS: Right Kidney: Length: 10.1 cm.  Stone within the mid right kidney measures 7.3 mm. There are 3 cysts noted in the right kidney.  The largest is in the inferior pole measuring 2 x 1.6 x 1.8 cm. No mass or hydronephrosis. Left Kidney: Length: 10.6 cm. Echogenicity within normal limits. No mass or hydronephrosis visualized. Bladder: Appears normal for degree of bladder distention. IMPRESSION: 1. No hydronephrosis. 2. Left kidney cysts. 3. Left kidney stone. Electronically Signed   By: Kerby Moors M.D.   On: 02/02/2017 15:08   Dg Chest Port 1 View  Result Date: 01/11/2017 CLINICAL DATA:  Shortness of Breath EXAM: PORTABLE CHEST 1 VIEW COMPARISON:  03/09/2016 FINDINGS: Large hiatal hernia. Mild cardiomegaly. Turn for airspace opacity in the medial right upper lobe. No effusions or acute bony abnormality. IMPRESSION: Cardiomegaly. Large hiatal hernia. Concern for right upper lobe opacity/pneumonia. Electronically Signed   By: Rolm Baptise M.D.   On: 01/31/2017 17:32    Microbiology: Recent Results (from the past 240 hour(s))  Culture, blood (routine x 2)     Status: None (Preliminary result)   Collection Time: 01/15/2017  6:01 PM  Result Value Ref Range Status   Specimen Description   Final    BLOOD LEFT ANTECUBITAL Performed at [facility] [address], [place], [place] [address]    Special Requests   Final    BOTTLES DRAWN AEROBIC AND ANAEROBIC Blood Culture adequate volume Performed at [facility] [address], [place], [place] [address]    Culture   Final    NO GROWTH 4 DAYS Performed at [facility], [place] [address], [place], [place] [address]    Report Status PENDING  Incomplete  Culture, blood (routine x 2)     Status: None (Preliminary result)   Collection Time: 01/11/2017  6:14 PM  Result Value Ref Range Status   Specimen Description   Final    BLOOD RIGHT WRIST Performed at [facility] [address] [place], [place] [address]    Special  Requests   Final    BOTTLES DRAWN AEROBIC AND ANAEROBIC Blood Culture adequate volume Performed at [facility] [address] [place], [place] [address]    Culture   Final    NO GROWTH 4 DAYS Performed at [facility], [place] [address], [place], [place] [address]    Report Status PENDING  Incomplete  MRSA PCR Screening     Status: None   Collection Time: 02/02/17  2:53 AM  Result Value Ref Range Status   MRSA by PCR NEGATIVE NEGATIVE Final    Comment:        The GeneXpert MRSA Assay (FDA approved for NASAL specimens only), is one component of a comprehensive MRSA colonization surveillance program. It is not intended to diagnose MRSA infection nor to guide or monitor treatment for MRSA infections.   Culture, Urine     Status: Abnormal   Collection Time: 02/02/17  6:36 PM  Result Value Ref Range Status   Specimen Description   Final    URINE, RANDOM Performed at [facility] [address] [place], [place] [address]    Special Requests   Final    NONE Performed at [facility], [place] [address] [place], [place] [address]    Culture 40,000 COLONIES/mL ENTEROCOCCUS FAECALIS (A)  Final   Report Status 02/05/2017 FINAL  Final   Organism ID, Bacteria ENTEROCOCCUS FAECALIS (A)  Final      Susceptibility   Enterococcus faecalis - MIC*    AMPICILLIN <=2 SENSITIVE Sensitive  LEVOFLOXACIN >=8 RESISTANT Resistant     NITROFURANTOIN <=16 SENSITIVE Sensitive     VANCOMYCIN 1 SENSITIVE Sensitive     * 40,000 COLONIES/mL ENTEROCOCCUS FAECALIS     Labs: Basic Metabolic Panel: Recent Labs  Lab 02/02/17 0329 02/02/17 1143 02/03/17 0302 02/04/17 0850 02/05/17 0339  NA 135 136 138 141 141  K 3.8 3.4* 3.3* 2.8* 4.1  CL 97* 98* 102 105 109  CO2 24 26 26 23  21*  GLUCOSE 193* 174* 125* 139* 140*  BUN 63* 53* 48* 46* 54*  CREATININE 3.00* 2.14* 1.33* 0.92 1.12  CALCIUM 8.5* 8.5* 8.6* 9.0 8.6*   Liver Function  Tests: Recent Labs  Lab 01/06/2017 1741  AST 23  ALT 12*  ALKPHOS 111  BILITOT 0.7  PROT 7.1  ALBUMIN 3.9   Recent Labs  Lab 01/04/2017 1741  LIPASE 25   No results for input(s): AMMONIA in the last 168 hours. CBC: Recent Labs  Lab 01/28/2017 1741 02/02/17 0329 02/02/17 1143 02/03/17 0302 02/04/17 0850 02/05/17 0339  WBC 17.4* 20.1* 21.5* 21.8* 11.5* 8.5  NEUTROABS 15.0*  --   --   --   --   --   HGB 13.1 13.7 12.7* 11.9* 12.1* 12.0*  HCT 39.4 40.4 38.1* 35.7* 35.7* 36.8*  MCV 97.3 94.8 96.7 95.5 95.7 96.8  PLT 267 227 212 188 212 223   Cardiac Enzymes: Recent Labs  Lab 02/02/17 0058 02/02/17 0329 02/02/17 0952  TROPONINI <0.03 <0.03 <0.03   D-Dimer No results for input(s): DDIMER in the last 72 hours. BNP: Invalid input(s): POCBNP CBG: Recent Labs  Lab 02/04/17 1210 02/04/17 1640 02/04/17 2150 02/04/17 2319 02/05/17 0742  GLUCAP 140* 127* 135* 126* 133*   Anemia work up No results for input(s): VITAMINB12, FOLATE, FERRITIN, TIBC, IRON, RETICCTPCT in the last 72 hours. Urinalysis    Component Value Date/Time   COLORURINE YELLOW 02/02/2017 0045   APPEARANCEUR CLOUDY (A) 02/02/2017 0045   LABSPEC 1.010 02/02/2017 0045   PHURINE 5.0 02/02/2017 0045   GLUCOSEU NEGATIVE 02/02/2017 0045   HGBUR MODERATE (A) 02/02/2017 0045   BILIRUBINUR NEGATIVE 02/02/2017 0045   KETONESUR NEGATIVE 02/02/2017 0045   PROTEINUR NEGATIVE 02/02/2017 0045   UROBILINOGEN 0.2 07/28/2014 0117   NITRITE NEGATIVE 02/02/2017 0045   LEUKOCYTESUR LARGE (A) 02/02/2017 0045   Sepsis Labs Invalid input(s): PROCALCITONIN,  WBC,  LACTICIDVEN     SIGNED:  Georgette Shell, MD  [facility] 03-05-17, 1:17 PM  If 7PM-7AM, please contact night-coverage [email or web address] Password [email or web address]

## 2017-03-03 DEATH — deceased

## 2018-08-12 IMAGING — CR DG CHEST 2V
2 series · 2 of 2 positions shown · non-contrast
Comparison: None.

CLINICAL DATA: Shortness of breath and altered mental status.

EXAM:
CHEST  2 VIEW

[w chest lat]
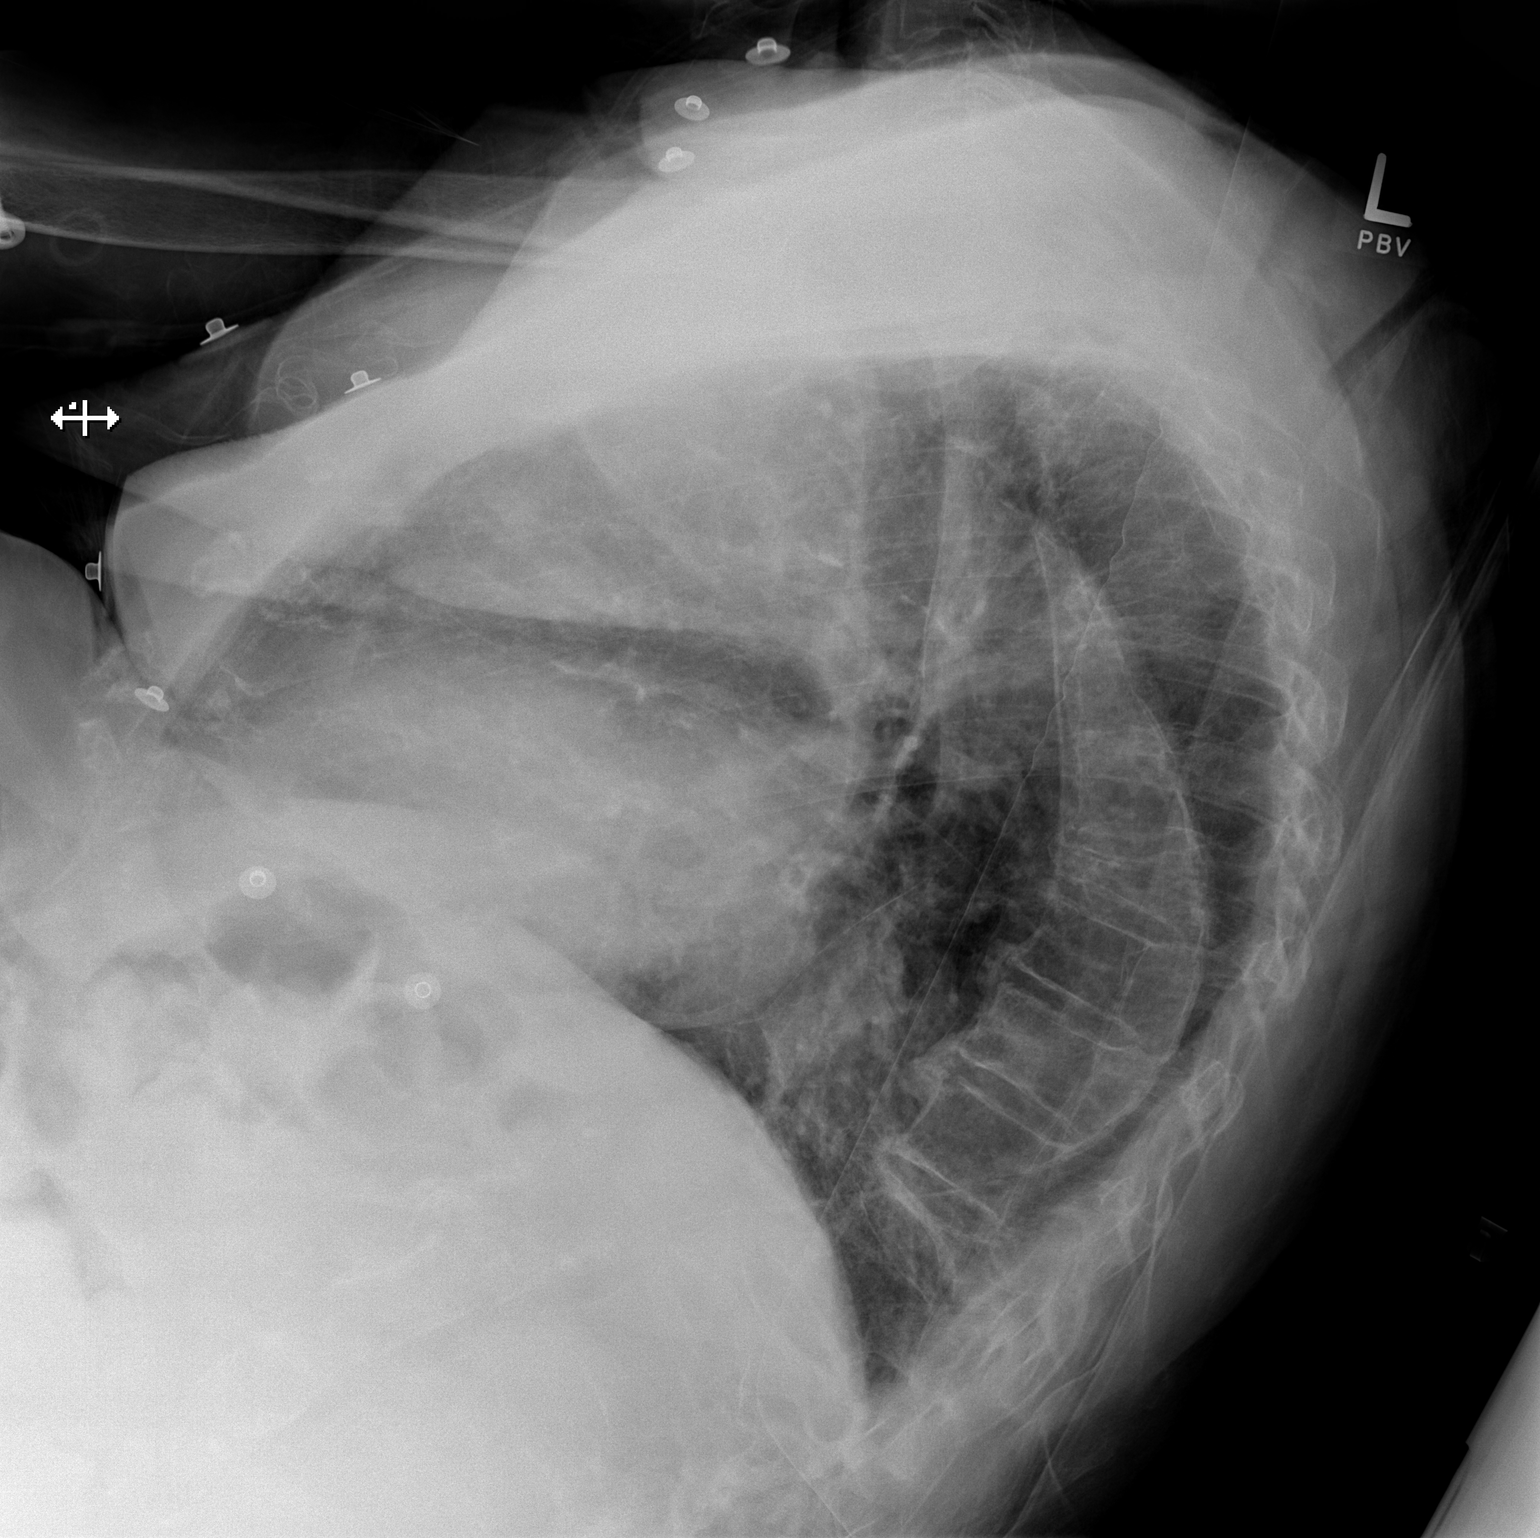

[x chest ap]
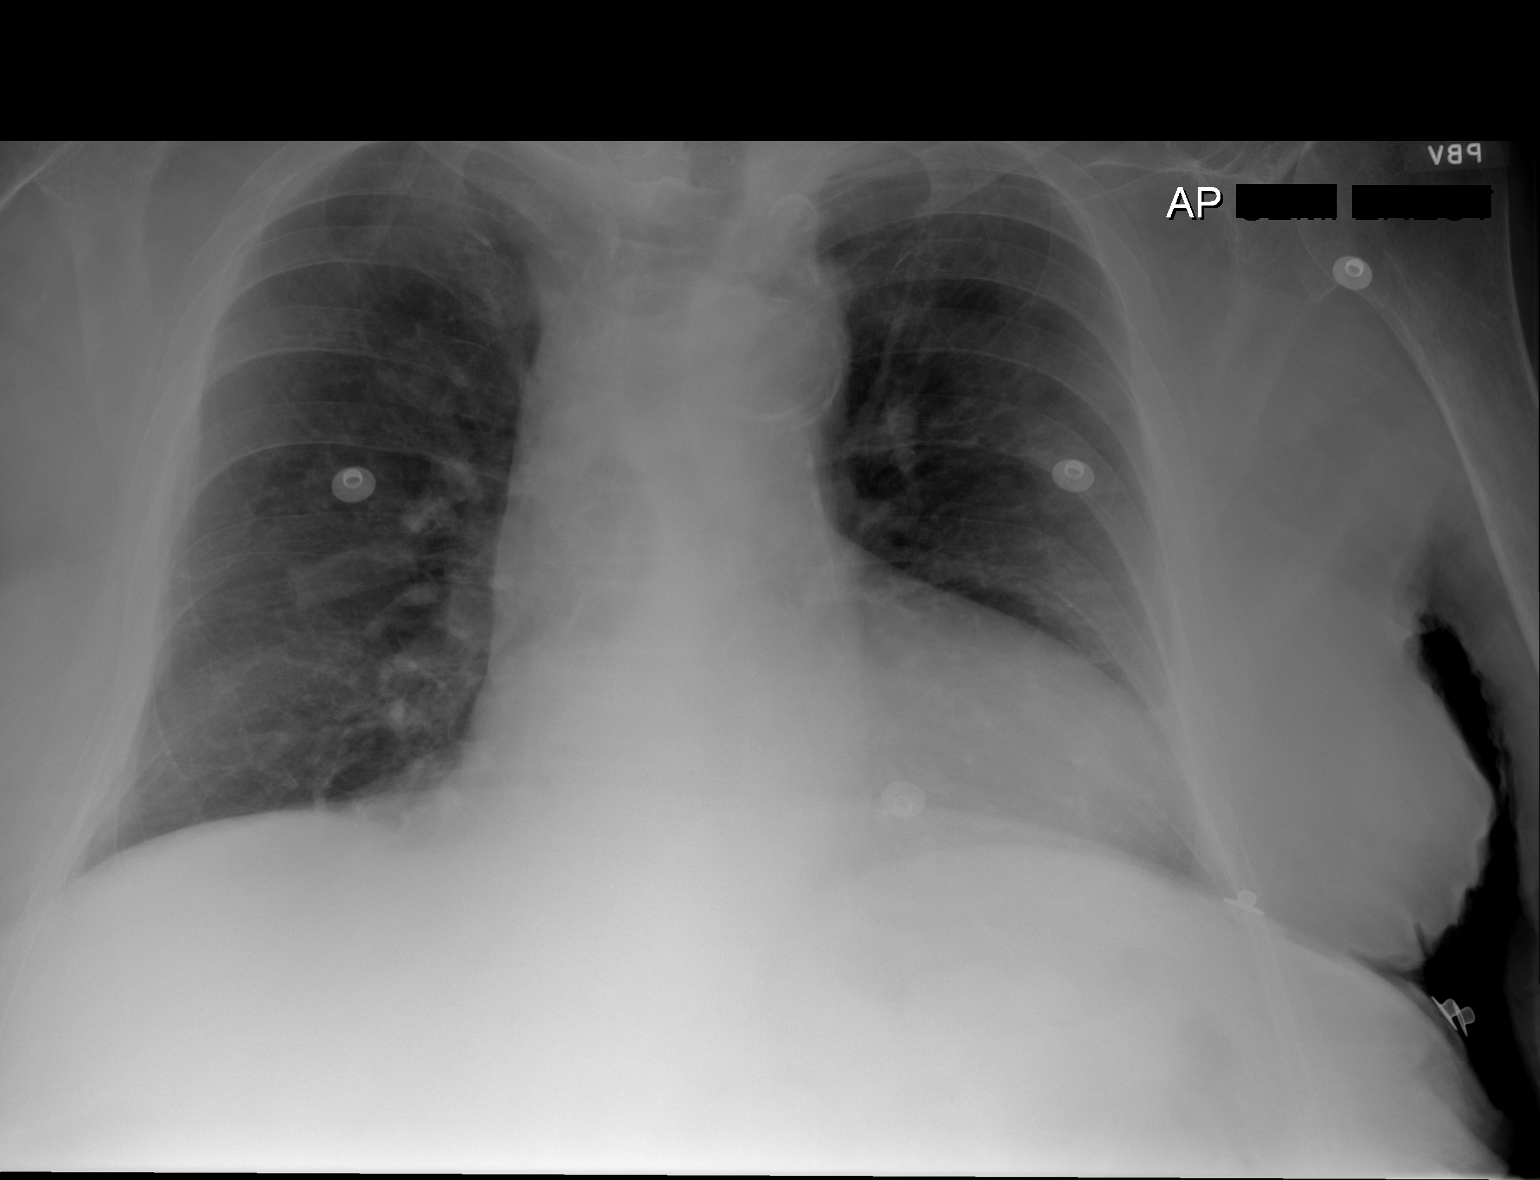

[2 of 2 positions shown; findings below may reference images not displayed]

FINDINGS: The heart is enlarged with aortic tortuosity and atherosclerosis.
Lungs appear hyperinflated. Ill-defined opacity posteriorly on the
lateral view may localize to the medial right lower lobe. No
pulmonary edema or pleural fluid. No pneumothorax. Compression
fractures in the mid and lower thoracic spine, lower compression
fracture is chronic based on lumbar spine radiographs 03/11/2015.
The bones are under mineralized.
IMPRESSION: 1. Ill-defined retrocardiac opacity likely in the medial right lower
lobe may be atelectasis or pneumonia.
2. Cardiomegaly with aortic tortuosity and atherosclerosis.
3. Compression fractures in the thoracic spine, at least 1 of which
is chronic.

## 2018-08-13 IMAGING — CR DG ABDOMEN 1V
3 series · 3 of 3 positions shown · non-contrast
Comparison: Lumbar spine radiographs performed 03/11/2015

CLINICAL DATA: Acute onset of constipation.  Initial encounter.

EXAM:
ABDOMEN - 1 VIEW

[x abdomen supine (1 of 3)]
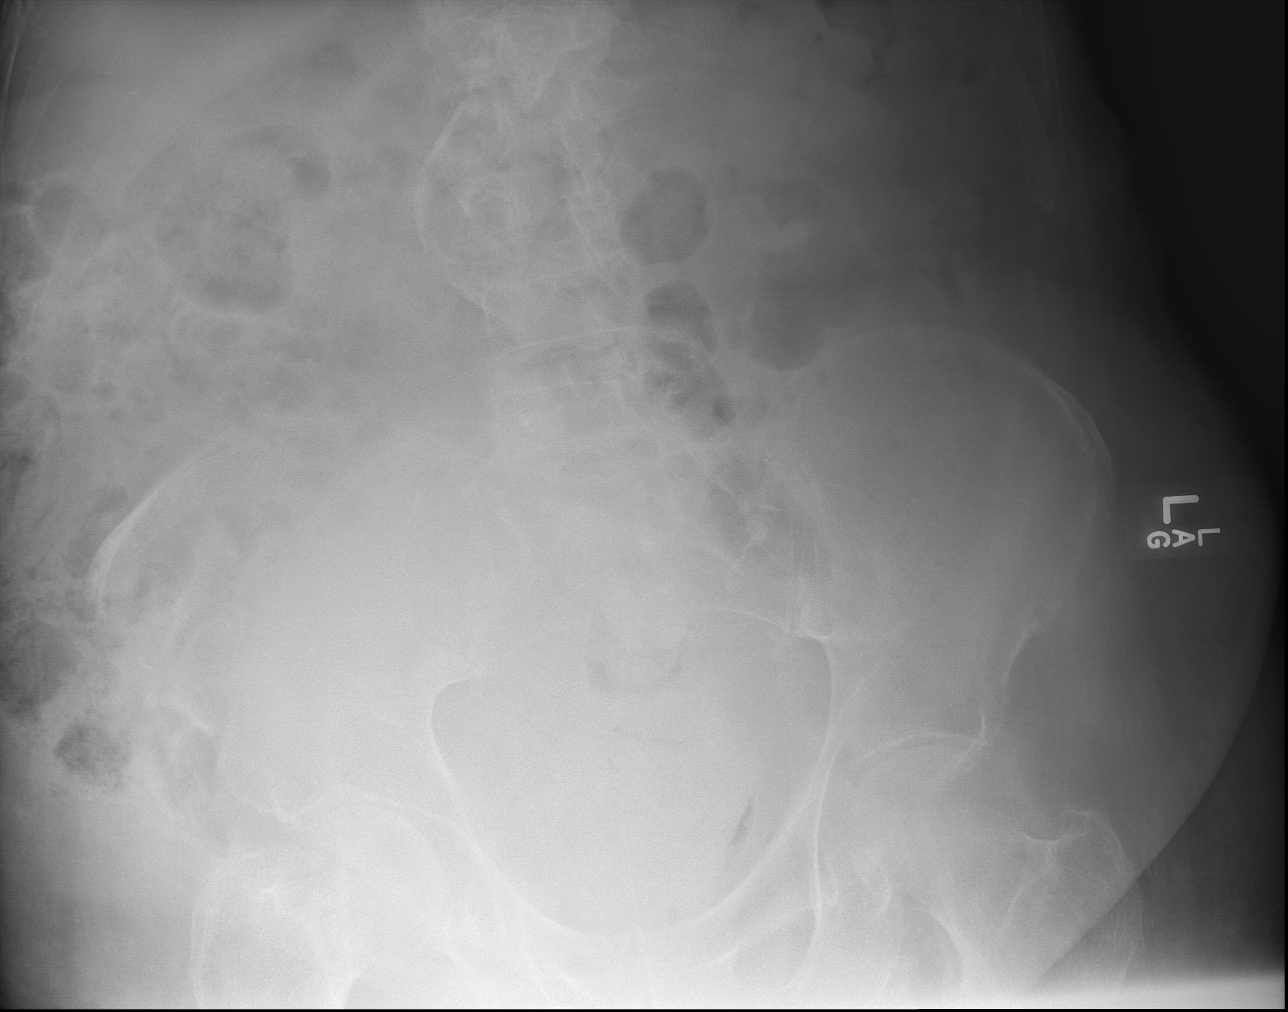

[x abdomen supine (2 of 3)]
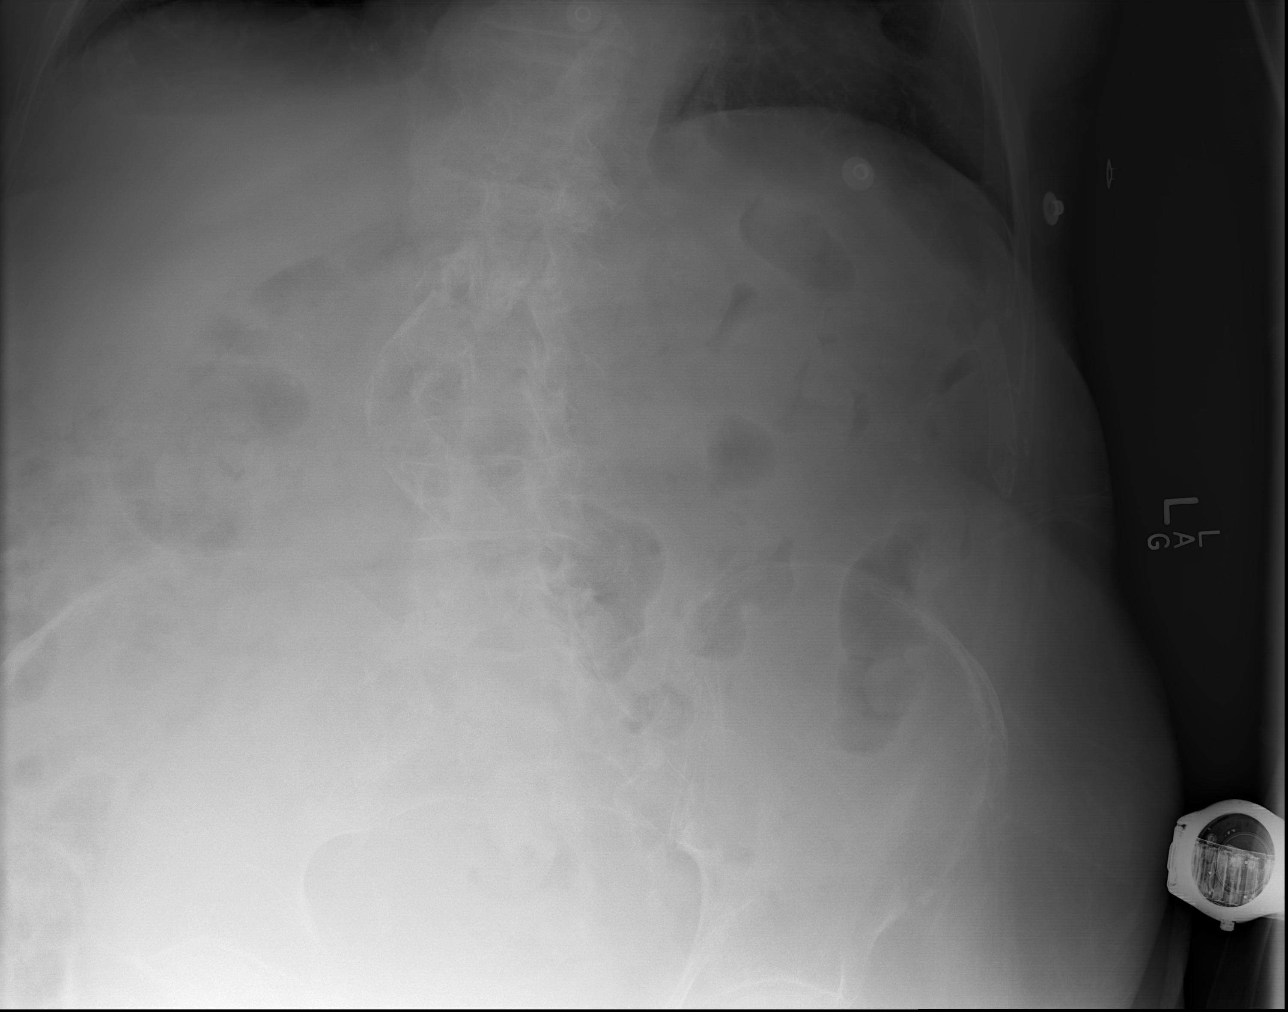

[x abdomen supine (3 of 3)]
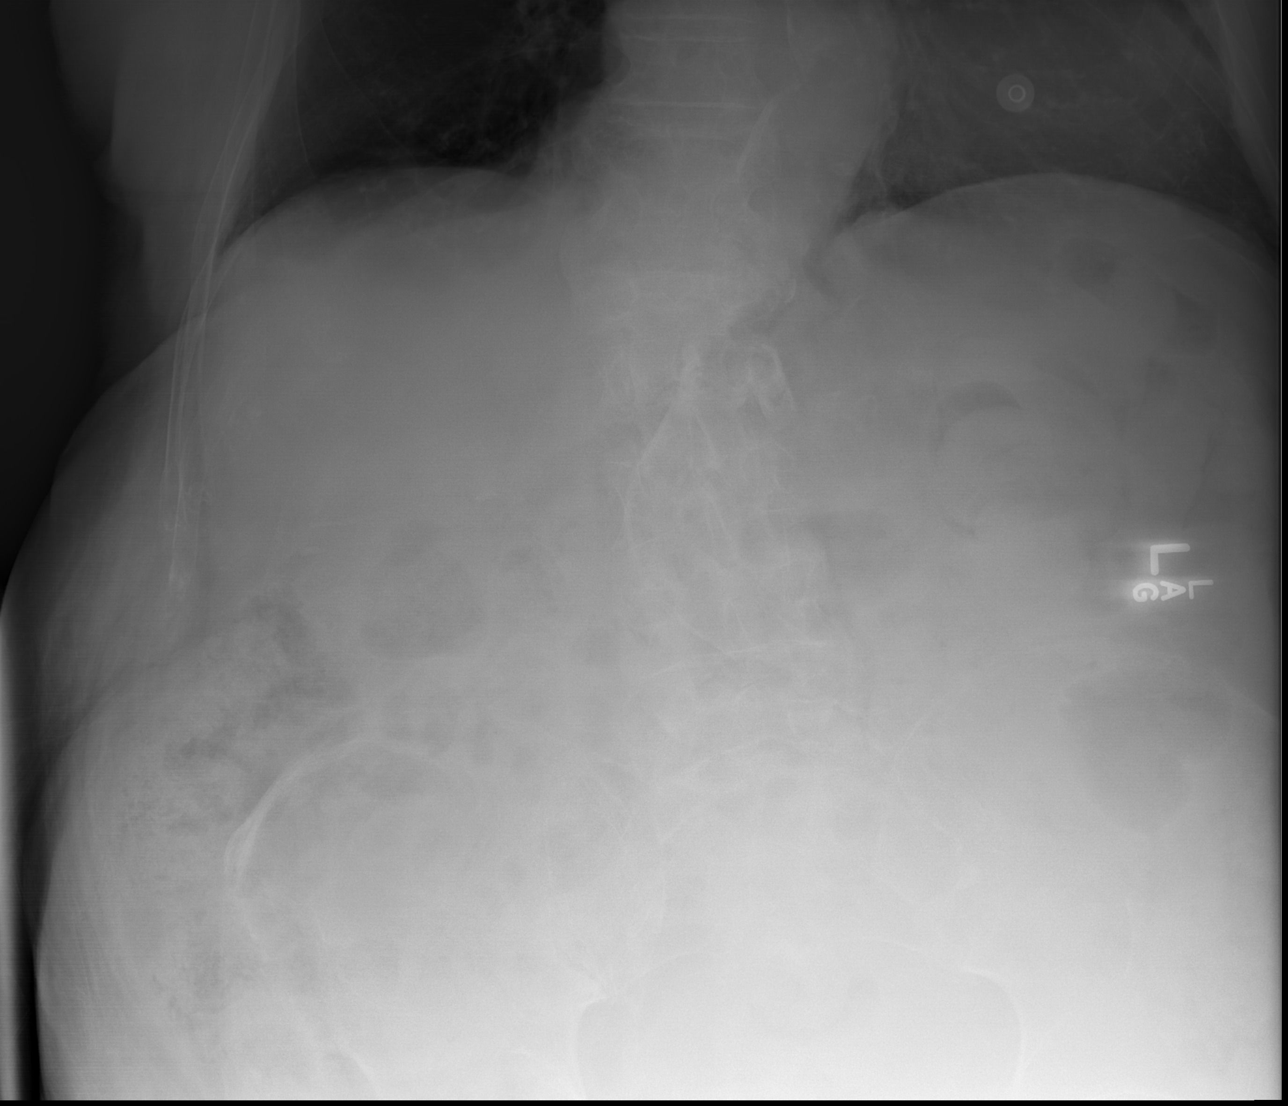

[3 of 3 positions shown; findings below may reference images not displayed]

FINDINGS: The visualized bowel gas pattern is unremarkable. Scattered air and
stool filled loops of colon are seen; no abnormal dilatation of
small bowel loops is seen to suggest small bowel obstruction. No
free intra-abdominal air is identified, though evaluation for free
air is limited on a single supine view.

The visualized osseous structures are within normal limits; the
sacroiliac joints are unremarkable in appearance. The visualized
lung bases are essentially clear. Diffuse calcification is seen
along the abdominal aorta. The abdominal aorta is tortuous in
appearance.
IMPRESSION: 1. Unremarkable bowel gas pattern; no free intra-abdominal air seen.
Moderate amount of stool noted in the colon.
2. Diffuse aortic atherosclerosis. The abdominal aorta is tortuous
in appearance.

## 2018-08-13 IMAGING — CT CT HEAD W/O CM
3 of 4 series · 15 of 47 positions shown, 18 images · non-contrast
Comparison: CT of the head performed 07/28/2014

CLINICAL DATA: Acute onset of altered mental status. Initial
encounter.

EXAM:
CT HEAD WITHOUT CONTRAST
TECHNIQUE: Contiguous axial images were obtained from the base of the skull
through the vertex without intravenous contrast.

[Series 2: head w/o · axial · non-contrast · 0.47mm/px · z∈[+1369,+1514]mm · 9 of 35 slices shown, 12 images]
[im 3/35  brain]
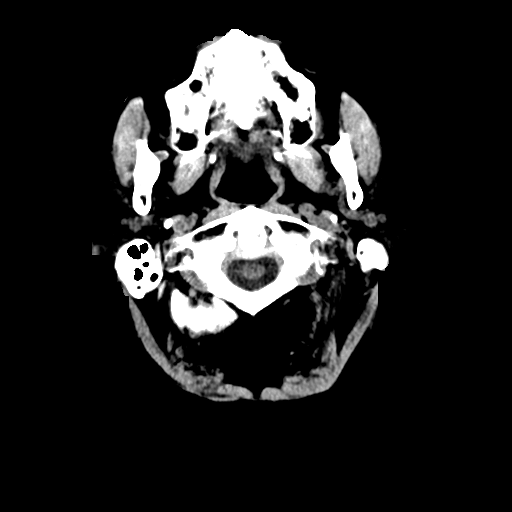
[im 3/35  bone]
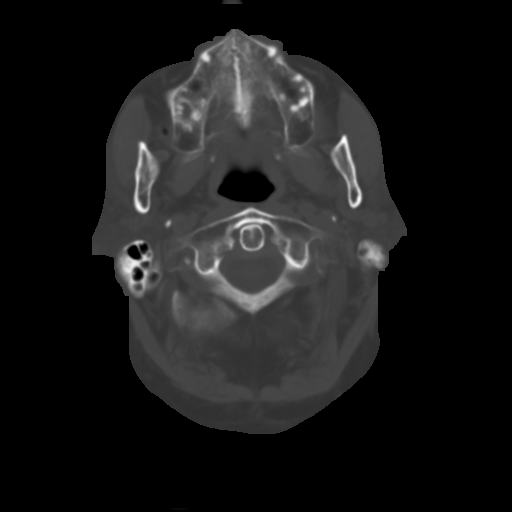
[im 8/35  brain]
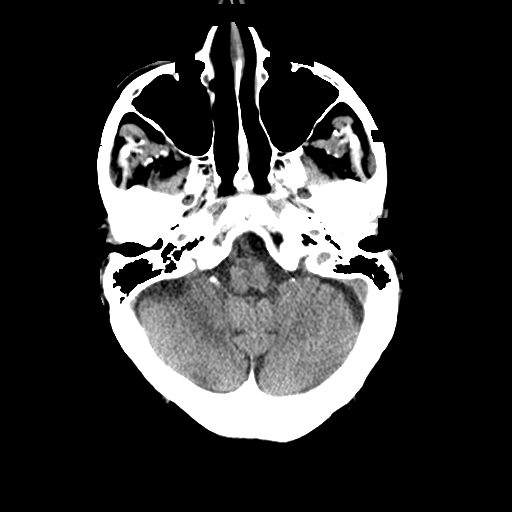
[im 10/35  brain]
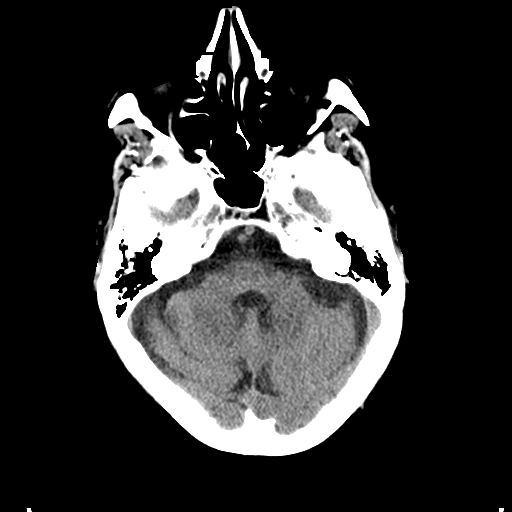
[im 15/35  brain]
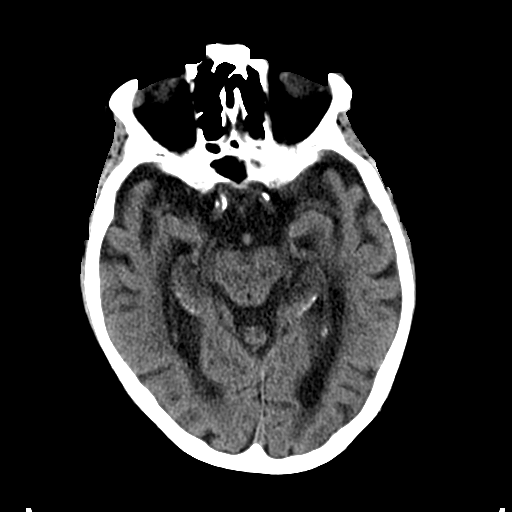
[im 18/35  brain]
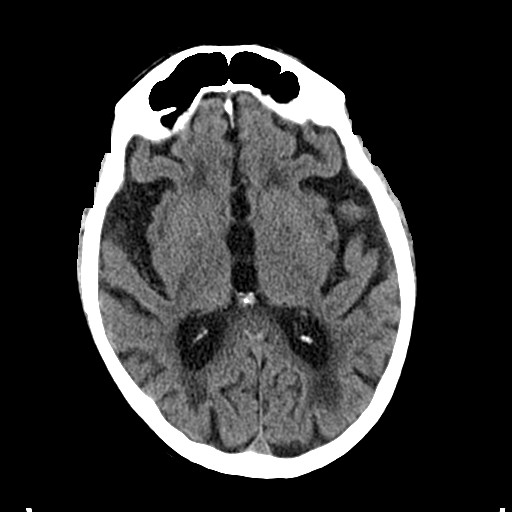
[im 18/35  bone]
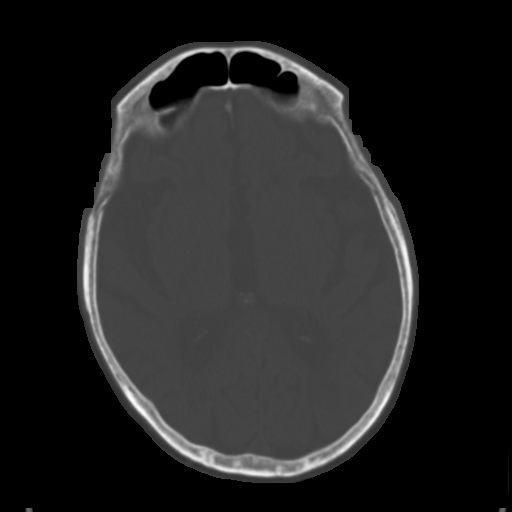
[im 20/35  brain]
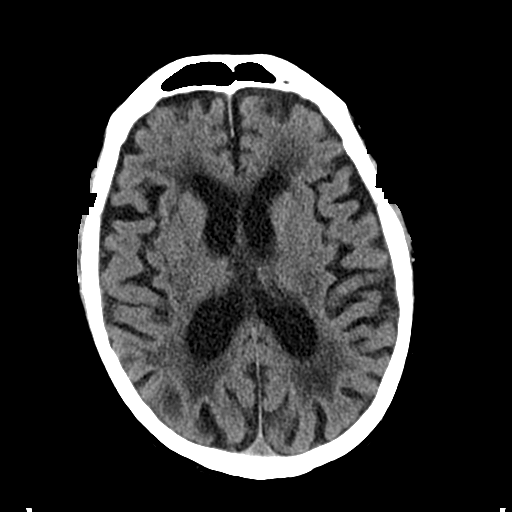
[im 25/35  brain]
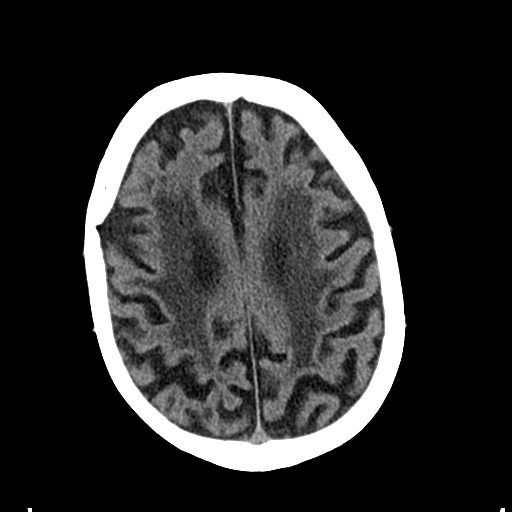
[im 27/35  brain]
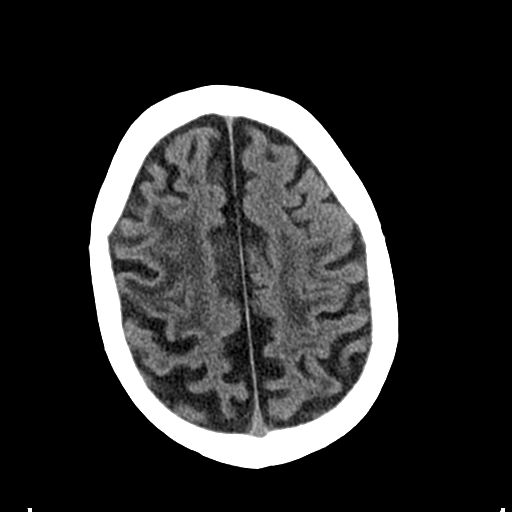
[im 32/35  brain]
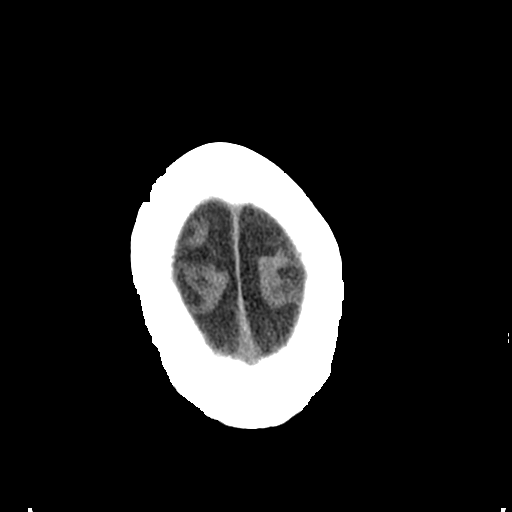
[im 32/35  bone]
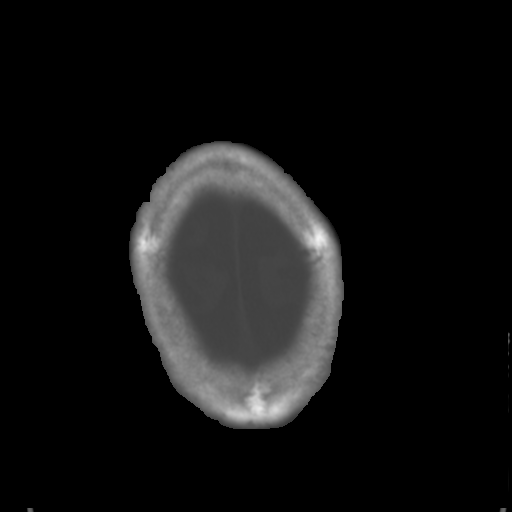

[Series 4: coronal · coronal · 0.32mm/px · 3 of 70 slices shown]
[im 24/70  brain]
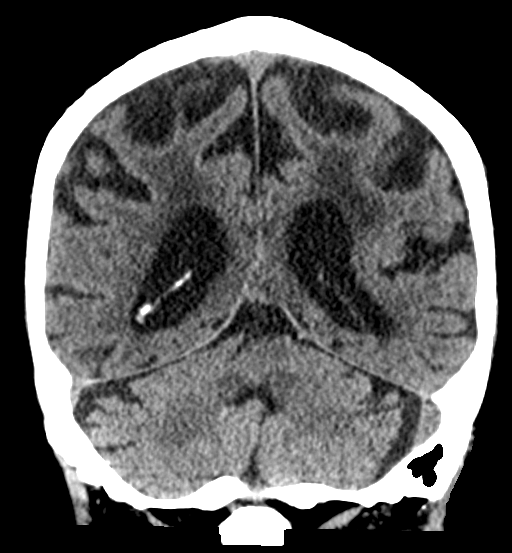
[im 31/70  brain]
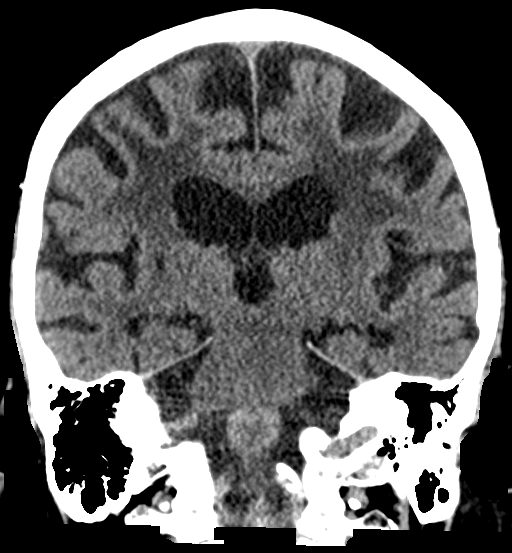
[im 39/70  brain]
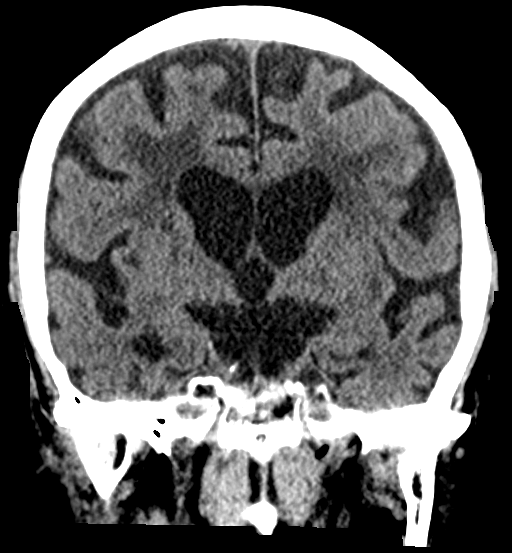

[Series 5: sagittal · sagittal · 0.32mm/px · 3 of 53 slices shown]
[im 18/53  brain]
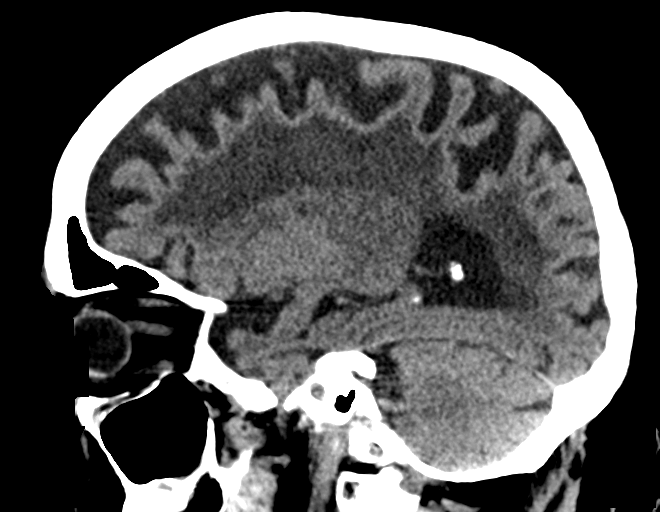
[im 27/53  brain]
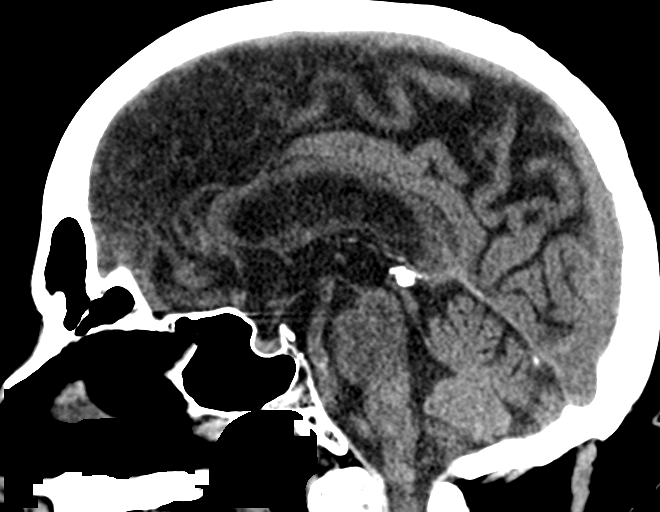
[im 35/53  brain]
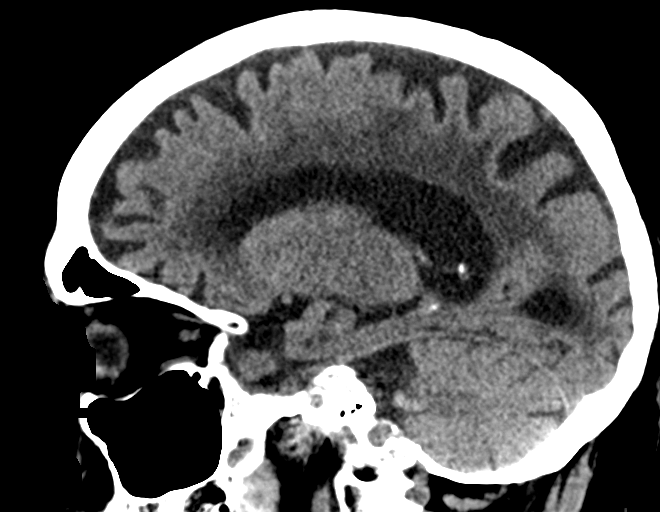

[15 of 47 positions shown; findings below may reference images not displayed]

FINDINGS: Brain: No evidence of acute infarction, hemorrhage, hydrocephalus,
extra-axial collection or mass lesion/mass effect.

Prominence of the ventricles and sulci reflects moderately severe
cortical volume loss. Cerebellar atrophy is noted. Diffuse
periventricular and subcortical white matter change likely reflects
small vessel ischemic microangiopathy. Chronic ischemic change is
seen at the external capsule bilaterally.

The brainstem and fourth ventricle are within normal limits. The
cerebral hemispheres demonstrate grossly normal gray-white
differentiation. No mass effect or midline shift is seen.

Vascular: No hyperdense vessel or unexpected calcification.

Skull: There is no evidence of fracture; visualized osseous
structures are unremarkable in appearance.

Sinuses/Orbits: The orbits are within normal limits. The paranasal
sinuses and mastoid air cells are well-aerated.

Other: No significant soft tissue abnormalities are seen.
IMPRESSION: 1. No acute intracranial pathology seen on CT.
2. Moderately severe cortical volume loss and diffuse small vessel
ischemic microangiopathy.
3. Chronic ischemic change at the external capsule bilaterally.
